# Patient Record
Sex: Female | Born: 1945 | Race: White | Hispanic: No | Marital: Married | Smoking: Never smoker
Health system: Southern US, Community
[De-identification: ages and names within clinical notes are randomized; demographics above are authoritative.]

## PROBLEM LIST (undated history)

## (undated) DIAGNOSIS — R002 Palpitations: Secondary | ICD-10-CM

## (undated) DIAGNOSIS — E039 Hypothyroidism, unspecified: Secondary | ICD-10-CM

## (undated) DIAGNOSIS — R531 Weakness: Secondary | ICD-10-CM

## (undated) DIAGNOSIS — M810 Age-related osteoporosis without current pathological fracture: Secondary | ICD-10-CM

## (undated) DIAGNOSIS — G20C Parkinsonism, unspecified: Secondary | ICD-10-CM

## (undated) DIAGNOSIS — R0609 Other forms of dyspnea: Secondary | ICD-10-CM

## (undated) DIAGNOSIS — R7301 Impaired fasting glucose: Secondary | ICD-10-CM

## (undated) DIAGNOSIS — R739 Hyperglycemia, unspecified: Secondary | ICD-10-CM

## (undated) DIAGNOSIS — G20 Parkinson's disease: Secondary | ICD-10-CM

## (undated) DIAGNOSIS — G20A1 Parkinson's disease without dyskinesia, without mention of fluctuations: Secondary | ICD-10-CM

## (undated) DIAGNOSIS — R0600 Dyspnea, unspecified: Secondary | ICD-10-CM

## (undated) DIAGNOSIS — I493 Ventricular premature depolarization: Secondary | ICD-10-CM

## (undated) DIAGNOSIS — E063 Autoimmune thyroiditis: Secondary | ICD-10-CM

## (undated) DIAGNOSIS — L659 Nonscarring hair loss, unspecified: Secondary | ICD-10-CM

## (undated) HISTORY — DX: Parkinsonism, unspecified: G20.C

## (undated) HISTORY — DX: Hyperglycemia, unspecified: R73.9

## (undated) HISTORY — DX: Ventricular premature depolarization: I49.3

## (undated) HISTORY — DX: Hypothyroidism, unspecified: E03.9

## (undated) HISTORY — DX: Autoimmune thyroiditis: E06.3

## (undated) HISTORY — DX: Parkinson's disease: G20

## (undated) HISTORY — DX: Impaired fasting glucose: R73.01

## (undated) HISTORY — PX: CHOLECYSTECTOMY: SHX55

## (undated) HISTORY — DX: Nonscarring hair loss, unspecified: L65.9

## (undated) HISTORY — DX: Palpitations: R00.2

## (undated) HISTORY — DX: Parkinson's disease without dyskinesia, without mention of fluctuations: G20.A1

## (undated) HISTORY — DX: Age-related osteoporosis without current pathological fracture: M81.0

## (undated) HISTORY — DX: Dyspnea, unspecified: R06.00

## (undated) HISTORY — DX: Weakness: R53.1

## (undated) HISTORY — PX: CATARACT EXTRACTION: SUR2

## (undated) HISTORY — DX: Other forms of dyspnea: R06.09

---

## 1997-03-11 HISTORY — PX: WRIST SURGERY: SHX841

## 2016-01-15 HISTORY — PX: FOOT SURGERY: SHX648

## 2017-01-24 ENCOUNTER — Encounter: Payer: Self-pay | Admitting: *Deleted

## 2017-01-27 ENCOUNTER — Encounter: Payer: Self-pay | Admitting: Neurology

## 2017-01-27 ENCOUNTER — Ambulatory Visit (INDEPENDENT_AMBULATORY_CARE_PROVIDER_SITE_OTHER): Payer: Medicare Other | Admitting: Neurology

## 2017-01-27 ENCOUNTER — Encounter (INDEPENDENT_AMBULATORY_CARE_PROVIDER_SITE_OTHER): Payer: Self-pay

## 2017-01-27 ENCOUNTER — Telehealth: Payer: Self-pay | Admitting: Neurology

## 2017-01-27 DIAGNOSIS — G20 Parkinson's disease: Secondary | ICD-10-CM | POA: Diagnosis not present

## 2017-01-27 MED ORDER — AMANTADINE HCL 100 MG PO CAPS: 100.0000 mg | ORAL_CAPSULE | Freq: Two times a day (BID) | ORAL | 1 refills | Status: DC

## 2017-01-27 MED ORDER — CARBIDOPA-LEVODOPA 25-100 MG PO TABS: 1.0000 | ORAL_TABLET | Freq: Every day | ORAL | 1 refills | Status: DC

## 2017-01-27 NOTE — Progress Notes (Signed)
Reason for visit: Parkinson's disease  Referring physician: Dr. Maryellen Pile is a 71 y.o. female  History of present illness:  Ms. Barsanti is a 71 year old right-handed white female with a history of Parkinson's disease dating back about 9 or 10 years.  The patient believes he was around 71 years old when she was told she had a diagnosis of Parkinson's disease.  The symptoms initially began with a tremor that involved the left foot, and she has also eventually had some tremors involving both hands, right greater than left.  The patient has had a tight sensation in the hand and sloppy handwriting.  The patient has gradually been placed on a multitude of medications for Parkinson's disease including Azilect, amantadine, Sinemet, and Mirapex.  The patient denies any severe problems with walking, she has stumbled on occasion when she has caught her foot on something.  The patient denies any problems with speech or swallowing.  She does note a mild problem with memory.  She has never had MRI evaluation of the brain.  The patient denies any low back pain, but she does have some discomfort in the legs that occurs with sitting, in the past this has improved with walking, but this is no longer the case.  The patient may have some nightmares, occasionally she may fall out of bed.  The neck at times feels tight.  She indicates that her mother also had a tremor similar to hers, but her mother was never diagnosed with Parkinson's disease.  The patient has recently moved from [place] to this area, she seeks a new neurologist.  Past Medical History:  Diagnosis Date  . Dyspnea on exertion   . Hashimoto's thyroiditis   . Hypothyroidism   . Impaired fasting blood sugar   . Palpitations   . Parkinson's disease ([place])   . Parkinsonism ([place])   . Ventricular premature beats     Past Surgical History:  Procedure Laterality Date  . CATARACT EXTRACTION Bilateral   . CESAREAN SECTION     x2  .  CHOLECYSTECTOMY    . FOOT SURGERY Right 01/15/2016    Family History  Problem Relation Age of Onset  . Breast cancer Mother   . Lung disease Brother     Social history:  reports that  has never smoked. she has never used smokeless tobacco. She reports that she does not drink alcohol or use drugs.  Medications:  Prior to Admission medications   Medication Sig Start Date End Date Taking? Authorizing Provider  amantadine (SYMMETREL) 100 MG capsule Take 100 mg 2 (two) times daily by mouth.   Yes [provider]  carbidopa-levodopa (SINEMET IR) 25-100 MG tablet Take 1 tablet 5 (five) times daily by mouth.   Yes [provider]  Carbidopa-Levodopa ER (SINEMET CR) 25-100 MG tablet controlled release Take 1 tablet at bedtime by mouth.   Yes [provider]  levothyroxine (SYNTHROID, LEVOTHROID) 50 MCG tablet Take 50 mcg daily before breakfast by mouth.   Yes [provider]  pramipexole (MIRAPEX) 1.5 MG tablet Take 1.5 mg 3 (three) times daily by mouth.   Yes [provider]  rasagiline (AZILECT) 0.5 MG TABS tablet Take 1 mg daily by mouth.   Yes [provider]     No Known Allergies  ROS:  Out of a complete 14 system review of symptoms, the patient complains only of the following symptoms, and all other reviewed systems are negative.  Tremor  Blood  pressure 115/60, pulse 85, height 5\' 2"  (1.575 m), weight 145 lb (65.8 kg), SpO2 96 %.  Physical Exam  General: The patient is alert and cooperative at the time of the examination.  Eyes: Pupils are equal, round, and reactive to light. Discs are flat bilaterally.  Neck: The neck is supple, no carotid bruits are noted.  Respiratory: The respiratory examination is clear.  Cardiovascular: The cardiovascular examination reveals a regular rate and rhythm, no obvious murmurs or rubs are noted.  Skin: Extremities are without significant edema.  Neurologic Exam  Mental status: The  patient is alert and oriented x 3 at the time of the examination. The patient has apparent normal recent and remote memory, with an apparently normal attention span and concentration ability.  Cranial nerves: Facial symmetry is present. There is good sensation of the face to pinprick and soft touch bilaterally. The strength of the facial muscles and the muscles to head turning and shoulder shrug are normal bilaterally. Speech is well enunciated, no aphasia or dysarthria is noted. Extraocular movements are full. Visual fields are full. The tongue is midline, and the patient has symmetric elevation of the soft palate. No obvious hearing deficits are noted.  The patient appears to have good facial animation.  The patient uses her arms and hands with speaking.  Motor: The motor testing reveals 5 over 5 strength of all 4 extremities. Good symmetric motor tone is noted throughout.  Sensory: Sensory testing is intact to pinprick, soft touch, vibration sensation, and position sense on all 4 extremities. No evidence of extinction is noted.  Coordination: Cerebellar testing reveals good finger-nose-finger and heel-to-shin bilaterally.  Gait and station: Gait is normal.  The patient has good arm swing bilaterally with walking, she is able to arise from a seated position with arms crossed easily.  Tandem gait is normal. Romberg is negative. No drift is seen.  Reflexes: Deep tendon reflexes are symmetric and normal bilaterally. Toes are downgoing bilaterally.   Assessment/Plan:  1.  History of Parkinson's disease  The clinical examination today appears to be normal, I see no evidence of any parkinsonian features.  At times, the symptoms of Parkinson's disease can be completely suppressed with the use of medications, but the fact that this patient appears to be normal after 9 or 10 years of treatment makes the diagnosis of Parkinson's disease suspect.  The patient will go off the Azilect, she will be set up for  a DAT scan.  If the scan is normal, we will taper her off of all of her medications for Parkinson's disease slowly.  The patient will follow-up in 6 months.  The patient will be referred to an internal medicine physician in this area.  Currently she has no primary care physician.   Jill Alexanders MD 01/27/2017 9:17 AM  [facility] [address] [place], [place] [address]  Phone [phone] Fax [phone]

## 2017-01-27 NOTE — Patient Instructions (Signed)
Stop the Azelect now. We will get a DAT scan.

## 2017-01-27 NOTE — Telephone Encounter (Signed)
DAT Scan Patient is ready to be scheduled for DAT Scan No approval is needed . Thanks Hinton Dyer

## 2017-01-29 ENCOUNTER — Telehealth: Payer: Self-pay | Admitting: Radiology

## 2017-02-25 ENCOUNTER — Encounter (HOSPITAL_COMMUNITY): Payer: Self-pay | Admitting: *Deleted

## 2017-02-25 ENCOUNTER — Emergency Department (HOSPITAL_COMMUNITY): Payer: Medicare Other

## 2017-02-25 DIAGNOSIS — E039 Hypothyroidism, unspecified: Secondary | ICD-10-CM | POA: Diagnosis not present

## 2017-02-25 DIAGNOSIS — R531 Weakness: Secondary | ICD-10-CM | POA: Insufficient documentation

## 2017-02-25 DIAGNOSIS — R319 Hematuria, unspecified: Secondary | ICD-10-CM | POA: Diagnosis not present

## 2017-02-25 DIAGNOSIS — R1084 Generalized abdominal pain: Secondary | ICD-10-CM | POA: Diagnosis not present

## 2017-02-25 DIAGNOSIS — J101 Influenza due to other identified influenza virus with other respiratory manifestations: Secondary | ICD-10-CM | POA: Insufficient documentation

## 2017-02-25 DIAGNOSIS — Z79899 Other long term (current) drug therapy: Secondary | ICD-10-CM | POA: Insufficient documentation

## 2017-02-25 DIAGNOSIS — R509 Fever, unspecified: Secondary | ICD-10-CM | POA: Diagnosis present

## 2017-02-25 DIAGNOSIS — G20 Parkinson's disease: Secondary | ICD-10-CM | POA: Insufficient documentation

## 2017-02-25 DIAGNOSIS — J111 Influenza due to unidentified influenza virus with other respiratory manifestations: Secondary | ICD-10-CM | POA: Diagnosis not present

## 2017-02-25 LAB — URINALYSIS, ROUTINE W REFLEX MICROSCOPIC
BACTERIA UA: NONE SEEN
BILIRUBIN URINE: NEGATIVE
Glucose, UA: NEGATIVE mg/dL
KETONES UR: 5 mg/dL — AB
LEUKOCYTES UA: NEGATIVE
NITRITE: NEGATIVE
Protein, ur: NEGATIVE mg/dL
Specific Gravity, Urine: 1.018 (ref 1.005–1.030)
Squamous Epithelial / LPF: NONE SEEN
pH: 6 (ref 5.0–8.0)

## 2017-02-25 LAB — CBC WITH DIFFERENTIAL/PLATELET
Basophils Absolute: 0 10*3/uL (ref 0.0–0.1)
Basophils Relative: 0 %
EOS PCT: 0 %
Eosinophils Absolute: 0 10*3/uL (ref 0.0–0.7)
HCT: 40.9 % (ref 36.0–46.0)
HEMOGLOBIN: 13.6 g/dL (ref 12.0–15.0)
LYMPHS ABS: 0.7 10*3/uL (ref 0.7–4.0)
LYMPHS PCT: 7 %
MCH: 28.9 pg (ref 26.0–34.0)
MCHC: 33.3 g/dL (ref 30.0–36.0)
MCV: 86.8 fL (ref 78.0–100.0)
MONOS PCT: 8 %
Monocytes Absolute: 0.8 10*3/uL (ref 0.1–1.0)
Neutro Abs: 8.6 10*3/uL — ABNORMAL HIGH (ref 1.7–7.7)
Neutrophils Relative %: 85 %
PLATELETS: 201 10*3/uL (ref 150–400)
RBC: 4.71 MIL/uL (ref 3.87–5.11)
RDW: 13.7 % (ref 11.5–15.5)
WBC: 10.1 10*3/uL (ref 4.0–10.5)

## 2017-02-25 LAB — COMPREHENSIVE METABOLIC PANEL
ALBUMIN: 4 g/dL (ref 3.5–5.0)
ALK PHOS: 109 U/L (ref 38–126)
ALT: 26 U/L (ref 14–54)
AST: 25 U/L (ref 15–41)
Anion gap: 10 (ref 5–15)
BUN: 10 mg/dL (ref 6–20)
CHLORIDE: 104 mmol/L (ref 101–111)
CO2: 22 mmol/L (ref 22–32)
CREATININE: 0.78 mg/dL (ref 0.44–1.00)
Calcium: 8.6 mg/dL — ABNORMAL LOW (ref 8.9–10.3)
GFR calc non Af Amer: >60 mL/min (ref >60)
GLUCOSE: 125 mg/dL — AB (ref 65–99)
Potassium: 3.7 mmol/L (ref 3.5–5.1)
SODIUM: 136 mmol/L (ref 135–145)
Total Bilirubin: 0.5 mg/dL (ref 0.3–1.2)
Total Protein: 7 g/dL (ref 6.5–8.1)

## 2017-02-25 LAB — I-STAT CG4 LACTIC ACID, ED: Lactic Acid, Venous: 0.99 mmol/L (ref 0.5–1.9)

## 2017-02-25 LAB — PROTIME-INR
INR: 1.1
Prothrombin Time: 14.1 seconds (ref 11.4–15.2)

## 2017-02-25 NOTE — ED Triage Notes (Signed)
Pt arrives with her family from home.  Family says she has been confused and had a fever 101  Today. Had tylenol for the same.  +cough. She has been confused enough that she forgot to take her parkinson meds "1 or 2 times" and now her shaking is worse and painful.

## 2017-02-26 ENCOUNTER — Emergency Department (HOSPITAL_COMMUNITY)
Admission: EM | Admit: 2017-02-26 | Discharge: 2017-02-26 | Disposition: A | Discharge status: Home / Self Care | Payer: Medicare Other | Attending: Emergency Medicine | Admitting: Emergency Medicine

## 2017-02-26 DIAGNOSIS — R319 Hematuria, unspecified: Secondary | ICD-10-CM

## 2017-02-26 DIAGNOSIS — J101 Influenza due to other identified influenza virus with other respiratory manifestations: Secondary | ICD-10-CM | POA: Diagnosis not present

## 2017-02-26 DIAGNOSIS — R531 Weakness: Secondary | ICD-10-CM

## 2017-02-26 LAB — INFLUENZA PANEL BY PCR (TYPE A & B)
INFLAPCR: POSITIVE — AB
Influenza B By PCR: NEGATIVE

## 2017-02-26 LAB — CBC
HCT: 42.6 % (ref 36.0–46.0)
HEMOGLOBIN: 14.2 g/dL (ref 12.0–15.0)
MCH: 29.5 pg (ref 26.0–34.0)
MCHC: 33.3 g/dL (ref 30.0–36.0)
MCV: 88.4 fL (ref 78.0–100.0)
Platelets: 194 10*3/uL (ref 150–400)
RBC: 4.82 MIL/uL (ref 3.87–5.11)
RDW: 14.1 % (ref 11.5–15.5)
WBC: 7.6 10*3/uL (ref 4.0–10.5)

## 2017-02-26 LAB — I-STAT CG4 LACTIC ACID, ED: Lactic Acid, Venous: 1.36 mmol/L (ref 0.5–1.9)

## 2017-02-26 MED ORDER — SODIUM CHLORIDE 0.9 % IV BOLUS (SEPSIS)
1000.0000 mL | Freq: Once | INTRAVENOUS | Status: AC
  Administered 2017-02-26: 1000 mL via INTRAVENOUS

## 2017-02-26 MED ORDER — CEPHALEXIN 500 MG PO CAPS: 1000.0000 mg | ORAL_CAPSULE | Freq: Two times a day (BID) | ORAL | 0 refills | Status: DC

## 2017-02-26 MED ORDER — OSELTAMIVIR PHOSPHATE 75 MG PO CAPS
75.0000 mg | ORAL_CAPSULE | Freq: Once | ORAL | Status: AC
  Administered 2017-02-26: 75 mg via ORAL
  Filled 2017-02-26: qty 1

## 2017-02-26 MED ORDER — IBUPROFEN 800 MG PO TABS: 800.0000 mg | ORAL_TABLET | Freq: Three times a day (TID) | ORAL | 0 refills | Status: DC

## 2017-02-26 MED ORDER — ACETAMINOPHEN 500 MG PO TABS
1000.0000 mg | ORAL_TABLET | Freq: Once | ORAL | Status: AC
  Administered 2017-02-26: 1000 mg via ORAL
  Filled 2017-02-26: qty 2

## 2017-02-26 MED ORDER — OSELTAMIVIR PHOSPHATE 75 MG PO CAPS: 75.0000 mg | ORAL_CAPSULE | Freq: Two times a day (BID) | ORAL | 0 refills | Status: DC

## 2017-02-26 MED ORDER — ACETAMINOPHEN 500 MG PO TABS: 1000.0000 mg | ORAL_TABLET | Freq: Four times a day (QID) | ORAL | 0 refills | Status: DC | PRN

## 2017-02-26 MED ORDER — SODIUM CHLORIDE 0.9 % IV SOLN
1000.0000 mL | INTRAVENOUS | Status: DC
  Administered 2017-02-26: 1000 mL via INTRAVENOUS

## 2017-02-26 MED ORDER — IBUPROFEN 800 MG PO TABS
800.0000 mg | ORAL_TABLET | Freq: Once | ORAL | Status: AC
Start: 2017-02-26 — End: 2017-02-26
  Administered 2017-02-26: 800 mg via ORAL
  Filled 2017-02-26: qty 1

## 2017-02-26 MED ORDER — DEXTROSE 5 % IV SOLN
1.0000 g | Freq: Once | INTRAVENOUS | Status: AC
  Administered 2017-02-26: 1 g via INTRAVENOUS
  Filled 2017-02-26: qty 10

## 2017-02-26 NOTE — ED Notes (Signed)
ED Provider at bedside. 

## 2017-02-26 NOTE — ED Notes (Signed)
Rechecked pt O2 stats with a different machine. O2 stats stay at 100%.

## 2017-02-26 NOTE — ED Provider Notes (Signed)
[facility] Provider Note   CSN: [id number] Arrival date & time: 02/25/17  2048     History   Chief Complaint Chief Complaint  Patient presents with  . Fever    HPI [facility] [name] is a 71 y.o. female.  HPI Patient has been sick for about 2 days.  Symptoms started with some "tickle" in the throat and dry cough.  She developed fever at home.  T-max 101.  Family members report yesterday evening she showed signs of confusion.  She could not recall if he taken medications and seemed very weak and having difficulty walking.  Patient endorses she has generalized aching all over.  No nausea or vomiting.  No pain burning urgency with urination.  I did note she has red blood cells in the urine.  When asked about that patient reports that once before that seem to be the case and she got treated for a urinary tract infection.  Patient is diagnosed with Parkinson's disease which she has had for reportedly 8 years.  She reports that contributes a lot to certain problems she has with musculoskeletal pain and weakness. Past Medical History:  Diagnosis Date  . Dyspnea on exertion   . Hashimoto's thyroiditis   . Hypothyroidism   . Impaired fasting blood sugar   . Palpitations   . Parkinson's disease ([place])   . Parkinsonism ([place])   . Ventricular premature beats     Patient Active Problem List   Diagnosis Date Noted  . Parkinson's disease ([place]) 01/27/2017    Past Surgical History:  Procedure Laterality Date  . CATARACT EXTRACTION Bilateral   . CESAREAN SECTION     x2  . CHOLECYSTECTOMY    . FOOT SURGERY Right 01/15/2016    OB History    No data available       Home Medications    Prior to Admission medications   Medication Sig Start Date End Date Taking? Authorizing Provider  acetaminophen (TYLENOL) 325 MG tablet Take 650-975 mg by mouth every 6 (six) hours as needed for moderate pain.   Yes [provider]  amantadine (SYMMETREL)  100 MG capsule Take 1 capsule (100 mg total) 2 (two) times daily by mouth. 01/27/17  Yes Kathrynn Ducking, MD  carbidopa-levodopa (SINEMET IR) 25-100 MG tablet Take 1 tablet 5 (five) times daily by mouth. 01/27/17  Yes Kathrynn Ducking, MD  Carbidopa-Levodopa ER (SINEMET CR) 25-100 MG tablet controlled release Take 1 tablet at bedtime by mouth.   Yes [provider]  Carboxymethylcellul-Glycerin (CLEAR EYES FOR DRY EYES) 1-0.25 % SOLN Place 1 drop into both eyes daily as needed (dry eyes).   Yes [provider]  levothyroxine (SYNTHROID, LEVOTHROID) 50 MCG tablet Take 50 mcg daily before breakfast by mouth.   Yes [provider]  pramipexole (MIRAPEX) 1.5 MG tablet Take 1.5 mg 3 (three) times daily by mouth.   Yes [provider]  acetaminophen (TYLENOL) 500 MG tablet Take 2 tablets (1,000 mg total) by mouth every 6 (six) hours as needed. 02/26/17   Charlesetta Shanks, MD  cephALEXin (KEFLEX) 500 MG capsule Take 2 capsules (1,000 mg total) by mouth 2 (two) times daily. 02/26/17   Charlesetta Shanks, MD  ibuprofen (ADVIL,MOTRIN) 800 MG tablet Take 1 tablet (800 mg total) by mouth 3 (three) times daily. 02/26/17   Charlesetta Shanks, MD  oseltamivir (TAMIFLU) 75 MG capsule Take 1 capsule (75 mg total) by mouth every 12 (twelve) hours. 02/26/17   Casia Corti,  Jeannie Done, MD    Family History Family History  Problem Relation Age of Onset  . Breast cancer Mother   . Lung disease Brother     Social History Social History   Tobacco Use  . Smoking status: Never Smoker  . Smokeless tobacco: Never Used  Substance Use Topics  . Alcohol use: No    Frequency: Never  . Drug use: No     Allergies   Patient has no known allergies.   Review of Systems Review of Systems 10 Systems reviewed and are negative for acute change except as noted in the HPI.   Physical Exam Updated Vital Signs BP 113/63   Pulse 89   Temp 98.3 F (36.8 C) (Oral)   Resp 19   SpO2 96%    Physical Exam  Constitutional: She is oriented to person, place, and time. She appears well-developed and well-nourished. No distress.  HENT:  Head: Normocephalic and atraumatic.  Nose: Nose normal.  Mouth/Throat: Oropharynx is clear and moist.  Eyes: Conjunctivae and EOM are normal. Pupils are equal, round, and reactive to light.  Neck: Neck supple.  Cardiovascular: Normal rate, regular rhythm, normal heart sounds and intact distal pulses.  No murmur heard. Pulmonary/Chest: Effort normal and breath sounds normal. No respiratory distress.  Abdominal: Soft. She exhibits no distension. There is no tenderness. There is no guarding.  Musculoskeletal: Normal range of motion. She exhibits no edema or tenderness.  Neurological: She is alert and oriented to person, place, and time. No cranial nerve deficit. She exhibits normal muscle tone. Coordination normal.  Skin: Skin is warm and dry.  Psychiatric: She has a normal mood and affect.  Nursing note and vitals reviewed.    ED Treatments / Results  Labs (all labs ordered are listed, but only abnormal results are displayed) Labs Reviewed  COMPREHENSIVE METABOLIC PANEL - Abnormal; Notable for the following components:      Result Value   Glucose, Bld 125 (*)    Calcium 8.6 (*)    All other components within normal limits  CBC WITH DIFFERENTIAL/PLATELET - Abnormal; Notable for the following components:   Neutro Abs 8.6 (*)    All other components within normal limits  URINALYSIS, ROUTINE W REFLEX MICROSCOPIC - Abnormal; Notable for the following components:   Hgb urine dipstick MODERATE (*)    Ketones, ur 5 (*)    All other components within normal limits  INFLUENZA PANEL BY PCR (TYPE A & B) - Abnormal; Notable for the following components:   Influenza A By PCR POSITIVE (*)    All other components within normal limits  CULTURE, BLOOD (ROUTINE X 2)  CULTURE, BLOOD (ROUTINE X 2)  URINE CULTURE  PROTIME-INR  CBC  I-STAT CG4 LACTIC  ACID, ED  I-STAT CG4 LACTIC ACID, ED  I-STAT CG4 LACTIC ACID, ED  I-STAT CG4 LACTIC ACID, ED    EKG  EKG Interpretation None       Radiology Dg Chest 2 View  Result Date: 02/25/2017 CLINICAL DATA:  Fever.  Altered mental status. EXAM: CHEST  2 VIEW COMPARISON:  None. FINDINGS: The cardiomediastinal contours are normal. The lungs are clear. Pulmonary vasculature is normal. No consolidation, pleural effusion, or pneumothorax. No acute osseous abnormalities are seen. IMPRESSION: No acute pulmonary process. Electronically Signed   By: Jeb Levering M.D.   On: 02/25/2017 22:03    Procedures Procedures (including critical care time)  Medications Ordered in ED Medications  sodium chloride 0.9 % bolus 1,000 mL (0 mLs  Intravenous Stopped 02/26/17 0933)    Followed by  0.9 %  sodium chloride infusion (1,000 mLs Intravenous New Bag/Given 02/26/17 2979)  acetaminophen (TYLENOL) tablet 1,000 mg (1,000 mg Oral Given 02/26/17 0903)  cefTRIAXone (ROCEPHIN) 1 g in dextrose 5 % 50 mL IVPB (0 g Intravenous Stopped 02/26/17 0933)  oseltamivir (TAMIFLU) capsule 75 mg (75 mg Oral Given 02/26/17 1321)  ibuprofen (ADVIL,MOTRIN) tablet 800 mg (800 mg Oral Given 02/26/17 1321)     Initial Impression / Assessment and Plan / ED Course  I have reviewed the triage vital signs and the nursing notes.  Pertinent labs & imaging results that were available during my care of the patient were reviewed by me and considered in my medical decision making (see chart for details).      Final Clinical Impressions(s) / ED Diagnoses   Final diagnoses:  Influenza A  Hematuria, unspecified type  Generalized weakness   Patient feels much better after rehydration and antipyretics.  She is alert and appropriate.  Mental status is clear.  No respiratory distress.  Vital signs stable.  At this time symptoms are consistent with confirmed influenza A.  Patient started on Tamiflu.  She also had finding of hematuria.   At this time etiology is unclear.  Consideration was given to possible UTI manifesting first with hematuria, Rocephin 1 g IV administered.  Patient is counseled on following up on her urine culture to determine if she needs ongoing antibiotics.  Keflex is prescribed to continue tomorrow.  I reviewed all this with the patient and she is alert and appropriate.  I advised I was certainly willing to come back when her husband and son return as they had been very involved in her care earlier.  Patient insisted she would relay all this to her family members and although her son meant well, she felt he was overly enthusiastic in his involvement in her medical care.  Patient is counseled on the nature of influenza and that if symptoms are worsening, she may still require hospitalization, and certainly to return if she was not improving with treatment. ED Discharge Orders        Ordered    oseltamivir (TAMIFLU) 75 MG capsule  Every 12 hours     02/26/17 1315    cephALEXin (KEFLEX) 500 MG capsule  2 times daily     02/26/17 1315    ibuprofen (ADVIL,MOTRIN) 800 MG tablet  3 times daily     02/26/17 1320    acetaminophen (TYLENOL) 500 MG tablet  Every 6 hours PRN     02/26/17 1320       Charlesetta Shanks, MD 02/26/17 1352

## 2017-02-26 NOTE — Discharge Instructions (Signed)
1.  You have influenza A.  Take ibuprofen and Tylenol to control fever and body aches. 2.  You also have blood in your urine which may represent urinary tract infection.  You have been given a dose of Rocephin in the emergency department.  You would be due to start Keflex (your oral antibiotic) tomorrow at 8 AM.  Try to follow-up on the results of your urine culture to determine if you need ongoing antibiotics.  It is most likely, that your symptoms are due to your influenza. 3.  Call your primary care doctor's office tomorrow today to let them know you were seen in the emergency department and should have a recheck as well as requesting a follow-up check on your urine culture results. 4.  Return to the emergency department if your symptoms are worsening.  Many people can be treated at home for influenza but some people get seriously ill and have to be hospitalized.

## 2017-02-26 NOTE — ED Notes (Signed)
Placed with external female catheter.

## 2017-02-27 LAB — URINE CULTURE: CULTURE: NO GROWTH

## 2017-03-02 LAB — CULTURE, BLOOD (ROUTINE X 2)
CULTURE: NO GROWTH
Culture: NO GROWTH

## 2017-03-06 ENCOUNTER — Encounter (HOSPITAL_COMMUNITY): Payer: Medicare Other

## 2017-03-06 ENCOUNTER — Other Ambulatory Visit (HOSPITAL_COMMUNITY): Payer: Self-pay

## 2017-03-07 ENCOUNTER — Ambulatory Visit: Payer: Self-pay | Admitting: Neurology

## 2017-03-20 ENCOUNTER — Ambulatory Visit (HOSPITAL_COMMUNITY)
Admission: RE | Admit: 2017-03-20 | Discharge: 2017-03-20 | Disposition: A | Discharge status: Home / Self Care | Payer: Medicare Other | Source: Ambulatory Visit | Attending: Neurology | Admitting: Neurology

## 2017-03-20 ENCOUNTER — Telehealth: Payer: Self-pay | Admitting: Neurology

## 2017-03-20 DIAGNOSIS — G20 Parkinson's disease: Secondary | ICD-10-CM | POA: Insufficient documentation

## 2017-03-20 MED ORDER — IOFLUPANE I 123 185 MBQ/2.5ML IV SOLN
4.8000 | Freq: Once | INTRAVENOUS | Status: AC
  Administered 2017-03-20: 4.8 via INTRAVENOUS

## 2017-03-20 MED ORDER — IODINE STRONG (LUGOLS) 5 % PO SOLN
0.8000 mL | Freq: Once | ORAL | Status: AC
  Administered 2017-03-20: 0.8 mL via ORAL
  Filled 2017-03-20: qty 0.8

## 2017-03-20 NOTE — Telephone Encounter (Signed)
  I called the patient.  The Dat scan suggested that she does have Parkinson's disease.  The patient reports episodes of shortness of breath and tremors that may occur randomly throughout the day.  She may try cutting back on the Sinemet IR tablets taking 1 4 times a day rather than 1 5 times a day, she is to keep track of the frequency of these events.  The patient indicates that she is on Comtan as well, I do not have this on her list of medications.   DAT scan 03/20/17:  IMPRESSION: Absent radiotracer activity within the posterior striata is consistent with decreased dopamine transporter populations typical of Parkinson's syndrome.

## 2017-04-03 ENCOUNTER — Telehealth: Payer: Self-pay | Admitting: Neurology

## 2017-04-03 NOTE — Telephone Encounter (Signed)
Pt is stating that we can now resend claim that medicare has updated their information as of 03/28/17

## 2017-04-04 NOTE — Telephone Encounter (Signed)
Called and spoke with pt. She stated claim was pertaining to office visit on 01/27/17. Advised this would have to go to our billing department. I will forward to them to get this straightened out for her. She verbalized understanding and appreciation for call.

## 2017-04-08 ENCOUNTER — Telehealth: Payer: Self-pay | Admitting: Neurology

## 2017-04-08 MED ORDER — AMANTADINE HCL 100 MG PO CAPS: 100.0000 mg | ORAL_CAPSULE | Freq: Two times a day (BID) | ORAL | 1 refills | Status: DC

## 2017-04-08 NOTE — Telephone Encounter (Signed)
Rx sent to [facility]

## 2017-04-08 NOTE — Telephone Encounter (Signed)
Pt request refill for amantadine (SYMMETREL) 100 MG capsule 90 day sent to [facility], her insurance has changed, all refills need to go thru [facility].

## 2017-04-22 ENCOUNTER — Telehealth: Payer: Self-pay | Admitting: Neurology

## 2017-04-22 MED ORDER — ENTACAPONE 200 MG PO TABS: ORAL_TABLET | ORAL | 3 refills | Status: DC

## 2017-04-22 NOTE — Telephone Encounter (Signed)
Pt stating she has to take 5 daily

## 2017-04-22 NOTE — Telephone Encounter (Signed)
I will write the prescription for the Comtan.

## 2017-04-22 NOTE — Telephone Encounter (Signed)
Pt calling to inform that her Entacatone 200mg  was left off her medication list, she is in need of a prescription/refill being sent to  [place], [place] [phone] (Phone) [phone] (Fax)   Pt would like to know if she could get this as a 90 day prescription.

## 2017-04-22 NOTE — Addendum Note (Signed)
Addended by: Kathrynn Ducking on: 04/22/2017 04:23 PM   Modules accepted: Orders

## 2017-04-23 DIAGNOSIS — G20 Parkinson's disease: Secondary | ICD-10-CM | POA: Diagnosis not present

## 2017-04-23 DIAGNOSIS — Z1389 Encounter for screening for other disorder: Secondary | ICD-10-CM | POA: Diagnosis not present

## 2017-04-23 DIAGNOSIS — L659 Nonscarring hair loss, unspecified: Secondary | ICD-10-CM | POA: Diagnosis not present

## 2017-04-23 DIAGNOSIS — E039 Hypothyroidism, unspecified: Secondary | ICD-10-CM | POA: Diagnosis not present

## 2017-06-03 DIAGNOSIS — R7309 Other abnormal glucose: Secondary | ICD-10-CM | POA: Diagnosis not present

## 2017-06-03 DIAGNOSIS — Z1389 Encounter for screening for other disorder: Secondary | ICD-10-CM | POA: Diagnosis not present

## 2017-06-03 DIAGNOSIS — Z13220 Encounter for screening for lipoid disorders: Secondary | ICD-10-CM | POA: Diagnosis not present

## 2017-06-03 DIAGNOSIS — Z136 Encounter for screening for cardiovascular disorders: Secondary | ICD-10-CM | POA: Diagnosis not present

## 2017-06-03 DIAGNOSIS — Z0000 Encounter for general adult medical examination without abnormal findings: Secondary | ICD-10-CM | POA: Diagnosis not present

## 2017-06-03 DIAGNOSIS — G20 Parkinson's disease: Secondary | ICD-10-CM | POA: Diagnosis not present

## 2017-06-03 DIAGNOSIS — E039 Hypothyroidism, unspecified: Secondary | ICD-10-CM | POA: Diagnosis not present

## 2017-06-03 DIAGNOSIS — M8588 Other specified disorders of bone density and structure, other site: Secondary | ICD-10-CM | POA: Diagnosis not present

## 2017-06-17 ENCOUNTER — Telehealth: Payer: Self-pay | Admitting: Neurology

## 2017-06-17 MED ORDER — CARBIDOPA-LEVODOPA ER 25-100 MG PO TBCR: 1.0000 | EXTENDED_RELEASE_TABLET | Freq: Every day | ORAL | 3 refills | Status: DC

## 2017-06-17 MED ORDER — CARBIDOPA-LEVODOPA 25-100 MG PO TABS: 1.0000 | ORAL_TABLET | Freq: Every day | ORAL | 1 refills | Status: DC

## 2017-06-17 NOTE — Addendum Note (Signed)
Addended by: Rossie Muskrat L on: 06/17/2017 11:19 AM   Modules accepted: Orders

## 2017-06-17 NOTE — Telephone Encounter (Signed)
Patient requesting new Rx for Carbidopa-Levodopa ER (SINEMET CR) 25-100 MG tablet controlled release 90 day supply called to [facility].

## 2017-06-17 NOTE — Telephone Encounter (Signed)
I sent in refills for the Sinemet IR and the Sinemet CR tablets for 90-day supplies.

## 2017-06-17 NOTE — Addendum Note (Signed)
Addended by: Kathrynn Ducking on: 06/17/2017 04:46 PM   Modules accepted: Orders

## 2017-06-17 NOTE — Telephone Encounter (Signed)
I called pt. She needs refill on Sinemet ER 25-100mg  tablet that she takes daily at night. Dr. Jannifer Franklin has not prescribed this yet. She moved from [place] and established with Dr. Jannifer Franklin. She was using up old rx she had.

## 2017-07-08 DIAGNOSIS — Z961 Presence of intraocular lens: Secondary | ICD-10-CM | POA: Diagnosis not present

## 2017-07-08 DIAGNOSIS — H524 Presbyopia: Secondary | ICD-10-CM | POA: Diagnosis not present

## 2017-07-08 DIAGNOSIS — H532 Diplopia: Secondary | ICD-10-CM | POA: Diagnosis not present

## 2017-07-10 DIAGNOSIS — E039 Hypothyroidism, unspecified: Secondary | ICD-10-CM | POA: Diagnosis not present

## 2017-07-10 DIAGNOSIS — L659 Nonscarring hair loss, unspecified: Secondary | ICD-10-CM | POA: Diagnosis not present

## 2017-07-15 DIAGNOSIS — L650 Telogen effluvium: Secondary | ICD-10-CM | POA: Diagnosis not present

## 2017-07-28 ENCOUNTER — Encounter: Payer: Self-pay | Admitting: Neurology

## 2017-07-28 ENCOUNTER — Ambulatory Visit (INDEPENDENT_AMBULATORY_CARE_PROVIDER_SITE_OTHER): Payer: Medicare Other | Admitting: Neurology

## 2017-07-28 VITALS — BP 117/73 | HR 77 | Wt 143.0 lb

## 2017-07-28 DIAGNOSIS — G20 Parkinson's disease: Secondary | ICD-10-CM | POA: Diagnosis not present

## 2017-07-28 MED ORDER — PRAMIPEXOLE DIHYDROCHLORIDE 1.5 MG PO TABS: 1.5000 mg | ORAL_TABLET | Freq: Three times a day (TID) | ORAL | 3 refills | Status: DC

## 2017-07-28 NOTE — Progress Notes (Signed)
Reason for visit: Parkinson's disease  Amy [name] is an 72 y.o. female  History of present illness:  Amy [name] is a 72 year old right-handed white female with a history of Parkinson's disease over the last 10 years.  The patient has very minimal signs of parkinsonism, for this reason she underwent a DAT scan that did in fact confirm the presence of Parkinson's disease.  Since last seen, she has not had any significant change in her clinical condition.  The patient will occasionally get episodes where she will feel drained of energy, nervous and jittery.  She does not completely correlate this with dosing of medication, if she eats something she finds that this will improve her condition.  At times, the event may last several hours.  The patient feels somewhat heavy with the legs, but she still is able to get around, she is not frozen.  She has also noted some mild changes in her memory over the last 6 months.  She has become more forgetful.  She currently is off of Azilect.  She denies any problems with swallowing, she has had one fall since last seen him of this occurred 2 months ago when she tripped over something.  The patient is sleeping fairly well.  She returns for an evaluation.  Past Medical History:  Diagnosis Date  . Dyspnea on exertion   . Hashimoto's thyroiditis   . Hypothyroidism   . Impaired fasting blood sugar   . Palpitations   . Parkinson's disease ([place])   . Parkinsonism ([place])   . Ventricular premature beats     Past Surgical History:  Procedure Laterality Date  . CATARACT EXTRACTION Bilateral   . CESAREAN SECTION     x2  . CHOLECYSTECTOMY    . FOOT SURGERY Right 01/15/2016    Family History  Problem Relation Age of Onset  . Breast cancer Mother   . Lung disease Brother     Social history:  reports that she has never smoked. She has never used smokeless tobacco. She reports that she does not drink alcohol or use drugs.   No Known Allergies  Medications:    Prior to Admission medications   Medication Sig Start Date End Date Taking? Authorizing Provider  acetaminophen (TYLENOL) 325 MG tablet Take 650-975 mg by mouth every 6 (six) hours as needed for moderate pain.   Yes [provider]  acetaminophen (TYLENOL) 500 MG tablet Take 2 tablets (1,000 mg total) by mouth every 6 (six) hours as needed. 02/26/17  Yes Charlesetta Shanks, MD  amantadine (SYMMETREL) 100 MG capsule Take 1 capsule (100 mg total) by mouth 2 (two) times daily. 04/08/17  Yes Kathrynn Ducking, MD  carbidopa-levodopa (SINEMET IR) 25-100 MG tablet Take 1 tablet by mouth 5 (five) times daily. 06/17/17  Yes Kathrynn Ducking, MD  Carbidopa-Levodopa ER (SINEMET CR) 25-100 MG tablet controlled release Take 1 tablet by mouth at bedtime. 06/17/17  Yes Kathrynn Ducking, MD  Carboxymethylcellul-Glycerin (CLEAR EYES FOR DRY EYES) 1-0.25 % SOLN Place 1 drop into both eyes daily as needed (dry eyes).   Yes [provider]  entacapone (COMTAN) 200 MG tablet 1 tablet 5 times daily with Sinemet 04/22/17  Yes Kathrynn Ducking, MD  ibuprofen (ADVIL,MOTRIN) 800 MG tablet Take 1 tablet (800 mg total) by mouth 3 (three) times daily. 02/26/17  Yes Pfeiffer, Jeannie Done, MD  levothyroxine (SYNTHROID, LEVOTHROID) 50 MCG tablet Take 50 mcg daily before breakfast by mouth.   Yes [provider]  pramipexole (MIRAPEX) 1.5 MG tablet Take 1 tablet (1.5 mg total) by mouth 3 (three) times daily. 07/28/17  Yes Kathrynn Ducking, MD    ROS:  Out of a complete 14 system review of symptoms, the patient complains only of the following symptoms, and all other reviewed systems are negative.  Frequency of urination Memory loss, tremors Decreased concentration, anxiety Sleep talking  Blood pressure 117/73, pulse 77, weight 143 lb (64.9 kg).  Physical Exam  General: The patient is alert and cooperative at the time of the examination.  Skin: No significant peripheral edema is noted.   Neurologic  Exam  Mental status: The patient is alert and oriented x 3 at the time of the examination. The patient has apparent normal recent and remote memory, with an apparently normal attention span and concentration ability.   Cranial nerves: Facial symmetry is present. Speech is normal, no aphasia or dysarthria is noted. Extraocular movements are full. Visual fields are full.  Minimal masking of the face is seen.  Motor: The patient has good strength in all 4 extremities.  Sensory examination: Soft touch sensation is symmetric on the face, arms, and legs.  Coordination: The patient has good finger-nose-finger and heel-to-shin bilaterally.  Gait and station: The patient has a normal gait.  The patient is able to rise from a seated position with arms crossed.  She has good stride, good arm swing bilaterally.  Tandem gait is normal. Romberg is negative. No drift is seen.  Reflexes: Deep tendon reflexes are symmetric.   Assessment/Plan:  1.  Parkinson's disease  2.  Reported memory disturbance  We will need to follow the memory issues over time, the patient will continue her on her current medication regimen, a prescription for Mirapex was sent in.  She will follow-up in 6 months.  She has done remarkably well with her Parkinson's disease over the last 10 years.  Jill Alexanders MD 07/28/2017 12:11 PM  [facility] [address] [place], [place] [address]  Phone [phone] Fax [phone]

## 2017-07-30 DIAGNOSIS — M810 Age-related osteoporosis without current pathological fracture: Secondary | ICD-10-CM | POA: Diagnosis not present

## 2017-07-30 DIAGNOSIS — M8588 Other specified disorders of bone density and structure, other site: Secondary | ICD-10-CM | POA: Diagnosis not present

## 2017-08-05 DIAGNOSIS — H5034 Intermittent alternating exotropia: Secondary | ICD-10-CM | POA: Diagnosis not present

## 2017-08-05 DIAGNOSIS — H5021 Vertical strabismus, right eye: Secondary | ICD-10-CM | POA: Diagnosis not present

## 2017-08-20 DIAGNOSIS — M810 Age-related osteoporosis without current pathological fracture: Secondary | ICD-10-CM | POA: Diagnosis not present

## 2017-08-20 DIAGNOSIS — E039 Hypothyroidism, unspecified: Secondary | ICD-10-CM | POA: Diagnosis not present

## 2017-09-12 DIAGNOSIS — E039 Hypothyroidism, unspecified: Secondary | ICD-10-CM | POA: Diagnosis not present

## 2017-10-13 DIAGNOSIS — R768 Other specified abnormal immunological findings in serum: Secondary | ICD-10-CM | POA: Diagnosis not present

## 2017-10-13 DIAGNOSIS — L659 Nonscarring hair loss, unspecified: Secondary | ICD-10-CM | POA: Diagnosis not present

## 2017-10-13 DIAGNOSIS — E039 Hypothyroidism, unspecified: Secondary | ICD-10-CM | POA: Diagnosis not present

## 2017-10-13 DIAGNOSIS — E063 Autoimmune thyroiditis: Secondary | ICD-10-CM | POA: Diagnosis not present

## 2017-10-17 ENCOUNTER — Other Ambulatory Visit: Payer: Self-pay | Admitting: Neurology

## 2017-10-21 DIAGNOSIS — Z0000 Encounter for general adult medical examination without abnormal findings: Secondary | ICD-10-CM | POA: Diagnosis not present

## 2017-11-05 DIAGNOSIS — L650 Telogen effluvium: Secondary | ICD-10-CM | POA: Diagnosis not present

## 2017-11-05 DIAGNOSIS — L659 Nonscarring hair loss, unspecified: Secondary | ICD-10-CM | POA: Diagnosis not present

## 2017-11-18 DIAGNOSIS — R1032 Left lower quadrant pain: Secondary | ICD-10-CM | POA: Diagnosis not present

## 2017-11-20 DIAGNOSIS — H5034 Intermittent alternating exotropia: Secondary | ICD-10-CM | POA: Diagnosis not present

## 2017-11-20 DIAGNOSIS — H5021 Vertical strabismus, right eye: Secondary | ICD-10-CM | POA: Diagnosis not present

## 2017-12-02 DIAGNOSIS — R109 Unspecified abdominal pain: Secondary | ICD-10-CM | POA: Diagnosis not present

## 2017-12-22 DIAGNOSIS — R159 Full incontinence of feces: Secondary | ICD-10-CM | POA: Diagnosis not present

## 2017-12-22 DIAGNOSIS — R152 Fecal urgency: Secondary | ICD-10-CM | POA: Diagnosis not present

## 2017-12-22 DIAGNOSIS — N3000 Acute cystitis without hematuria: Secondary | ICD-10-CM | POA: Diagnosis not present

## 2017-12-22 DIAGNOSIS — R8290 Unspecified abnormal findings in urine: Secondary | ICD-10-CM | POA: Diagnosis not present

## 2017-12-22 DIAGNOSIS — R150 Incomplete defecation: Secondary | ICD-10-CM | POA: Diagnosis not present

## 2017-12-23 ENCOUNTER — Encounter: Payer: Self-pay | Admitting: Podiatry

## 2017-12-23 ENCOUNTER — Ambulatory Visit (INDEPENDENT_AMBULATORY_CARE_PROVIDER_SITE_OTHER): Payer: Medicare Other

## 2017-12-23 ENCOUNTER — Ambulatory Visit (INDEPENDENT_AMBULATORY_CARE_PROVIDER_SITE_OTHER): Payer: Medicare Other | Admitting: Podiatry

## 2017-12-23 DIAGNOSIS — L84 Corns and callosities: Secondary | ICD-10-CM

## 2017-12-23 DIAGNOSIS — B351 Tinea unguium: Secondary | ICD-10-CM

## 2017-12-23 DIAGNOSIS — M79674 Pain in right toe(s): Secondary | ICD-10-CM

## 2017-12-23 DIAGNOSIS — M2041 Other hammer toe(s) (acquired), right foot: Secondary | ICD-10-CM

## 2017-12-23 DIAGNOSIS — M79675 Pain in left toe(s): Secondary | ICD-10-CM | POA: Diagnosis not present

## 2017-12-23 NOTE — Progress Notes (Signed)
   Subjective:    Patient ID: Amy [name], female    DOB: Sep 29, 1945, 72 y.o.   MRN: [id number]  HPI    Review of Systems  All other systems reviewed and are negative.      Objective:   Physical Exam        Assessment & Plan:

## 2017-12-31 NOTE — Progress Notes (Signed)
Subjective:   Patient ID: Amy [name], female   DOB: 72 y.o.   MRN: [id number]   HPI patient presents stating that she has digital deformities with a painful corn callus that makes it difficult for her to wear shoe gear comfortably.  States that also her nails are quite thickened and painful and she cannot wear shoe gear without difficulty.  States this is been going on a long time getting worse over time and patient does not smoke and likes to be active    Review of Systems  All other systems reviewed and are negative.       Objective:  Physical Exam  Constitutional: She appears well-developed and well-nourished.  Cardiovascular: Intact distal pulses.  Pulmonary/Chest: Effort normal.  Musculoskeletal: Normal range of motion.  Neurological: She is alert.  Skin: Skin is warm.  Nursing note and vitals reviewed.   Neurovascular status was found to be mildly diminished but intact with patient having normal muscle strength range of motion for age.  Patient has digital deformities digit to bilateral with dorsal keratotic lesion formation that are painful and thick yellow brittle nailbeds 1-5 both feet that are difficult to wear shoe gear with at this time.  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  At this point I recommended wider shoes softer materials and I debrided nailbeds 1-5 both feet which will be done on a routine basis.  Patient is given instructions to come in earlier if symptoms indicate  X-rays right indicate elevation of the second digit with deformity of the toe and structural bunion deformity noted

## 2018-01-03 DIAGNOSIS — Z23 Encounter for immunization: Secondary | ICD-10-CM | POA: Diagnosis not present

## 2018-01-08 ENCOUNTER — Ambulatory Visit
Admission: RE | Admit: 2018-01-08 | Discharge: 2018-01-08 | Disposition: A | Discharge status: Home / Self Care | Payer: Medicare Other | Source: Ambulatory Visit | Attending: Gastroenterology | Admitting: Gastroenterology

## 2018-01-08 ENCOUNTER — Other Ambulatory Visit: Payer: Self-pay | Admitting: Gastroenterology

## 2018-01-08 DIAGNOSIS — R110 Nausea: Secondary | ICD-10-CM | POA: Diagnosis not present

## 2018-01-08 DIAGNOSIS — R198 Other specified symptoms and signs involving the digestive system and abdomen: Secondary | ICD-10-CM

## 2018-01-08 DIAGNOSIS — D72829 Elevated white blood cell count, unspecified: Secondary | ICD-10-CM | POA: Diagnosis not present

## 2018-01-08 DIAGNOSIS — R159 Full incontinence of feces: Secondary | ICD-10-CM | POA: Diagnosis not present

## 2018-01-08 DIAGNOSIS — R194 Change in bowel habit: Secondary | ICD-10-CM | POA: Diagnosis not present

## 2018-01-28 ENCOUNTER — Other Ambulatory Visit: Payer: Self-pay

## 2018-01-28 ENCOUNTER — Encounter

## 2018-01-28 ENCOUNTER — Encounter: Payer: Self-pay | Admitting: Neurology

## 2018-01-28 ENCOUNTER — Ambulatory Visit (INDEPENDENT_AMBULATORY_CARE_PROVIDER_SITE_OTHER): Payer: Medicare Other | Admitting: Neurology

## 2018-01-28 VITALS — BP 112/64 | HR 86 | Resp 16 | Ht 62.0 in | Wt 140.0 lb

## 2018-01-28 DIAGNOSIS — G20 Parkinson's disease: Secondary | ICD-10-CM | POA: Diagnosis not present

## 2018-01-28 MED ORDER — RIVASTIGMINE TARTRATE 1.5 MG PO CAPS: ORAL_CAPSULE | ORAL | 3 refills | Status: DC

## 2018-01-28 MED ORDER — RASAGILINE MESYLATE 0.5 MG PO TABS: 0.5000 mg | ORAL_TABLET | Freq: Every day | ORAL | 1 refills | Status: DC

## 2018-01-28 NOTE — Progress Notes (Signed)
Reason for visit: Parkinson's disease  Amy [name] is an 72 y.o. female  History of present illness:  Amy [name] is a 72 year old right-handed white female with a history of Parkinson's disease primarily with left-sided features.  The patient has also noted some problems with memory that has come on within the last year.  The patient claims that her memory is gradually worsening.  She is starting to have some occasional visual hallucinations, she may see someone out in the yard who is not there.  The patient does have vivid dreams at night.  She has also noted increased tremors involving the left leg.  The patient also reports episodes that may occur almost daily where she may have a freezing episode, her feet make shuffle before she can get started with her walking, along the same timeframe she may have what feels like a panic attack with shortness of breath and tremulousness inside her body.  The patient otherwise is sleeping fairly well.  The episodes of freezing and shortness of breath have occurred off and on over the last 6 months.  The patient reports dry mouth that has been significant, she may have a Novocain type feeling in the mouth at times and burning.  The patient may have some discomfort in the calf muscles bilaterally that may come and go.  She returns this office for an evaluation.  Past Medical History:  Diagnosis Date  . Dyspnea on exertion   . Hashimoto's thyroiditis   . Hypothyroidism   . Impaired fasting blood sugar   . Palpitations   . Parkinson's disease ([place])   . Parkinsonism ([place])   . Ventricular premature beats     Past Surgical History:  Procedure Laterality Date  . CATARACT EXTRACTION Bilateral   . CESAREAN SECTION     x2  . CHOLECYSTECTOMY    . FOOT SURGERY Right 01/15/2016    Family History  Problem Relation Age of Onset  . Breast cancer Mother   . Lung disease Brother     Social history:  reports that she has never smoked. She has never used  smokeless tobacco. She reports that she does not drink alcohol or use drugs.    Allergies  Allergen Reactions  . Ciprofloxacin Rash    Medications:  Prior to Admission medications   Medication Sig Start Date End Date Taking? Authorizing Provider  acetaminophen (TYLENOL) 325 MG tablet Take 650-975 mg by mouth every 6 (six) hours as needed for moderate pain.   Yes [provider]  acetaminophen (TYLENOL) 500 MG tablet Take 2 tablets (1,000 mg total) by mouth every 6 (six) hours as needed. 02/26/17  Yes Charlesetta Shanks, MD  alendronate (FOSAMAX) 70 MG tablet TAKE 1 TABLET BY MOUTH ONE TIME PER WEEK 01/05/18  Yes [provider]  amantadine (SYMMETREL) 100 MG capsule TAKE 1 CAPSULE TWICE A DAY 10/17/17  Yes Kathrynn Ducking, MD  carbidopa-levodopa (SINEMET IR) 25-100 MG tablet Take 1 tablet by mouth 5 (five) times daily. 06/17/17  Yes Kathrynn Ducking, MD  Carbidopa-Levodopa ER (SINEMET CR) 25-100 MG tablet controlled release Take 1 tablet by mouth at bedtime. 06/17/17  Yes Kathrynn Ducking, MD  Carboxymethylcellul-Glycerin (CLEAR EYES FOR DRY EYES) 1-0.25 % SOLN Place 1 drop into both eyes daily as needed (dry eyes).   Yes [provider]  entacapone (COMTAN) 200 MG tablet 1 tablet 5 times daily with Sinemet 04/22/17  Yes Kathrynn Ducking, MD  levothyroxine (SYNTHROID, LEVOTHROID) 50 MCG tablet  Take 50 mcg daily before breakfast by mouth.   Yes [provider]  pramipexole (MIRAPEX) 1.5 MG tablet Take 1 tablet (1.5 mg total) by mouth 3 (three) times daily. 07/28/17  Yes Kathrynn Ducking, MD    ROS:  Out of a complete 14 system review of symptoms, the patient complains only of the following symptoms, and all other reviewed systems are negative.  Memory difficulty Walking problems Tremor  Blood pressure 112/64, pulse 86, resp. rate 16, height 5\' 2"  (1.575 m), weight 140 lb (63.5 kg).  Physical Exam  General: The patient is alert and cooperative at the  time of the examination.  Skin: No significant peripheral edema is noted.   Neurologic Exam  Mental status: The patient is alert and oriented x 3 at the time of the examination. The Mini-Mental status examination done today shows a total score 27/30.  Patient is able to name 8 four-legged animals in 1 minute.   Cranial nerves: Facial symmetry is present. Speech is normal, no aphasia or dysarthria is noted. Extraocular movements are full. Visual fields are full.  Motor: The patient has good strength in all 4 extremities.  Sensory examination: Soft touch sensation is symmetric on the face, arms, and legs.  Coordination: The patient has good finger-nose-finger and heel-to-shin bilaterally.  Occasional resting tremors noted with the left lower extremity.  Gait and station: The patient has the ability to arise from a seated position with arms crossed.  Once up, patient walk independently, she has good stride and good turns, decreased arm swing on the left.  She is slightly stooped.  Tandem gait is unremarkable.  Romberg is negative. No drift is seen.  Reflexes: Deep tendon reflexes are symmetric.   Assessment/Plan:  1.  Parkinson's disease  2.  Reported memory disturbance  The patient will be started on low-dose Exelon taking 1.5 mg capsule twice daily for 2 weeks and go to 3 mg twice daily.  She will be placed back on her Azilect taking 0.5 mg daily.  If the freezing episodes continue, we may increase the Sinemet dosing to the 25/250 mg tablet and have her take 1/2 tablet 5 times daily with the Comtan.  She will remain on the Mirapex for now.  It is not clear why she is on amantadine, this may be discontinued in the future.  She will follow-up in 6 months.  Jill Alexanders MD 01/28/2018 10:28 AM  [facility] [address] [place],  [address]  Phone [phone] Fax [phone]

## 2018-01-28 NOTE — Patient Instructions (Signed)
Restart Azilect 0.5 mg daily, call in 4 weeks if the freezing spells are not better.  We will start exelon 1.5 mg capsules for the memory.

## 2018-01-29 ENCOUNTER — Encounter: Payer: Self-pay | Admitting: *Deleted

## 2018-01-29 DIAGNOSIS — R413 Other amnesia: Secondary | ICD-10-CM | POA: Insufficient documentation

## 2018-02-09 DIAGNOSIS — K635 Polyp of colon: Secondary | ICD-10-CM | POA: Diagnosis not present

## 2018-02-09 DIAGNOSIS — R159 Full incontinence of feces: Secondary | ICD-10-CM | POA: Diagnosis not present

## 2018-02-09 DIAGNOSIS — D120 Benign neoplasm of cecum: Secondary | ICD-10-CM | POA: Diagnosis not present

## 2018-02-09 DIAGNOSIS — K5730 Diverticulosis of large intestine without perforation or abscess without bleeding: Secondary | ICD-10-CM | POA: Diagnosis not present

## 2018-02-09 DIAGNOSIS — R194 Change in bowel habit: Secondary | ICD-10-CM | POA: Diagnosis not present

## 2018-02-11 DIAGNOSIS — D120 Benign neoplasm of cecum: Secondary | ICD-10-CM | POA: Diagnosis not present

## 2018-02-11 DIAGNOSIS — K635 Polyp of colon: Secondary | ICD-10-CM | POA: Diagnosis not present

## 2018-02-25 DIAGNOSIS — Z86010 Personal history of colonic polyps: Secondary | ICD-10-CM | POA: Diagnosis not present

## 2018-02-25 DIAGNOSIS — R194 Change in bowel habit: Secondary | ICD-10-CM | POA: Diagnosis not present

## 2018-02-26 ENCOUNTER — Telehealth: Payer: Self-pay | Admitting: Neurology

## 2018-02-26 MED ORDER — RIVASTIGMINE TARTRATE 1.5 MG PO CAPS: 1.5000 mg | ORAL_CAPSULE | Freq: Two times a day (BID) | ORAL | 1 refills | Status: DC

## 2018-02-26 NOTE — Addendum Note (Signed)
Addended by: Kathrynn Ducking on: 02/26/2018 03:10 PM   Modules accepted: Orders

## 2018-02-26 NOTE — Telephone Encounter (Addendum)
I contacted the patient. She states on 02/24/2018 she increase her Exelon 1.5 mg tablets to 2 tablets daily. She states  after taking the increased dosage she started to feel horrible. Pateint reports she has had terrible body shakes and some SOB. Pateint did not take the medication today (12/19) or on 12/18 and felt somewhat better.  Patient wanted MD to be advised of these symptoms and wanted to verify if she should continue the 2 tablets daily or revert back to 1 tablet daily.

## 2018-02-26 NOTE — Telephone Encounter (Signed)
I called the patient.  The patient is unable to tolerate 3 mg of Exelon twice daily, she was able to tolerate 1.5 mg twice daily.  She will stay on this dose at this time.

## 2018-02-26 NOTE — Telephone Encounter (Signed)
Pt states on 2nd dosage of the rivastigmine (EXELON) 1.5 MG capsule she started feeling terrible and would like a call back to discuss

## 2018-03-06 NOTE — Telephone Encounter (Signed)
I called pt to discuss. No answer, left a message asking her to call me back. 

## 2018-03-06 NOTE — Telephone Encounter (Signed)
Pt called, she needs clarification on how long to continue with 1.5 mg?  Should she try 3mg  again. She is aware Dr Jannifer Franklin is out of the office, this can wait until his return. Please call to advise

## 2018-03-09 NOTE — Telephone Encounter (Signed)
I called pt. She is doing well with exelon 1.5mg  BID. She feels that she may have "jumped the gun" on the side effects of taking exelon 3mg  BID and would be willing to try it again, if Dr. Jannifer Franklin thinks this is advisable.

## 2018-03-09 NOTE — Telephone Encounter (Signed)
I called the patient.  The patient believes that she may be able to tolerate 3 mg Exelon dosing twice daily, she does have a prescription for the 1.5 mg capsules, she will double the dose, if she can tolerate this, I will call in a new prescription.

## 2018-03-24 DIAGNOSIS — H524 Presbyopia: Secondary | ICD-10-CM | POA: Diagnosis not present

## 2018-03-24 DIAGNOSIS — Z961 Presence of intraocular lens: Secondary | ICD-10-CM | POA: Diagnosis not present

## 2018-03-24 DIAGNOSIS — H35372 Puckering of macula, left eye: Secondary | ICD-10-CM | POA: Diagnosis not present

## 2018-04-05 ENCOUNTER — Other Ambulatory Visit: Payer: Self-pay | Admitting: Neurology

## 2018-04-14 ENCOUNTER — Other Ambulatory Visit: Payer: Self-pay | Admitting: Neurology

## 2018-04-20 ENCOUNTER — Telehealth: Payer: Self-pay | Admitting: Neurology

## 2018-04-20 MED ORDER — ENTACAPONE 200 MG PO TABS: ORAL_TABLET | ORAL | 4 refills | Status: DC

## 2018-04-20 NOTE — Telephone Encounter (Signed)
The patient was converted to the 25/250 mg tablet of Sinemet taking 1/2 tablet 5 times daily with Comtan, full tablet 200 mg 5 times daily.  The patient was never placed on one half of a Comtan tablet 5 times daily.  The prescription will need to be corrected.

## 2018-04-20 NOTE — Telephone Encounter (Signed)
I contacted the pt and advised per Dr. Jannifer Franklin last o/v on 01/28/18 he suggested we may change the comtan dosage to 0.5 tablets 5 times per day.. Pt states she has not changed the dosage yet, but has a f/u scheduled with Dr. Jannifer Franklin tomorrow. Pt will continue the 1 tablet 5 times daily until her o/v with Dr. Eugenie Birks.

## 2018-04-20 NOTE — Telephone Encounter (Signed)
Pt states what she got in the mail from [place] was Entacapone 200 MG  225 pills and it states she is to take one half  Of a pill 5 times a day.  Pt states the prescription was supposed to be for 450 tablets and for her to take 5 a day for 90 day supply.  Please call

## 2018-04-20 NOTE — Addendum Note (Signed)
Addended by: Kathrynn Ducking on: 04/20/2018 05:24 PM   Modules accepted: Orders

## 2018-04-21 ENCOUNTER — Ambulatory Visit (INDEPENDENT_AMBULATORY_CARE_PROVIDER_SITE_OTHER): Payer: Medicare Other | Admitting: Neurology

## 2018-04-21 ENCOUNTER — Encounter: Payer: Self-pay | Admitting: Neurology

## 2018-04-21 VITALS — BP 102/64 | HR 86 | Ht 62.0 in | Wt 143.0 lb

## 2018-04-21 DIAGNOSIS — R413 Other amnesia: Secondary | ICD-10-CM

## 2018-04-21 DIAGNOSIS — G20 Parkinson's disease: Secondary | ICD-10-CM

## 2018-04-21 NOTE — Progress Notes (Signed)
Reason for visit: Parkinson's disease  Amy [name] is an 73 y.o. female  History of present illness:  Amy [name] is a 73 year old right-handed white female with a history of Parkinson's disease.  The patient has developed a memory disturbance as well, she was placed on Exelon capsules but could not tolerate the drug and she stopped the medication.  The patient has had increasing problems with hallucinations that are primarily visual in nature.  The patient will see people about the house, the episodes were occasional over the last several months but over the last 2 weeks they have become daily in nature and are becoming more of a problem.  The patient is on amantadine taking 100 mg twice daily, she is on Sinemet CR 25/100 mg at bedtime and she takes the IR Sinemet 25 100 with Comtan 5 times daily.  The patient takes Mirapex 1.5 mg 3 times daily and she is on Azilect 0.5 mg daily.  The patient has not had any falls, she is not very active unfortunately.  She denies any issues swallowing.  She does have vivid dreams at night at times.  She returns to this office for an evaluation.  Past Medical History:  Diagnosis Date  . Dyspnea on exertion   . Hashimoto's thyroiditis   . Hypothyroidism   . Impaired fasting blood sugar   . Palpitations   . Parkinson's disease ([place])   . Parkinsonism ([place])   . Ventricular premature beats     Past Surgical History:  Procedure Laterality Date  . CATARACT EXTRACTION Bilateral   . CESAREAN SECTION     x2  . CHOLECYSTECTOMY    . FOOT SURGERY Right 01/15/2016    Family History  Problem Relation Age of Onset  . Breast cancer Mother   . Lung disease Brother     Social history:  reports that she has never smoked. She has never used smokeless tobacco. She reports that she does not drink alcohol or use drugs.    Allergies  Allergen Reactions  . Ciprofloxacin Rash    Medications:  Prior to Admission medications   Medication Sig Start Date End Date  Taking? Authorizing Provider  acetaminophen (TYLENOL) 325 MG tablet Take 650-975 mg by mouth every 6 (six) hours as needed for moderate pain.   Yes [provider]  acetaminophen (TYLENOL) 500 MG tablet Take 2 tablets (1,000 mg total) by mouth every 6 (six) hours as needed. 02/26/17  Yes Charlesetta Shanks, MD  alendronate (FOSAMAX) 70 MG tablet TAKE 1 TABLET BY MOUTH ONE TIME PER WEEK 01/05/18  Yes [provider]  amantadine (SYMMETREL) 100 MG capsule TAKE 1 CAPSULE TWICE A DAY 10/17/17  Yes Kathrynn Ducking, MD  carbidopa-levodopa (SINEMET IR) 25-100 MG tablet TAKE 1 TABLET FIVE TIMES A DAY 04/06/18  Yes Kathrynn Ducking, MD  Carbidopa-Levodopa ER (SINEMET CR) 25-100 MG tablet controlled release Take 1 tablet by mouth at bedtime. 06/17/17  Yes Kathrynn Ducking, MD  Carboxymethylcellul-Glycerin (CLEAR EYES FOR DRY EYES) 1-0.25 % SOLN Place 1 drop into both eyes daily as needed (dry eyes).   Yes [provider]  entacapone (COMTAN) 200 MG tablet 1 tablet 5 times daily with Sinemet 04/20/18  Yes Kathrynn Ducking, MD  levothyroxine (SYNTHROID, LEVOTHROID) 50 MCG tablet Take 50 mcg daily before breakfast by mouth.   Yes [provider]  pramipexole (MIRAPEX) 1.5 MG tablet Take 1 tablet (1.5 mg total) by mouth 3 (three) times daily. 07/28/17  Yes  Kathrynn Ducking, MD  rasagiline (AZILECT) 0.5 MG TABS tablet Take 1 tablet (0.5 mg total) by mouth daily. 01/28/18  Yes Kathrynn Ducking, MD  rivastigmine (EXELON) 1.5 MG capsule Take 1 capsule (1.5 mg total) by mouth 2 (two) times daily. Patient not taking: Reported on 04/21/2018 02/26/18   Kathrynn Ducking, MD    ROS:  Out of a complete 14 system review of symptoms, the patient complains only of the following symptoms, and all other reviewed systems are negative.  Hallucinations Memory disturbance  Blood pressure 102/64, pulse 86, height 5\' 2"  (1.575 m), weight 143 lb (64.9 kg), SpO2 98 %.  Physical Exam  General:  The patient is alert and cooperative at the time of the examination.  Skin: No significant peripheral edema is noted.   Neurologic Exam  Mental status: The patient is alert and oriented x 3 at the time of the examination. The Mini-Mental status examination done today shows a total score 29/30.   Cranial nerves: Facial symmetry is present. Speech is normal, no aphasia or dysarthria is noted. Extraocular movements are full. Visual fields are full.  Masking of the face is seen.  Motor: The patient has good strength in all 4 extremities.  Sensory examination: Soft touch sensation is symmetric on the face, arms, and legs.  Coordination: The patient has good finger-nose-finger and heel-to-shin bilaterally.  Resting tremors are seen with the legs while sitting with the legs dangling, left greater than right.  Gait and station: The patient has some difficulty arising from a seated position with arms crossed, but eventually she can do it.  With ambulation, she has a stooped posture, she has bilateral arm swing, some gait instability, stumbles with walking.  Tandem gait was not attempted.  Romberg is negative.  Reflexes: Deep tendon reflexes are symmetric.   Assessment/Plan:  1.  Parkinson's disease  2.  Mild memory disturbance  3.  Hallucinations  The patient will be taken off of the Symmetrel.  They are to contact me in 2 weeks if the hallucinations have not improved, we will consider cutting back some on the Mirapex dosing.  The patient will follow-up for her next scheduled appointment in June.  Jill Alexanders MD 04/21/2018 3:58 PM  [facility] [address] [place], [place] [address]  Phone [phone] Fax [phone]

## 2018-04-21 NOTE — Patient Instructions (Signed)
Stop the symmetrel (amantidine). Call me in 2 weeks if the hallucinations are continuing.

## 2018-04-22 DIAGNOSIS — E063 Autoimmune thyroiditis: Secondary | ICD-10-CM | POA: Diagnosis not present

## 2018-04-22 DIAGNOSIS — E039 Hypothyroidism, unspecified: Secondary | ICD-10-CM | POA: Diagnosis not present

## 2018-05-04 ENCOUNTER — Telehealth: Payer: Self-pay | Admitting: Neurology

## 2018-05-04 MED ORDER — PRAMIPEXOLE DIHYDROCHLORIDE 1 MG PO TABS: 1.0000 mg | ORAL_TABLET | Freq: Three times a day (TID) | ORAL | 2 refills | Status: DC

## 2018-05-04 NOTE — Telephone Encounter (Signed)
The patient continues to have severe hallucinations that are visual in nature, she is now off of the amantadine, we will reduce the Mirapex to 1 mg 3 times daily, if this does not help in 2 weeks, we will add a medication for the hallucinations.

## 2018-05-04 NOTE — Telephone Encounter (Signed)
Patient would like call back from nurse or doctor regarding hallucinations. Best call back is [phone]

## 2018-05-04 NOTE — Telephone Encounter (Signed)
I contacted the pt. She states she is still having increased hallucinations. She states the hallucinations are only visual denies auditory. She describes her hallucinations as seeing people and animals.  Pt states she has not d/c the amantadine but is currently tapering off. Patient states she is down to 1 tablet daily and has been taking this dosage for 1 week. Patient was advised to hold the amantadine dosage until MD further reviews. Patient was agreeable and had no further questions at this time.

## 2018-05-18 ENCOUNTER — Telehealth: Payer: Self-pay

## 2018-05-18 MED ORDER — QUETIAPINE FUMARATE 25 MG PO TABS: 25.0000 mg | ORAL_TABLET | Freq: Every day | ORAL | 3 refills | Status: DC

## 2018-05-18 NOTE — Telephone Encounter (Signed)
Pt said the hallucinations have gotten only a little better. She is wanting to be seen sooner than. Please call to advise, pt is requesting to speak with Dr Jannifer Franklin

## 2018-05-18 NOTE — Telephone Encounter (Signed)
I contacted the pt and advised we had an earlier appt become available on 05/19/18 at 12 pm.( pt on wait list) Advised pt to check in at 11:30 am. Pt was agreeable.

## 2018-05-18 NOTE — Telephone Encounter (Signed)
I called the patient, she is still having quite a number of visual hallucinations, I will start a low-dose of the Seroquel taking 25 mg in the evening, she will call for any dose adjustments.

## 2018-05-18 NOTE — Addendum Note (Signed)
Addended by: Kathrynn Ducking on: 05/18/2018 09:25 AM   Modules accepted: Orders

## 2018-05-19 ENCOUNTER — Encounter: Payer: Self-pay | Admitting: Neurology

## 2018-05-19 ENCOUNTER — Ambulatory Visit (INDEPENDENT_AMBULATORY_CARE_PROVIDER_SITE_OTHER): Payer: Medicare Other | Admitting: Neurology

## 2018-05-19 VITALS — BP 96/60 | HR 84 | Ht 62.0 in | Wt 140.0 lb

## 2018-05-19 DIAGNOSIS — G20 Parkinson's disease: Secondary | ICD-10-CM | POA: Diagnosis not present

## 2018-05-19 NOTE — Patient Instructions (Signed)
Stop the Azilect. Call in 3 weeks if the hallucinations are continuing to be a problem.

## 2018-05-19 NOTE — Progress Notes (Signed)
Reason for visit: Parkinson's disease  Amy [name] is an 73 y.o. female  History of present illness:  Amy [name] is a 73 year old right-handed white female with a history of Parkinson's disease.  The patient has had some recent issues with significant visual hallucinations that are bothersome to her throughout the day.  The patient will see people inside of her house, she is afraid to leave the house as she is concerned that they may steal from her.  The patient has seen surgical wounds with blood coming out of on her husband, she has seen her husband being unfaithful to her with some of the hallucinations.  At times the patient has wanted to call the police.  She is not operating a motor vehicle currently.  The patient returns for an evaluation.  She was just started on Seroquel last evening, she slept quite well with about 45 minutes of a hangover type feeling after she woke up.  She has cut back on the Mirapex to 1 mg 3 times daily, she is off her amantadine.  Past Medical History:  Diagnosis Date  . Dyspnea on exertion   . Hashimoto's thyroiditis   . Hypothyroidism   . Impaired fasting blood sugar   . Palpitations   . Parkinson's disease ([place])   . Parkinsonism ([place])   . Ventricular premature beats     Past Surgical History:  Procedure Laterality Date  . CATARACT EXTRACTION Bilateral   . CESAREAN SECTION     x2  . CHOLECYSTECTOMY    . FOOT SURGERY Right 01/15/2016    Family History  Problem Relation Age of Onset  . Breast cancer Mother   . Lung disease Brother     Social history:  reports that she has never smoked. She has never used smokeless tobacco. She reports that she does not drink alcohol or use drugs.    Allergies  Allergen Reactions  . Ciprofloxacin Rash    Medications:  Prior to Admission medications   Medication Sig Start Date End Date Taking? Authorizing Provider  acetaminophen (TYLENOL) 325 MG tablet Take 650-975 mg by mouth every 6 (six) hours as  needed for moderate pain.   Yes [provider]  acetaminophen (TYLENOL) 500 MG tablet Take 2 tablets (1,000 mg total) by mouth every 6 (six) hours as needed. 02/26/17  Yes Charlesetta Shanks, MD  alendronate (FOSAMAX) 70 MG tablet TAKE 1 TABLET BY MOUTH ONE TIME PER WEEK 01/05/18  Yes [provider]  carbidopa-levodopa (SINEMET IR) 25-100 MG tablet TAKE 1 TABLET FIVE TIMES A DAY 04/06/18  Yes Kathrynn Ducking, MD  Carbidopa-Levodopa ER (SINEMET CR) 25-100 MG tablet controlled release Take 1 tablet by mouth at bedtime. 06/17/17  Yes Kathrynn Ducking, MD  Carboxymethylcellul-Glycerin (CLEAR EYES FOR DRY EYES) 1-0.25 % SOLN Place 1 drop into both eyes daily as needed (dry eyes).   Yes [provider]  entacapone (COMTAN) 200 MG tablet 1 tablet 5 times daily with Sinemet 04/20/18  Yes Kathrynn Ducking, MD  levothyroxine (SYNTHROID, LEVOTHROID) 50 MCG tablet Take 50 mcg daily before breakfast by mouth.   Yes [provider]  pramipexole (MIRAPEX) 1 MG tablet Take 1 tablet (1 mg total) by mouth 3 (three) times daily. 05/04/18  Yes Kathrynn Ducking, MD  QUEtiapine (SEROQUEL) 25 MG tablet Take 1 tablet (25 mg total) by mouth at bedtime. 05/18/18  Yes Kathrynn Ducking, MD  rasagiline (AZILECT) 0.5 MG TABS tablet Take 1 tablet (0.5 mg total)  by mouth daily. 01/28/18  Yes Kathrynn Ducking, MD  rivastigmine (EXELON) 1.5 MG capsule Take 1 capsule (1.5 mg total) by mouth 2 (two) times daily. Patient not taking: Reported on 05/19/2018 02/26/18   Kathrynn Ducking, MD    ROS:  Out of a complete 14 system review of symptoms, the patient complains only of the following symptoms, and all other reviewed systems are negative.  Eye itching, eye redness Shortness of breath Palpitations of the heart Restless legs Back pain Memory loss, tremors Confusion, hallucinations  Blood pressure 96/60, pulse 84, height 5\' 2"  (1.575 m), weight 140 lb (63.5 kg).  Physical Exam  General:  The patient is alert and cooperative at the time of the examination.  Mild dyskinesias are seen with the head and neck area.  Skin: No significant peripheral edema is noted.   Neurologic Exam  Mental status: The patient is alert and oriented x 3 at the time of the examination. The patient has apparent normal recent and remote memory, with an apparently normal attention span and concentration ability.   Cranial nerves: Facial symmetry is present. Speech is normal, no aphasia or dysarthria is noted. Extraocular movements are full. Visual fields are full.  Motor: The patient has good strength in all 4 extremities.  Sensory examination: Soft touch sensation is symmetric on the face, arms, and legs.  Coordination: The patient has good finger-nose-finger and heel-to-shin bilaterally.  Gait and station: The patient has a normal gait. Tandem gait is slightly unsteady. Romberg is negative. No drift is seen.  Reflexes: Deep tendon reflexes are symmetric.   Assessment/Plan:  1.  Parkinson's disease  2.  Visual hallucinations  The patient will stop the Azilect at this time.  Within the next 3 weeks if she is continuing to have hallucinations she will contact our office and we will reduce the Mirapex to 0.75 mg 3 times daily.  The patient will be referred for physical therapy for her gait.  She will follow-up here in June 2020.  Jill Alexanders MD 05/19/2018 12:27 PM  [facility] [address] [place], [place] [address]  Phone [phone] Fax [phone]

## 2018-06-04 ENCOUNTER — Other Ambulatory Visit: Payer: Self-pay

## 2018-06-04 ENCOUNTER — Emergency Department (HOSPITAL_COMMUNITY)
Admission: EM | Admit: 2018-06-04 | Discharge: 2018-06-04 | Disposition: A | Discharge status: Home / Self Care | Payer: Medicare Other | Attending: Emergency Medicine | Admitting: Emergency Medicine

## 2018-06-04 ENCOUNTER — Encounter (HOSPITAL_COMMUNITY): Payer: Self-pay

## 2018-06-04 DIAGNOSIS — R1011 Right upper quadrant pain: Secondary | ICD-10-CM | POA: Diagnosis not present

## 2018-06-04 DIAGNOSIS — R1084 Generalized abdominal pain: Secondary | ICD-10-CM | POA: Diagnosis not present

## 2018-06-04 DIAGNOSIS — E039 Hypothyroidism, unspecified: Secondary | ICD-10-CM | POA: Diagnosis not present

## 2018-06-04 DIAGNOSIS — Z79899 Other long term (current) drug therapy: Secondary | ICD-10-CM | POA: Diagnosis not present

## 2018-06-04 DIAGNOSIS — R1030 Lower abdominal pain, unspecified: Secondary | ICD-10-CM | POA: Diagnosis not present

## 2018-06-04 DIAGNOSIS — R52 Pain, unspecified: Secondary | ICD-10-CM | POA: Diagnosis not present

## 2018-06-04 LAB — COMPREHENSIVE METABOLIC PANEL
ALT: 8 U/L (ref 0–44)
AST: 42 U/L — ABNORMAL HIGH (ref 15–41)
Albumin: 3.8 g/dL (ref 3.5–5.0)
Alkaline Phosphatase: 67 U/L (ref 38–126)
Anion gap: 9 (ref 5–15)
BILIRUBIN TOTAL: 1.1 mg/dL (ref 0.3–1.2)
BUN: 15 mg/dL (ref 8–23)
CO2: 23 mmol/L (ref 22–32)
CREATININE: 0.76 mg/dL (ref 0.44–1.00)
Calcium: 8.6 mg/dL — ABNORMAL LOW (ref 8.9–10.3)
Chloride: 105 mmol/L (ref 98–111)
GFR calc Af Amer: >60 mL/min (ref >60)
Glucose, Bld: 98 mg/dL (ref 70–99)
Potassium: 5 mmol/L (ref 3.5–5.1)
Sodium: 137 mmol/L (ref 135–145)
TOTAL PROTEIN: 6.3 g/dL — AB (ref 6.5–8.1)

## 2018-06-04 LAB — URINALYSIS, ROUTINE W REFLEX MICROSCOPIC
Bilirubin Urine: NEGATIVE
Glucose, UA: NEGATIVE mg/dL
KETONES UR: NEGATIVE mg/dL
Leukocytes,Ua: NEGATIVE
Nitrite: NEGATIVE
PROTEIN: NEGATIVE mg/dL
Specific Gravity, Urine: 1.013 (ref 1.005–1.030)
pH: 5 (ref 5.0–8.0)

## 2018-06-04 LAB — CBC WITH DIFFERENTIAL/PLATELET
ABS IMMATURE GRANULOCYTES: 0.03 10*3/uL (ref 0.00–0.07)
BASOS PCT: 0 %
Basophils Absolute: 0 10*3/uL (ref 0.0–0.1)
EOS ABS: 0.1 10*3/uL (ref 0.0–0.5)
Eosinophils Relative: 1 %
HEMATOCRIT: 46 % (ref 36.0–46.0)
Hemoglobin: 14.8 g/dL (ref 12.0–15.0)
Immature Granulocytes: 0 %
Lymphocytes Relative: 17 %
Lymphs Abs: 1.7 10*3/uL (ref 0.7–4.0)
MCH: 28.6 pg (ref 26.0–34.0)
MCHC: 32.2 g/dL (ref 30.0–36.0)
MCV: 88.8 fL (ref 80.0–100.0)
MONO ABS: 0.5 10*3/uL (ref 0.1–1.0)
MONOS PCT: 5 %
NEUTROS ABS: 7.4 10*3/uL (ref 1.7–7.7)
Neutrophils Relative %: 77 %
Platelets: 251 10*3/uL (ref 150–400)
RBC: 5.18 MIL/uL — ABNORMAL HIGH (ref 3.87–5.11)
RDW: 13.2 % (ref 11.5–15.5)
WBC: 9.7 10*3/uL (ref 4.0–10.5)
nRBC: 0 % (ref 0.0–0.2)

## 2018-06-04 LAB — LIPASE, BLOOD: LIPASE: 29 U/L (ref 11–51)

## 2018-06-04 MED ORDER — SODIUM CHLORIDE 0.9 % IV BOLUS
500.0000 mL | Freq: Once | INTRAVENOUS | Status: AC
Start: 2018-06-04 — End: 2018-06-04
  Administered 2018-06-04: 500 mL via INTRAVENOUS

## 2018-06-04 NOTE — Discharge Instructions (Addendum)
The testing today is reassuring.  There is no sign of infections at this time.  Take your usual medications.  You can use Tylenol as needed for pain.  Make sure your bowels stay moving and use a stool softener such as Colace if needed.

## 2018-06-04 NOTE — ED Provider Notes (Signed)
[place] EMERGENCY DEPARTMENT Provider Note   CSN: [id number] Arrival date & time: 06/04/18  1707    History   Chief Complaint Chief Complaint  Patient presents with  . Abdominal Pain    HPI Amy [name] is a 73 y.o. female.     HPI   She presents for evaluation of abdominal pain.  She came by EMS.  She was treated with fentanyl, during transport.  Patient has severe Parkinson's disease, complicated by hallucinations, and paranoia.  She sees her neurologist, regularly.  The patient's abdominal pain is right upper quadrant.  Patient noticed the pain this morning prior to having a bowel movement.  Since then she has had several loose bowel movements.  She did not take anything for constipation.  She denies dysuria, urinary frequency, hematuria, urgency to void.  She has not had any fever, sore throat, sneezing, ear pain, cough or shortness of breath.  She denies known sick contacts.  There are no other known modifying factors.    Past Medical History:  Diagnosis Date  . Dyspnea on exertion   . Hashimoto's thyroiditis   . Hypothyroidism   . Impaired fasting blood sugar   . Palpitations   . Parkinson's disease ([place])   . Parkinsonism ([place])   . Ventricular premature beats     Patient Active Problem List   Diagnosis Date Noted  . Memory loss 01/29/2018  . Parkinson's disease ([place]) 01/27/2017    Past Surgical History:  Procedure Laterality Date  . CATARACT EXTRACTION Bilateral   . CESAREAN SECTION     x2  . CHOLECYSTECTOMY    . FOOT SURGERY Right 01/15/2016     OB History   No obstetric history on file.      Home Medications    Prior to Admission medications   Medication Sig Start Date End Date Taking? Authorizing Provider  acetaminophen (TYLENOL) 325 MG tablet Take 650-975 mg by mouth every 6 (six) hours as needed for moderate pain.    [provider]  acetaminophen (TYLENOL) 500 MG tablet Take 2 tablets (1,000 mg total) by mouth  every 6 (six) hours as needed. 02/26/17   Charlesetta Shanks, MD  alendronate (FOSAMAX) 70 MG tablet TAKE 1 TABLET BY MOUTH ONE TIME PER WEEK 01/05/18   [provider]  carbidopa-levodopa (SINEMET IR) 25-100 MG tablet TAKE 1 TABLET FIVE TIMES A DAY 04/06/18   Kathrynn Ducking, MD  Carbidopa-Levodopa ER (SINEMET CR) 25-100 MG tablet controlled release Take 1 tablet by mouth at bedtime. 06/17/17   Kathrynn Ducking, MD  Carboxymethylcellul-Glycerin (CLEAR EYES FOR DRY EYES) 1-0.25 % SOLN Place 1 drop into both eyes daily as needed (dry eyes).    [provider]  entacapone (COMTAN) 200 MG tablet 1 tablet 5 times daily with Sinemet 04/20/18   Kathrynn Ducking, MD  levothyroxine (SYNTHROID, LEVOTHROID) 50 MCG tablet Take 50 mcg daily before breakfast by mouth.    [provider]  pramipexole (MIRAPEX) 1 MG tablet Take 1 tablet (1 mg total) by mouth 3 (three) times daily. 05/04/18   Kathrynn Ducking, MD  QUEtiapine (SEROQUEL) 25 MG tablet Take 1 tablet (25 mg total) by mouth at bedtime. 05/18/18   Kathrynn Ducking, MD  rivastigmine (EXELON) 1.5 MG capsule Take 1 capsule (1.5 mg total) by mouth 2 (two) times daily. Patient not taking: Reported on 05/19/2018 02/26/18   Kathrynn Ducking, MD    Family History Family History  Problem Relation Age of  Onset  . Breast cancer Mother   . Lung disease Brother     Social History Social History   Tobacco Use  . Smoking status: Never Smoker  . Smokeless tobacco: Never Used  Substance Use Topics  . Alcohol use: No    Frequency: Never  . Drug use: No     Allergies   Ciprofloxacin   Review of Systems Review of Systems  All other systems reviewed and are negative.    Physical Exam Updated Vital Signs BP 128/75 (BP Location: Right Arm)   Pulse 88   Temp 97.9 F (36.6 C) (Oral)   Resp 14   Ht 5\' 2"  (1.575 m)   Wt 61.7 kg   SpO2 97%   BMI 24.87 kg/m   Physical Exam Vitals signs and nursing note reviewed.   Constitutional:      General: She is not in acute distress.    Appearance: She is well-developed and normal weight. She is not ill-appearing, toxic-appearing or diaphoretic.  HENT:     Head: Normocephalic and atraumatic.  Eyes:     Conjunctiva/sclera: Conjunctivae normal.     Pupils: Pupils are equal, round, and reactive to light.  Neck:     Musculoskeletal: Normal range of motion and neck supple.     Trachea: Phonation normal.  Cardiovascular:     Rate and Rhythm: Normal rate and regular rhythm.  Pulmonary:     Effort: Pulmonary effort is normal.     Breath sounds: Normal breath sounds.  Chest:     Chest wall: No tenderness.  Abdominal:     General: There is no distension.     Palpations: Abdomen is soft.     Tenderness: There is abdominal tenderness (Suprapubic, mild). There is no right CVA tenderness, left CVA tenderness, guarding or rebound.  Musculoskeletal: Normal range of motion.  Skin:    General: Skin is warm and dry.  Neurological:     Mental Status: She is alert and oriented to person, place, and time.     Motor: No abnormal muscle tone.  Psychiatric:        Mood and Affect: Mood normal.        Behavior: Behavior normal.        Thought Content: Thought content normal.      ED Treatments / Results  Labs (all labs ordered are listed, but only abnormal results are displayed) Labs Reviewed - No data to display  EKG None  Radiology No results found.  Procedures Procedures (including critical care time)  Medications Ordered in ED Medications - No data to display   Initial Impression / Assessment and Plan / ED Course  I have reviewed the triage vital signs and the nursing notes.  Pertinent labs & imaging results that were available during my care of the patient were reviewed by me and considered in my medical decision making (see chart for details).         Patient Vitals for the past 24 hrs:  BP Temp Temp src Pulse Resp SpO2 Height Weight   06/04/18 1700 - - - - - - 5\' 2"  (1.575 m) 61.7 kg  06/04/18 1657 128/75 97.9 F (36.6 C) Oral 88 14 97 % - -    6:59 PM Reevaluation with update and discussion. After initial assessment and treatment, an updated evaluation reveals she has had another bowel movement in the ED and states that she has no pain at this time.  Findings discussed and questions answered. Vira Agar  Eulis Foster   Medical Decision Making: Diella Gillingham was evaluated in Emergency Department on 06/04/2018 for the symptoms described in the history of present illness. She was evaluated in the context of the global COVID-19 pandemic, which necessitated consideration that the patient might be at risk for infection with the SARS-CoV-2 virus that causes COVID-19. Institutional protocols and algorithms that pertain to the evaluation of patients at risk for COVID-19 are in a state of rapid change based on information released by regulatory bodies including the CDC and federal and state organizations. These policies and algorithms were followed during the patient's care in the ED.  Nonspecific abdominal pain with reassuring evaluation.  Doubt serious bacterial infection, urinary tract infection, intra-abdominal infection, metabolic instability or impending vascular collapse.   CRITICAL CARE-no Performed by: Daleen Bo  Nursing Notes Reviewed/ Care Coordinated Applicable Imaging Reviewed Interpretation of Laboratory Data incorporated into ED treatment  The patient appears reasonably screened and/or stabilized for discharge and I doubt any other medical condition or other [facility] requiring further screening, evaluation, or treatment in the ED at this time prior to discharge.  Plan: Home Medications-continue usual, Tylenol if needed for pain; Home Treatments-rest, fluids; return here if the recommended treatment, does not improve the symptoms; Recommended follow up-PCP, PRN   Final Clinical Impressions(s) / ED Diagnoses   Final diagnoses:   None    ED Discharge Orders    None       Daleen Bo, MD 06/04/18 1900

## 2018-06-04 NOTE — ED Triage Notes (Signed)
Pt reports having abdominal pain for 10 hours, [facility] delivered 125mcg fentanyl. 3x episodes of diarrhea.

## 2018-06-04 NOTE — ED Notes (Addendum)
[phone] cell phone Amy [name], husband, please call with questions or updates [phone] home phone

## 2018-06-06 ENCOUNTER — Other Ambulatory Visit: Payer: Self-pay | Admitting: Neurology

## 2018-06-09 ENCOUNTER — Other Ambulatory Visit: Payer: Self-pay | Admitting: Neurology

## 2018-06-18 ENCOUNTER — Telehealth: Payer: Self-pay | Admitting: Neurology

## 2018-06-18 MED ORDER — ENTACAPONE 200 MG PO TABS: ORAL_TABLET | ORAL | 3 refills | Status: DC

## 2018-06-18 MED ORDER — ENTACAPONE 200 MG PO TABS: ORAL_TABLET | ORAL | 0 refills | Status: DC

## 2018-06-18 NOTE — Telephone Encounter (Signed)
Patient returned my phone call and was able to confirm her dosage. Pt states she has been taking 0.5 tablets of the Comtan 200 mg daily. Pt's signature of this medication has changed and she is now taking 1 whole tablet 5 times per day. I advised I would send a 30 day supply of this medication to [facility] and [facility] since she will be out of her med on Sunday 06/21/18.  Patient verbalized appreciation

## 2018-06-18 NOTE — Telephone Encounter (Signed)
Requested a call back from the pt to discuss. Pt was advised our office would be closing at 5 pm and closed on 4/10. Pt was advised to call back before 5 pm today or call back on 06/22/2018.

## 2018-06-18 NOTE — Telephone Encounter (Signed)
Pt called and states that when she picked up herentacapone (COMTAN) 200 MG tablet the pharmacy only gave her half of the amount she was to get. Now she is about to run out and is needing a refill but the refill is not due yet. She would like to know what can be done. Please advise.

## 2018-06-18 NOTE — Telephone Encounter (Signed)
[facility] and was connected with Lannette Donath. Lannette Donath was able to confirm the pt received a supply in feb in 2020 with quantity 225. I ask what the signature of the medication was and was advised I would have to speak with the pharmacist.. After holding for 20 + mins for the pharmacist I disconnected the call.  I contacted the pt directly and requested her to call me back. I am trying to verify the instructions on how she has been taking her medication.

## 2018-07-06 ENCOUNTER — Telehealth: Payer: Self-pay | Admitting: Neurology

## 2018-07-06 MED ORDER — PREDNISONE 5 MG PO TABS
ORAL_TABLET | ORAL | 0 refills | Status: DC
Start: 2018-07-06 — End: 2018-08-26

## 2018-07-06 MED ORDER — QUETIAPINE FUMARATE 25 MG PO TABS: 50.0000 mg | ORAL_TABLET | Freq: Every day | ORAL | 1 refills | Status: DC

## 2018-07-06 NOTE — Addendum Note (Signed)
Addended by: Kathrynn Ducking on: 07/06/2018 05:04 PM   Modules accepted: Orders

## 2018-07-06 NOTE — Telephone Encounter (Signed)
I called the patient.  The patient within the last month has developed some leg discomfort from the knees down, no pain above the knees.  The patient does not report muscle cramps or numbness in the legs.  The pain is mainly in the muscle, not the joints.  She finds that walking worsens the pain, she sometimes however will wake up with the leg pain even before she gets out of bed.  The pain is limiting her ability to walk.  She is also having ongoing problems with hallucinations, initially the 25 mg dose of Seroquel was helpful, we will go up to the 50 mg dose.  She is also having increased problems with anxiety, she is having panic attacks.  I will try her on a prednisone taper, if this does not improve her leg discomfort, we may need to do EMG nerve conduction study evaluation in the future.

## 2018-07-06 NOTE — Telephone Encounter (Signed)
Pt is asking for a call back to discuss sever leg pain(both legs) for about a month.  Pt states she also has anxiety attacks. She also has difficulty walking, the leg pain affects her feet.

## 2018-07-08 ENCOUNTER — Ambulatory Visit: Payer: Medicare Other | Admitting: Neurology

## 2018-07-11 ENCOUNTER — Other Ambulatory Visit: Payer: Self-pay | Admitting: Neurology

## 2018-07-14 DIAGNOSIS — R21 Rash and other nonspecific skin eruption: Secondary | ICD-10-CM | POA: Diagnosis not present

## 2018-07-17 DIAGNOSIS — B029 Zoster without complications: Secondary | ICD-10-CM | POA: Diagnosis not present

## 2018-07-20 DIAGNOSIS — B029 Zoster without complications: Secondary | ICD-10-CM | POA: Diagnosis not present

## 2018-07-27 ENCOUNTER — Other Ambulatory Visit: Payer: Self-pay | Admitting: Neurology

## 2018-07-29 ENCOUNTER — Emergency Department (HOSPITAL_COMMUNITY): Payer: Medicare Other

## 2018-07-29 ENCOUNTER — Emergency Department (HOSPITAL_COMMUNITY)
Admission: EM | Admit: 2018-07-29 | Discharge: 2018-07-29 | Disposition: A | Discharge status: Home / Self Care | Payer: Medicare Other | Attending: Emergency Medicine | Admitting: Emergency Medicine

## 2018-07-29 ENCOUNTER — Encounter (HOSPITAL_COMMUNITY): Payer: Self-pay | Admitting: Emergency Medicine

## 2018-07-29 ENCOUNTER — Other Ambulatory Visit: Payer: Self-pay

## 2018-07-29 DIAGNOSIS — R0602 Shortness of breath: Secondary | ICD-10-CM | POA: Diagnosis not present

## 2018-07-29 DIAGNOSIS — Z79899 Other long term (current) drug therapy: Secondary | ICD-10-CM | POA: Diagnosis not present

## 2018-07-29 DIAGNOSIS — G20 Parkinson's disease: Secondary | ICD-10-CM | POA: Insufficient documentation

## 2018-07-29 DIAGNOSIS — R29810 Facial weakness: Secondary | ICD-10-CM | POA: Diagnosis not present

## 2018-07-29 DIAGNOSIS — R079 Chest pain, unspecified: Secondary | ICD-10-CM

## 2018-07-29 DIAGNOSIS — R0789 Other chest pain: Secondary | ICD-10-CM | POA: Diagnosis not present

## 2018-07-29 DIAGNOSIS — R21 Rash and other nonspecific skin eruption: Secondary | ICD-10-CM | POA: Insufficient documentation

## 2018-07-29 DIAGNOSIS — E039 Hypothyroidism, unspecified: Secondary | ICD-10-CM | POA: Insufficient documentation

## 2018-07-29 LAB — COMPREHENSIVE METABOLIC PANEL
ALT: 10 U/L (ref 0–44)
AST: 27 U/L (ref 15–41)
Albumin: 4 g/dL (ref 3.5–5.0)
Alkaline Phosphatase: 71 U/L (ref 38–126)
Anion gap: 15 (ref 5–15)
BUN: 14 mg/dL (ref 8–23)
CO2: 21 mmol/L — ABNORMAL LOW (ref 22–32)
Calcium: 9.5 mg/dL (ref 8.9–10.3)
Chloride: 102 mmol/L (ref 98–111)
Creatinine, Ser: 0.85 mg/dL (ref 0.44–1.00)
GFR calc Af Amer: >60 mL/min (ref >60)
GFR calc non Af Amer: >60 mL/min (ref >60)
Glucose, Bld: 92 mg/dL (ref 70–99)
Potassium: 4.2 mmol/L (ref 3.5–5.1)
Sodium: 138 mmol/L (ref 135–145)
Total Bilirubin: 0.8 mg/dL (ref 0.3–1.2)
Total Protein: 7 g/dL (ref 6.5–8.1)

## 2018-07-29 LAB — CBC WITH DIFFERENTIAL/PLATELET
Abs Immature Granulocytes: 0.01 10*3/uL (ref 0.00–0.07)
Basophils Absolute: 0 10*3/uL (ref 0.0–0.1)
Basophils Relative: 0 %
Eosinophils Absolute: 0.1 10*3/uL (ref 0.0–0.5)
Eosinophils Relative: 1 %
HCT: 43.5 % (ref 36.0–46.0)
Hemoglobin: 14.5 g/dL (ref 12.0–15.0)
Immature Granulocytes: 0 %
Lymphocytes Relative: 22 %
Lymphs Abs: 2.2 10*3/uL (ref 0.7–4.0)
MCH: 29.4 pg (ref 26.0–34.0)
MCHC: 33.3 g/dL (ref 30.0–36.0)
MCV: 88.2 fL (ref 80.0–100.0)
Monocytes Absolute: 0.7 10*3/uL (ref 0.1–1.0)
Monocytes Relative: 7 %
Neutro Abs: 6.9 10*3/uL (ref 1.7–7.7)
Neutrophils Relative %: 70 %
Platelets: 317 10*3/uL (ref 150–400)
RBC: 4.93 MIL/uL (ref 3.87–5.11)
RDW: 14 % (ref 11.5–15.5)
WBC: 10 10*3/uL (ref 4.0–10.5)
nRBC: 0 % (ref 0.0–0.2)

## 2018-07-29 LAB — TROPONIN I
Troponin I: <0.03 ng/mL (ref <0.03)
Troponin I: <0.03 ng/mL (ref <0.03)

## 2018-07-29 MED ORDER — TRAMADOL HCL 50 MG PO TABS
50.0000 mg | ORAL_TABLET | Freq: Once | ORAL | Status: AC
  Administered 2018-07-29: 50 mg via ORAL
  Filled 2018-07-29: qty 1

## 2018-07-29 NOTE — Discharge Instructions (Signed)
It was my pleasure taking care of you today!   Please call the cardiology clinic listed to schedule a follow up appointment to further discuss your chest pain.  Follow up with your primary care doctor in regards to your shingles.   Return to ER for new or worsening symptoms, any additional concerns.

## 2018-07-29 NOTE — ED Notes (Signed)
Patient verbalizes understanding of discharge instructions. Opportunity for questioning and answers were provided. Armband removed by staff, pt discharged from ED.  

## 2018-07-29 NOTE — ED Notes (Signed)
Got patient undress on the monitor did ekg shown to Dr Alvino Chapel patient is resting with call bell and phone in reach

## 2018-07-29 NOTE — ED Notes (Signed)
Pt requesting to leave. This RN had delegated blood draw and unaware it had not been drawn. EDP made aware and will see patient at bedside.

## 2018-07-29 NOTE — ED Notes (Signed)
Pt refused rectal temp EDP aware. Pt ambulatory to bathroom.

## 2018-07-29 NOTE — ED Provider Notes (Signed)
  Physical Exam  BP 110/80   Pulse 93   Temp 97.9 F (36.6 C) (Oral)   Resp (!) 23   SpO2 99%   Physical Exam  ED Course/Procedures     Procedures  MDM  Amy [name] is a 73 y.o. female who presents with atypical chest pain for over a year. No symptoms while in the ED.    Of note patient has shingles to her right face.  Patient is currently undergoing treatment and and management per PCP.  Pending delta Troponin. If negative can discharged home.   Second troponin negative, nondetectable.  Discussed results with patient.  Patient at this time would like to go home.  Discussed appropriate follow-up with patient who states understanding of plan and agrees.  No further questions or concerns at this time.  Patient discharged home in stable condition.       [name], MD 07/29/18 [place], MD 07/29/18 [phone]

## 2018-07-29 NOTE — ED Notes (Signed)
Pt taken to car for pick up with husband.

## 2018-07-29 NOTE — ED Provider Notes (Signed)
[facility] EMERGENCY DEPARTMENT Provider Note   CSN: [id number] Arrival date & time: 07/29/18  1121    History   Chief Complaint No chief complaint on file.   HPI Amy [name] is a 73 y.o. female.  HPI: A 73 year old patient presents for evaluation of chest pain. Initial onset of pain was more than 6 hours ago. The patient's chest pain is described as heaviness/pressure/tightness and is not worse with exertion. The patient's chest pain is middle- or left-sided, is not well-localized, is not sharp and does not radiate to the arms/jaw/neck. The patient does not complain of nausea and denies diaphoresis. The patient has no history of stroke, has no history of peripheral artery disease, has not smoked in the past 90 days, denies any history of treated diabetes, has no relevant family history of coronary artery disease (first degree relative at less than age 40), is not hypertensive, has no history of hypercholesterolemia and does not have an elevated BMI (>=30).   The history is provided by the patient and medical records.   Amy [name] is a 73 y.o. female  with a PMH of Parkinson's, hypothyroidism who presents to the Emergency Department complaining of chest pain.  She describes this as central, nonradiating pressure.  She states this started about 2 days ago.  It is not constant.  It will come " erratically" sometimes lasting 5 minutes, sometimes closer to an hour.  Denies any alleviating or aggravating factors.  She notes that sometimes it will occur with exertion, sometimes will start while she is sitting at rest, sometimes after eating, nothing that she is aware of that will always trigger these episodes.  She does endorse shortness of breath when her chest pain is occurring.  She states that she has had chest pain similar to this for almost 2 years, but never had the associated difficulty breathing.  The associated shortness of breath is which prompted her to come to the  emergency department today.  She was given 324 of aspirin in route by EMS.  EMS reports temperature of 101 oral.  Oral temp upon arrival in triage was 97.8.  She denies any fever or chills that she is aware of.  No cough, congestion.  No abdominal pain, nausea or vomiting.  No sudden sweating.  No back pain or urinary symptoms.  She states that she has shingles to the right side of her face.  She has been seeing her regular doctor for this and on medication.  She does have involvement to the ear which her doctor is aware of.  Past Medical History:  Diagnosis Date  . Dyspnea on exertion   . Hashimoto's thyroiditis   . Hypothyroidism   . Impaired fasting blood sugar   . Palpitations   . Parkinson's disease ([place])   . Parkinsonism ([place])   . Ventricular premature beats     Patient Active Problem List   Diagnosis Date Noted  . Memory loss 01/29/2018  . Parkinson's disease ([place]) 01/27/2017    Past Surgical History:  Procedure Laterality Date  . CATARACT EXTRACTION Bilateral   . CESAREAN SECTION     x2  . CHOLECYSTECTOMY    . FOOT SURGERY Right 01/15/2016     OB History   No obstetric history on file.      Home Medications    Prior to Admission medications   Medication Sig Start Date End Date Taking? Authorizing Provider  acetaminophen (TYLENOL) 500 MG tablet Take 2 tablets (1,000 mg total)  by mouth every 6 (six) hours as needed. Patient taking differently: Take 1,000 mg by mouth every 6 (six) hours as needed for mild pain or headache.  02/26/17  Yes Pfeiffer, Jeannie Done, MD  alendronate (FOSAMAX) 70 MG tablet Take 70 mg by mouth once a week. Sunday 01/05/18  Yes [provider]  carbidopa-levodopa (SINEMET IR) 25-100 MG tablet TAKE 1 TABLET FIVE TIMES A DAY Patient taking differently: Take 1 tablet by mouth 5 (five) times daily.  04/06/18  Yes Kathrynn Ducking, MD  Carbidopa-Levodopa ER (SINEMET CR) 25-100 MG tablet controlled release TAKE 1 TABLET AT BEDTIME 06/08/18  Yes  Kathrynn Ducking, MD  Carboxymethylcellul-Glycerin (CLEAR EYES FOR DRY EYES) 1-0.25 % SOLN Place 1 drop into both eyes daily as needed (dry eyes).   Yes [provider]  entacapone (COMTAN) 200 MG tablet TAKE 1 TABLET BY MOUTH 5 TIMES DAILY WITH SINEMET Patient taking differently: Take 200 mg by mouth 5 (five) times daily. TAKE 1 TABLET BY MOUTH 5 TIMES DAILY WITH SINEMET 07/13/18  Yes Kathrynn Ducking, MD  levothyroxine (SYNTHROID, LEVOTHROID) 50 MCG tablet Take 50 mcg daily before breakfast by mouth.   Yes [provider]  pramipexole (MIRAPEX) 1 MG tablet TAKE 1 TABLET BY MOUTH 3 TIMES A DAY Patient taking differently: Take 1 mg by mouth 3 (three) times daily.  07/27/18  Yes Kathrynn Ducking, MD  QUEtiapine (SEROQUEL) 25 MG tablet Take 2 tablets (50 mg total) by mouth at bedtime. 07/06/18  Yes Kathrynn Ducking, MD  traMADol (ULTRAM) 50 MG tablet Take 50 mg by mouth 4 (four) times daily as needed for pain. 07/17/18  Yes [provider]  predniSONE (DELTASONE) 5 MG tablet Begin taking 6 tablets daily, taper by one tablet every other day until off the medication. Patient not taking: Reported on 07/29/2018 07/06/18   Kathrynn Ducking, MD  rivastigmine (EXELON) 1.5 MG capsule Take 1 capsule (1.5 mg total) by mouth 2 (two) times daily. Patient not taking: Reported on 07/29/2018 02/26/18   Kathrynn Ducking, MD  valACYclovir (VALTREX) 1000 MG tablet Take 1,000 mg by mouth 3 (three) times daily. DS 7  Started on 07-17-18 07/17/18   [provider]    Family History Family History  Problem Relation Age of Onset  . Breast cancer Mother   . Lung disease Brother     Social History Social History   Tobacco Use  . Smoking status: Never Smoker  . Smokeless tobacco: Never Used  Substance Use Topics  . Alcohol use: No    Frequency: Never  . Drug use: No     Allergies   Ciprofloxacin   Review of Systems Review of Systems  Respiratory: Positive for shortness of  breath.   Cardiovascular: Positive for chest pain. Negative for palpitations and leg swelling.  Skin: Positive for rash (Presumed shingles rash to right side of the face).  All other systems reviewed and are negative.    Physical Exam Updated Vital Signs BP 133/76   Pulse 85   Temp 97.8 F (36.6 C) (Oral)   Resp (!) 30   SpO2 100%   Physical Exam Vitals signs and nursing note reviewed.  Constitutional:      General: She is not in acute distress.    Appearance: She is well-developed.  HENT:     Head: Normocephalic.  Neck:     Musculoskeletal: Neck supple.  Cardiovascular:     Rate and Rhythm: Normal rate and regular rhythm.  Heart sounds: Normal heart sounds. No murmur.  Pulmonary:     Effort: Pulmonary effort is normal. No respiratory distress.     Breath sounds: Normal breath sounds.  Abdominal:     General: There is no distension.     Palpations: Abdomen is soft.     Tenderness: There is no abdominal tenderness.  Musculoskeletal: Normal range of motion.        General: No tenderness.     Right lower leg: No edema.     Left lower leg: No edema.  Skin:    General: Skin is warm and dry.     Comments: Rash to the right face and neck with crusted vesicles to the outer ear.  Neurological:     Mental Status: She is alert and oriented to person, place, and time.     Comments: Right facial droop noted, otherwise CN 2-12 grossly intact.       ED Treatments / Results  Labs (all labs ordered are listed, but only abnormal results are displayed) Labs Reviewed  COMPREHENSIVE METABOLIC PANEL - Abnormal; Notable for the following components:      Result Value   CO2 21 (*)    All other components within normal limits  CBC WITH DIFFERENTIAL/PLATELET  TROPONIN I  TROPONIN I    EKG EKG Interpretation  Date/Time:  Wednesday Jul 29 2018 11:24:38 EDT Ventricular Rate:  89 PR Interval:    QRS Duration: 86 QT Interval:  368 QTC Calculation: 448 R Axis:   56 Text  Interpretation:  Sinus rhythm Probable left atrial enlargement Confirmed by Davonna Belling [phone]) on 07/29/2018 12:55:55 PM   Radiology Dg Chest Portable 1 View  Result Date: 07/29/2018 CLINICAL DATA:  Shortness of breath. EXAM: PORTABLE CHEST 1 VIEW COMPARISON:  Radiographs of February 25, 2017. FINDINGS: The heart size and mediastinal contours are within normal limits. Both lungs are clear. No pneumothorax or pleural effusion is noted. The visualized skeletal structures are unremarkable. IMPRESSION: No active disease. Electronically Signed   By: Marijo Conception M.D.   On: 07/29/2018 12:39    Procedures Procedures (including critical care time)  Medications Ordered in ED Medications  traMADol (ULTRAM) tablet 50 mg (50 mg Oral Given 07/29/18 1323)     Initial Impression / Assessment and Plan / ED Course  I have reviewed the triage vital signs and the nursing notes.  Pertinent labs & imaging results that were available during my care of the patient were reviewed by me and considered in my medical decision making (see chart for details).     HEAR Score: 3  Amy [name] is a 73 y.o. female who presents to ED for chest pain.  Patient reports that she has been having similar chest pain for the last year, however developed shortness of breath with her pain for the first time over the last 2 to 3 days.  On exam, patient is afebrile, hemodynamically stable with normal cardiopulmonary examination.  She does have evidence of right facial droop and shingles with your involvement on exam.  She states that she was diagnosed with shingles by her doctor and has been on medication for the last 2 weeks.  I asked if I speak with her son about her care today to ask about events leading to ER visit however she told me know she does not want her son involved in her care. Does have valtrex listed on med rec starting 5/08 confirming that this does not appear to be new issue  today. EKG with no acute ischemic  changes. CXR without acute findings. Denies infectious symptoms. Labs reassuring including normal troponin. Heart score low risk at 3. Appears this has been an ongoing issue for her for months, possibly even years. Doubt acute event. Will get 2nd troponin. If negative, likely discharge to home with cardiology follow up. Did discuss that should her trop return negative, reasons to return to ER as lab is pending at shift change. She voiced understanding with plan. Oncoming provider to follow up on this.   Patient discussed with Dr. Alvino Chapel who agrees with treatment plan.    Final Clinical Impressions(s) / ED Diagnoses   Final diagnoses:  None    ED Discharge Orders    None       Ward, Ozella Almond, PA-C 07/29/18 1542    Davonna Belling, MD 08/04/18 1501

## 2018-07-29 NOTE — ED Notes (Signed)
Snack and soda given. Pt less anxious

## 2018-07-29 NOTE — ED Triage Notes (Signed)
Per [facility]: Pt has active shingles case on right side of face and neck. Pt has right sided facial droop that started two days ago per pts son. Pt also complaining of chest pressure and shortness of breath intermittently for the last few weeks. Pt has hx of parkinsons and some short term memory issues. Family states that this is baseline for the pt. Pt given 324 ASA with EMS. Pt had fever of 101 with EMS.

## 2018-07-30 ENCOUNTER — Telehealth: Payer: Self-pay | Admitting: Cardiology

## 2018-07-30 NOTE — Telephone Encounter (Signed)
New Message    Pts husband is calling to schedule his wifes hopsital f/u. He says he is concenred because one side of her face is drooping. And she cant close her eye. He says he would like her to be seen soon    Please call

## 2018-07-30 NOTE — Telephone Encounter (Signed)
I spoke with pt and her husband. They report  facial droop and eye issues are the same as when seen in the ED yesterday.  They have not worsened.  Pt does have Parkinson's which makes her speech slurred at times but this has not changed. .  No new weakness or numbness.  Facial droop noted in ED note yesterday.  Pt with shingles on face that is being treated by primary care. I advised them to contact primary care.  Stroke symptoms reviewed with pt and her husband.  They were advised to call 911 if facial droop worsens, speech changes or if she developed new weakness or numbness.  Has not seen cardiology yet. Referral made after recent ED visit.

## 2018-08-03 NOTE — Progress Notes (Deleted)
{  Choose 1 Note Type (Telehealth Visit or Telephone Visit):[phone]}   Date:  08/03/2018   ID:  Amy [name], DOB 11-Aug-1945, MRN [id number]  {Patient [phone]::"Home"} {Provider [phone]::"Home"}  PCP:  Wenda Low, MD  Cardiologist:  No primary care provider on file. *** Electrophysiologist:  None   Evaluation Performed:  {Choose Visit Type:[phone]::"Follow-Up Visit"}  Chief Complaint:  ***  History of Present Illness:    Amy [name] is a 73 y.o. female with ***  The patient {does/does not:200015} have symptoms concerning for COVID-19 infection (fever, chills, cough, or new shortness of breath).    Past Medical History:  Diagnosis Date  . Dyspnea on exertion   . Hashimoto's thyroiditis   . Hypothyroidism   . Impaired fasting blood sugar   . Palpitations   . Parkinson's disease ([place])   . Parkinsonism ([place])   . Ventricular premature beats    Past Surgical History:  Procedure Laterality Date  . CATARACT EXTRACTION Bilateral   . CESAREAN SECTION     x2  . CHOLECYSTECTOMY    . FOOT SURGERY Right 01/15/2016     No outpatient medications have been marked as taking for the 08/06/18 encounter (Appointment) with Tommie Raymond, NP.     Allergies:   Ciprofloxacin   Social History   Tobacco Use  . Smoking status: Never Smoker  . Smokeless tobacco: Never Used  Substance Use Topics  . Alcohol use: No    Frequency: Never  . Drug use: No     Family Hx: The patient's family history includes Breast cancer in her mother; Lung disease in her brother.  ROS:   Please see the history of present illness.    *** All other systems reviewed and are negative.   Prior CV studies:   The following studies were reviewed today:  ***  Labs/Other Tests and Data Reviewed:    EKG:  {EKG/Telemetry Strips Reviewed:[phone]}  Recent Labs: 07/29/2018: ALT 10; BUN 14; Creatinine, Ser 0.85; Hemoglobin 14.5; Platelets 317; Potassium 4.2; Sodium 138    Recent Lipid Panel No results found for: CHOL, TRIG, HDL, CHOLHDL, LDLCALC, LDLDIRECT  Wt Readings from Last 3 Encounters:  06/04/18 136 lb (61.7 kg)  05/19/18 140 lb (63.5 kg)  04/21/18 143 lb (64.9 kg)     Objective:    Vital Signs:  There were no vitals taken for this visit.   {[facility] Virtual Exam (Optional):[phone]::"VITAL SIGNS:  reviewed"}  ASSESSMENT & PLAN:    1. ***  COVID-19 Education: The signs and symptoms of COVID-19 were discussed with the patient and how to seek care for testing (follow up with PCP or arrange E-visit).  ***The importance of social distancing was discussed today.  Time:   Today, I have spent *** minutes with the patient with telehealth technology discussing the above problems.     Medication Adjustments/Labs and Tests Ordered: Current medicines are reviewed at length with the patient today.  Concerns regarding medicines are outlined above.   Tests Ordered: No orders of the defined types were placed in this encounter.   Medication Changes: No orders of the defined types were placed in this encounter.   Disposition:  Follow up {follow up:15908}  Signed, Kathyrn Drown, NP  08/03/2018 10:44 AM    [place]

## 2018-08-04 DIAGNOSIS — E039 Hypothyroidism, unspecified: Secondary | ICD-10-CM | POA: Diagnosis not present

## 2018-08-04 DIAGNOSIS — B0229 Other postherpetic nervous system involvement: Secondary | ICD-10-CM | POA: Diagnosis not present

## 2018-08-04 DIAGNOSIS — G20 Parkinson's disease: Secondary | ICD-10-CM | POA: Diagnosis not present

## 2018-08-04 DIAGNOSIS — G510 Bell's palsy: Secondary | ICD-10-CM | POA: Diagnosis not present

## 2018-08-04 DIAGNOSIS — Z0000 Encounter for general adult medical examination without abnormal findings: Secondary | ICD-10-CM | POA: Diagnosis not present

## 2018-08-06 ENCOUNTER — Telehealth: Payer: Medicare Other | Admitting: Cardiology

## 2018-08-07 ENCOUNTER — Encounter: Payer: Self-pay | Admitting: Internal Medicine

## 2018-08-09 NOTE — Progress Notes (Signed)
Virtual Visit via Video Note   This visit type was conducted due to national recommendations for restrictions regarding the COVID-19 Pandemic (e.g. social distancing) in an effort to limit this patient's exposure and mitigate transmission in our community.  Due to her co-morbid illnesses, this patient is at least at moderate risk for complications without adequate follow up.  This format is felt to be most appropriate for this patient at this time.  All issues noted in this document were discussed and addressed.  A limited physical exam was performed with this format.  Please refer to the patient's chart for her consent to telehealth for [facility]   Date:  08/09/2018   ID:  Amy [name], DOB January 04, 1946, MRN [id number]  Patient Location: Home Provider Location: Home  PCP:  Wenda Low, MD   Cardiology   New     Evaluation Performed:  New Patient Evaluation  Chief Complaint:  Pt referred from ED for evaluation of chest pressure    History of Present Illness:    Amy [name] is a 73 y.o. female who wsa seen in [facility] ED on 07/29/18.  Presented with CP over past year  Patient has had a heaviness in chest for about a year    Initially only once in a while  Now it occurs more often   Pt says she is getting every day  Maybe once a day   Very strong  Does get SOB with it    When not having in feels OK though she is not too active   Pt has  Parkinsons   Activity is limited  Deneis PND  No orthopnea  No edema Does have significant reflux     Today putting things away  Got SOB  / chest  pressure   Slowed down and symptoms eased off  THis is smilar to what she has been having, what took her to ER The patient notes rare LE edema     The patient does not have symptoms concerning for COVID-19 infection (fever, chills, cough, or new shortness of breath).    Past Medical History:  Diagnosis Date  . Dyspnea on exertion   . Elevated blood sugar   . Hair loss   . Hashimoto's  thyroiditis   . Hypothyroidism   . Impaired fasting blood sugar   . Osteoporosis   . Palpitations   . Parkinson's disease ([place])   . Parkinsonism ([place])   . Ventricular premature beats   . Weakness    Past Surgical History:  Procedure Laterality Date  . CATARACT EXTRACTION Bilateral   . CESAREAN SECTION     x2  . CHOLECYSTECTOMY    . FOOT SURGERY Right 01/15/2016  . WRIST SURGERY  1999     No outpatient medications have been marked as taking for the 08/10/18 encounter (Appointment) with Fay Records, MD.     Allergies:   Ciprofloxacin   Social History   Tobacco Use  . Smoking status: Never Smoker  . Smokeless tobacco: Never Used  Substance Use Topics  . Alcohol use: No    Frequency: Never  . Drug use: No     Family Hx: The patient's family history includes Breast cancer in her mother; Lung disease in her brother.  ROS:   Please see the history of present illness.     All other systems reviewed and are negative.   Prior CV studies:   The following studies were reviewed today:    Labs/Other  Tests and Data Reviewed:    EKG:  No ECG reviewed.  On 5/20   SR 89 bpm    Recent Labs: 07/29/2018: ALT 10; BUN 14; Creatinine, Ser 0.85; Hemoglobin 14.5; Platelets 317; Potassium 4.2; Sodium 138   Recent Lipid Panel No results found for: CHOL, TRIG, HDL, CHOLHDL, LDLCALC, LDLDIRECT  Wt Readings from Last 3 Encounters:  06/04/18 136 lb (61.7 kg)  05/19/18 140 lb (63.5 kg)  04/21/18 143 lb (64.9 kg)     Objective:    Vital Signs:  There were no vitals taken for this visit.   No vitals taken  As televisit ASSESSMENT & PLAN:    1. Chest pain, pressure   COncerning   Review of labs though her LDL is excellent, off of meds   LDL is 71  But, with hx / symptoms  wIth hx she should have evaluated   I would recomm cardiac CT angiogram to evaluate    Take ASA   I would also add protonix to cover reflux  F/U will be  based on test results   2.  COVID-19 Education: The  signs and symptoms of COVID-19 were discussed with the patient and how to seek care for testing (follow up with PCP or arrange E-visit).  The importance of social distancing was discussed today.  Time:   Today, I have spent 20 minutes with the patient with telehealth technology discussing the above problems.     Medication Adjustments/Labs and Tests Ordered: Current medicines are reviewed at length with the patient today.  Concerns regarding medicines are outlined above.   Tests Ordered: No orders of the defined types were placed in this encounter.   Medication Changes: No orders of the defined types were placed in this encounter.   Disposition:  Follow up up based on test reults   Signed, Dorris Carnes, MD  08/09/2018 10:17 PM    [place]

## 2018-08-10 ENCOUNTER — Encounter: Payer: Self-pay | Admitting: Internal Medicine

## 2018-08-10 ENCOUNTER — Other Ambulatory Visit: Payer: Self-pay

## 2018-08-10 ENCOUNTER — Telehealth (INDEPENDENT_AMBULATORY_CARE_PROVIDER_SITE_OTHER): Payer: Medicare Other | Admitting: Internal Medicine

## 2018-08-10 VITALS — BP 85/57 | HR 94 | Ht 62.0 in | Wt 135.0 lb

## 2018-08-10 DIAGNOSIS — I209 Angina pectoris, unspecified: Secondary | ICD-10-CM

## 2018-08-10 DIAGNOSIS — R0789 Other chest pain: Secondary | ICD-10-CM | POA: Diagnosis not present

## 2018-08-10 DIAGNOSIS — R079 Chest pain, unspecified: Secondary | ICD-10-CM

## 2018-08-10 MED ORDER — ASPIRIN EC 81 MG PO TBEC: 81.0000 mg | DELAYED_RELEASE_TABLET | Freq: Every day | ORAL | 3 refills | Status: DC

## 2018-08-10 MED ORDER — PANTOPRAZOLE SODIUM 40 MG PO TBEC: 40.0000 mg | DELAYED_RELEASE_TABLET | Freq: Every day | ORAL | 11 refills | Status: DC

## 2018-08-10 NOTE — Patient Instructions (Addendum)
Medication Instructions:  Your physician has recommended you make the following change in your medication:  1.) start aspirin EC (enteric coated) 81 mg once a day 2.) start pantoprazole (Protonix) 40 mg once a day  If you need a refill on your cardiac medications before your next appointment, please call your pharmacy.   Lab work: none If you have labs (blood work) drawn today and your tests are completely normal, you will receive your results only by: Marland Kitchen MyChart Message (if you have MyChart) OR . A paper copy in the mail If you have any lab test that is abnormal or we need to change your treatment, we will call you to review the results.  Testing/Procedures: Your physician has requested that you have cardiac CT. Cardiac computed tomography (CT) is a painless test that uses an x-ray machine to take clear, detailed pictures of your heart. For further information please visit [facility]. Please follow instruction sheet as given.    Follow-Up: Follow up with your physician will depend on test results.  Any Other Special Instructions Will Be Listed Below (If Applicable).

## 2018-08-12 ENCOUNTER — Telehealth (HOSPITAL_COMMUNITY): Payer: Self-pay | Admitting: Emergency Medicine

## 2018-08-12 NOTE — Telephone Encounter (Signed)
Reaching out to patient to offer assistance regarding upcoming cardiac imaging study; pt verbalizes understanding of appt date/time, parking situation and where to check in, pre-test NPO status and medications ordered, and verified current allergies; name and call back number provided for further questions should they arise Marchia Bond RN [place] [facility] [phone] office [phone] cell  Pt denies covid symptoms, verbalized understanding of visitor policy however patient with parkinsons and has mobility issues, will need assistance

## 2018-08-13 ENCOUNTER — Other Ambulatory Visit: Payer: Self-pay

## 2018-08-13 ENCOUNTER — Ambulatory Visit (HOSPITAL_COMMUNITY)
Admission: RE | Admit: 2018-08-13 | Discharge: 2018-08-13 | Disposition: A | Discharge status: Home / Self Care | Payer: Medicare Other | Source: Ambulatory Visit | Attending: Internal Medicine | Admitting: Internal Medicine

## 2018-08-13 ENCOUNTER — Encounter (HOSPITAL_COMMUNITY): Payer: Self-pay

## 2018-08-13 DIAGNOSIS — R079 Chest pain, unspecified: Secondary | ICD-10-CM

## 2018-08-13 DIAGNOSIS — R0789 Other chest pain: Secondary | ICD-10-CM | POA: Diagnosis not present

## 2018-08-13 DIAGNOSIS — I209 Angina pectoris, unspecified: Secondary | ICD-10-CM

## 2018-08-13 MED ORDER — NITROGLYCERIN 0.4 MG SL SUBL
SUBLINGUAL_TABLET | SUBLINGUAL | Status: AC
  Filled 2018-08-13: qty 1

## 2018-08-13 MED ORDER — IOHEXOL 350 MG/ML SOLN
100.0000 mL | Freq: Once | INTRAVENOUS | Status: AC | PRN
  Administered 2018-08-13: 100 mL via INTRAVENOUS

## 2018-08-13 MED ORDER — NITROGLYCERIN 0.4 MG SL SUBL: 0.8000 mg | SUBLINGUAL_TABLET | Freq: Once | SUBLINGUAL | Status: DC

## 2018-08-13 MED ORDER — METOPROLOL TARTRATE 5 MG/5ML IV SOLN
10.0000 mg | INTRAVENOUS | Status: DC | PRN
  Administered 2018-08-13: 10 mg via INTRAVENOUS
  Filled 2018-08-13: qty 10

## 2018-08-13 MED ORDER — NITROGLYCERIN 0.4 MG SL SUBL
0.4000 mg | SUBLINGUAL_TABLET | Freq: Once | SUBLINGUAL | Status: AC
  Administered 2018-08-13: 14:00:00 0.4 mg via SUBLINGUAL
  Filled 2018-08-13: qty 25

## 2018-08-13 MED ORDER — METOPROLOL TARTRATE 5 MG/5ML IV SOLN
INTRAVENOUS | Status: AC
  Administered 2018-08-13: 14:00:00 10 mg via INTRAVENOUS
  Filled 2018-08-13: qty 10

## 2018-08-13 NOTE — Progress Notes (Addendum)
Notified Dr Meda Coffee of BP (see vitals). Pt states her BP runs low.  Pt is asymptomatic.   Order received to given only 1 tab nitro 0.4mg .

## 2018-08-13 NOTE — Progress Notes (Signed)
CT scan completed. Tolerated well. Dr. Meda Coffee aware of B/P, ok to D/C home. D/C home in wheelchair with husband. Awake and alert. In no distress

## 2018-08-14 ENCOUNTER — Telehealth: Payer: Self-pay

## 2018-08-14 DIAGNOSIS — B0229 Other postherpetic nervous system involvement: Secondary | ICD-10-CM | POA: Diagnosis not present

## 2018-08-14 DIAGNOSIS — G510 Bell's palsy: Secondary | ICD-10-CM | POA: Diagnosis not present

## 2018-08-14 NOTE — Telephone Encounter (Signed)
-----   Message from Dorris Carnes V, MD sent at 08/14/2018  3:10 PM EDT ----- CT of heart shows mild plaquing   I do not think symptoms are from her heart   ? GI I would recomm trial of acid inhibitor  Omeprazole   1 tablet daily Would recomm she increase her walking, improve training for SOB WIth mild plaquing of arteries need to make sure cholesterol is optimally controlled Recomm fasting lipids    I

## 2018-08-14 NOTE — Telephone Encounter (Signed)
LMTCB

## 2018-08-17 ENCOUNTER — Telehealth: Payer: Self-pay | Admitting: Internal Medicine

## 2018-08-17 DIAGNOSIS — R079 Chest pain, unspecified: Secondary | ICD-10-CM

## 2018-08-17 NOTE — Telephone Encounter (Signed)
New Message   Patient returning your call for test results.

## 2018-08-17 NOTE — Telephone Encounter (Signed)
New message:   Patient concerning some  Results from a test. Please call patinet.

## 2018-08-17 NOTE — Telephone Encounter (Signed)
Pt advised. Will come in at her convenience for a fasting lipid panel.

## 2018-08-26 ENCOUNTER — Encounter: Payer: Self-pay | Admitting: Neurology

## 2018-08-26 ENCOUNTER — Other Ambulatory Visit: Payer: Self-pay

## 2018-08-26 ENCOUNTER — Ambulatory Visit (INDEPENDENT_AMBULATORY_CARE_PROVIDER_SITE_OTHER): Payer: Medicare Other | Admitting: Neurology

## 2018-08-26 VITALS — BP 121/74 | HR 83 | Temp 97.8°F | Ht 62.0 in | Wt 138.0 lb

## 2018-08-26 DIAGNOSIS — R269 Unspecified abnormalities of gait and mobility: Secondary | ICD-10-CM

## 2018-08-26 DIAGNOSIS — I209 Angina pectoris, unspecified: Secondary | ICD-10-CM | POA: Diagnosis not present

## 2018-08-26 DIAGNOSIS — G20A1 Parkinson's disease without dyskinesia, without mention of fluctuations: Secondary | ICD-10-CM

## 2018-08-26 DIAGNOSIS — G20 Parkinson's disease: Secondary | ICD-10-CM

## 2018-08-26 DIAGNOSIS — R413 Other amnesia: Secondary | ICD-10-CM | POA: Diagnosis not present

## 2018-08-26 MED ORDER — CARBIDOPA-LEVODOPA 25-100 MG PO TABS: ORAL_TABLET | ORAL | 2 refills | Status: DC

## 2018-08-26 MED ORDER — PRAMIPEXOLE DIHYDROCHLORIDE 1 MG PO TABS: 1.0000 mg | ORAL_TABLET | Freq: Three times a day (TID) | ORAL | 3 refills | Status: DC

## 2018-08-26 MED ORDER — ENTACAPONE 200 MG PO TABS: ORAL_TABLET | ORAL | 3 refills | Status: DC

## 2018-08-26 NOTE — Progress Notes (Signed)
Reason for visit: Parkinson's disease  Amy [name] is an 73 y.o. female  History of present illness:  Ms. Holtmeyer is a 73 year old right-handed white female with a history of Parkinson's disease.  She is currently taking Sinemet 25/100 mg tablets 4 times a day and the Sinemet CR tablet 25 100 at night.  The patient takes Comtan with the regular Sinemet, she takes her medications at 8 AM, 11 AM, 2 PM, and 6 PM.  Particularly in the morning, the patient will find that the medication effects only last about an hour and 1/2 to 2 hours, she will start freezing up at that point.  The patient may have dyskinesias affecting the legs.  The patient has not had any falls but she will stumble on occasion.  She has had an improvement in the hallucinations after the Azilect was stopped and the amantadine was stopped.  The patient is no longer taking Seroquel at night.  She does have some vivid dreams at times, she may have an occasional hallucination but it is not as frequent as it was previously.  The patient recently had an episode of right sided C3 shingles.  She is getting over this currently.  Past Medical History:  Diagnosis Date  . Dyspnea on exertion   . Elevated blood sugar   . Hair loss   . Hashimoto's thyroiditis   . Hypothyroidism   . Impaired fasting blood sugar   . Osteoporosis   . Palpitations   . Parkinson's disease ([place])   . Parkinsonism ([place])   . Ventricular premature beats   . Weakness     Past Surgical History:  Procedure Laterality Date  . CATARACT EXTRACTION Bilateral   . CESAREAN SECTION     x2  . CHOLECYSTECTOMY    . FOOT SURGERY Right 01/15/2016  . WRIST SURGERY  1999    Family History  Problem Relation Age of Onset  . Breast cancer Mother   . Lung disease Brother     Social history:  reports that she has never smoked. She has never used smokeless tobacco. She reports that she does not drink alcohol or use drugs.    Allergies  Allergen Reactions  .  Ciprofloxacin Rash    Medications:  Prior to Admission medications   Medication Sig Start Date End Date Taking? Authorizing Provider  acetaminophen (TYLENOL) 500 MG tablet Take 2 tablets (1,000 mg total) by mouth every 6 (six) hours as needed. Patient taking differently: Take 1,000 mg by mouth every 6 (six) hours as needed for mild pain or headache.  02/26/17  Yes Pfeiffer, Jeannie Done, MD  alendronate (FOSAMAX) 70 MG tablet Take 70 mg by mouth once a week. Sunday 01/05/18  Yes [provider]  aspirin EC 81 MG tablet Take 1 tablet (81 mg total) by mouth daily. 08/10/18  Yes Fay Records, MD  carbidopa-levodopa (SINEMET IR) 25-100 MG tablet 1.5 tablets at 8 AM and 11AM, one tablet at 2 PM and 6 PM 08/26/18  Yes Kathrynn Ducking, MD  Carbidopa-Levodopa ER (SINEMET CR) 25-100 MG tablet controlled release TAKE 1 TABLET AT BEDTIME 06/08/18  Yes Kathrynn Ducking, MD  Carboxymethylcellul-Glycerin (CLEAR EYES FOR DRY EYES) 1-0.25 % SOLN Place 1 drop into both eyes daily as needed (dry eyes).   Yes [provider]  entacapone (COMTAN) 200 MG tablet TAKE 1 TABLET BY MOUTH 4 TIMES DAILY WITH SINEMET 08/26/18  Yes Kathrynn Ducking, MD  gabapentin (NEURONTIN) 100 MG capsule Take 100  mg by mouth as directed. 08/04/18  Yes [provider]  levothyroxine (SYNTHROID, LEVOTHROID) 50 MCG tablet Take 50 mcg daily before breakfast by mouth.   Yes [provider]  pantoprazole (PROTONIX) 40 MG tablet Take 1 tablet (40 mg total) by mouth daily. 08/10/18  Yes Fay Records, MD  pramipexole (MIRAPEX) 1 MG tablet Take 1 tablet (1 mg total) by mouth 3 (three) times daily. 08/26/18  Yes Kathrynn Ducking, MD    ROS:  Out of a complete 14 system review of symptoms, the patient complains only of the following symptoms, and all other reviewed systems are negative.  Walking problem Hallucinations Freezing spells  Blood pressure 121/74, pulse 83, temperature 97.8 F (36.6 C), temperature source  Oral, height 5\' 2"  (1.575 m), weight 138 lb (62.6 kg).  Physical Exam  General: The patient is alert and cooperative at the time of the examination.  Skin: No significant peripheral edema is noted.   Neurologic Exam  Mental status: The patient is alert and oriented x 3 at the time of the examination. The patient has apparent normal recent and remote memory, with an apparently normal attention span and concentration ability.   Cranial nerves: Facial symmetry is present. Speech is normal, no aphasia or dysarthria is noted. Extraocular movements are full. Visual fields are full.  Motor: The patient has good strength in all 4 extremities.  Sensory examination: Soft touch sensation is symmetric on the face, arms, and legs.  Coordination: The patient has good finger-nose-finger and heel-to-shin bilaterally.  Gait and station: The patient is able to arise from a seated position with arms crossed.  Once up, the patient can walk without assistance, she takes good strides and has good turns.  She does demonstrate some slight decrease in arm swing on the left, she may have some dystonic posturing of the arms on both sides.  Tandem gait is slightly unsteady.  Romberg is negative.  Reflexes: Deep tendon reflexes are symmetric.   Assessment/Plan:  1.  Parkinson's disease  2.  Gait disorder  3.  Episodic hallucinations  The patient is having freezing episodes with her Parkinson's disease with wearing off of the medications after 1-1/2 or 2 hours after each dose.  The problems are worse in the morning.  We will go up on the dosing of the Sinemet to 1.5 tablets at 8 AM and 11 AM with the Comtan.  A new prescription was sent in.  She will call for any dose adjustments before I see her back in 4 months.  A prescription for the Comtan and Mirapex were sent in.  A prescription for the Sinemet was sent in.   Jill Alexanders MD 08/26/2018 12:31 PM  [facility] [address]  [place], [place] [address]  Phone [phone] Fax [phone]

## 2018-08-26 NOTE — Patient Instructions (Signed)
We will change the Sinemet (carbidopa) to 1.5 tablets with the first and second doses, and 1 tablet with the 3rd and 4th doses.

## 2018-09-01 ENCOUNTER — Other Ambulatory Visit: Payer: Self-pay | Admitting: Neurology

## 2018-09-01 DIAGNOSIS — G20 Parkinson's disease: Secondary | ICD-10-CM

## 2018-10-12 ENCOUNTER — Telehealth: Payer: Self-pay | Admitting: Neurology

## 2018-10-12 MED ORDER — CARBIDOPA-LEVODOPA 25-100 MG PO TABS: ORAL_TABLET | ORAL | 2 refills | Status: DC

## 2018-10-12 NOTE — Telephone Encounter (Signed)
Lvm asking pt to call me back. Pt provided with # and hours for [facility].

## 2018-10-12 NOTE — Addendum Note (Signed)
Addended by: Kathrynn Ducking on: 10/12/2018 04:33 PM   Modules accepted: Orders

## 2018-10-12 NOTE — Telephone Encounter (Signed)
Pt has called asking for a call to discuss all of he prescribed medications by Dr Jannifer Franklin

## 2018-10-12 NOTE — Telephone Encounter (Signed)
Pt returned my call. She states the extra 0.5 tab of sinemet 25.100 at 8 am and 11 am  is not controlling her tremors. Pt would like to know if Dr. Jannifer Franklin would be ok with increasing the dosage or switching to a different med?  Best call back # is [phone]

## 2018-10-12 NOTE — Telephone Encounter (Signed)
I called the patient.  The patient is having some ongoing issues with mobility, we will go up on Sinemet to 1.5 tablets 4 times daily of the 25/100 mg tablets.  She will be taking this with Comtan.  If this is not helpful, we will go up to 2 tablets at a time with the Comtan.  She will call in several weeks.

## 2018-10-29 ENCOUNTER — Telehealth: Payer: Self-pay | Admitting: Neurology

## 2018-10-29 NOTE — Telephone Encounter (Signed)
Pt is asking for a call from Dr Tobey Grim RN re: her entacapone (COMTAN) 200 MG tablet carbidopa-levodopa (SINEMET IR) 25-100 MG tablet Pt made aware she and Dr Jannifer Franklin are out of the office until next week.  Pt said she will wait until then to hear from [facility]

## 2018-10-29 NOTE — Telephone Encounter (Signed)
I contacted the pt and left a vm asking the pt to call back. I advised her I am working from home today and Monday but I could talk with her when she was available.

## 2018-11-02 MED ORDER — CARBIDOPA-LEVODOPA 25-100 MG PO TABS: ORAL_TABLET | ORAL | 2 refills | Status: DC

## 2018-11-02 NOTE — Addendum Note (Signed)
Addended by: Kathrynn Ducking on: 11/02/2018 05:57 PM   Modules accepted: Orders

## 2018-11-02 NOTE — Telephone Encounter (Signed)
I called the patient.  The patient still having and dose effects from the Sinemet, she is wearing off even with the Comtan.  Will go up on the Sinemet dose taking 2 of the 25/100 mg tablets 4 times daily.  She will continue to take this with the Comtan.

## 2018-11-02 NOTE — Telephone Encounter (Signed)
I reached out to the pt. She believes her Sinemet IR and Entacapone are not providing full coverage for her sx. She feels like her meds are wearing off too soon. She reports 1 hour of sx control out of the 4 hours. Pt would like to know if she should increase the entacapone dosage? Pt is currently taking Sinemet IR 25-100 mg 1.5 tab 4 times per day, Entacapone 200 mg 1 tab 4 per day with the Sinemet IR and Sinemet CR 2-100 1 tab at bedtime.  Pt reports best call back # is [phone].

## 2018-11-11 ENCOUNTER — Other Ambulatory Visit: Payer: Self-pay

## 2018-11-11 MED ORDER — CARBIDOPA-LEVODOPA 25-100 MG PO TABS: ORAL_TABLET | ORAL | 2 refills | Status: DC

## 2018-11-30 DIAGNOSIS — Z23 Encounter for immunization: Secondary | ICD-10-CM | POA: Diagnosis not present

## 2018-12-01 ENCOUNTER — Telehealth: Payer: Self-pay

## 2018-12-01 MED ORDER — ESCITALOPRAM OXALATE 10 MG PO TABS: 10.0000 mg | ORAL_TABLET | Freq: Every day | ORAL | 1 refills | Status: DC

## 2018-12-01 NOTE — Telephone Encounter (Signed)
I reached out to the pt and we were able to schedule her f/u for 01/21/2019 at 2 pm check in time 1:30 pm.

## 2018-12-01 NOTE — Telephone Encounter (Signed)
Patient stated that she doesn't think her medication is working properly. She stated that she also has some anxiety and is unsure if it is coming from the medication or not. Please advise.

## 2018-12-01 NOTE — Addendum Note (Signed)
Addended by: Kathrynn Ducking on: 12/01/2018 02:12 PM   Modules accepted: Orders

## 2018-12-01 NOTE — Telephone Encounter (Signed)
I called the patient.  The patient is having episodes of anxiety or panic attacks, she has had these for several years but the frequency has significantly increased recently.  The patient is now having almost daily events.  The episodes may last up to 45 minutes.  I will send in a prescription for Lexapro starting at 10 mg a day and then go to 20 mg daily if needed.  The patient will need a revisit, none was scheduled after her last visit.

## 2018-12-21 DIAGNOSIS — M810 Age-related osteoporosis without current pathological fracture: Secondary | ICD-10-CM | POA: Diagnosis not present

## 2018-12-21 DIAGNOSIS — B0229 Other postherpetic nervous system involvement: Secondary | ICD-10-CM | POA: Diagnosis not present

## 2018-12-21 DIAGNOSIS — E039 Hypothyroidism, unspecified: Secondary | ICD-10-CM | POA: Diagnosis not present

## 2018-12-21 DIAGNOSIS — E785 Hyperlipidemia, unspecified: Secondary | ICD-10-CM | POA: Diagnosis not present

## 2018-12-21 DIAGNOSIS — G20 Parkinson's disease: Secondary | ICD-10-CM | POA: Diagnosis not present

## 2018-12-21 DIAGNOSIS — Z23 Encounter for immunization: Secondary | ICD-10-CM | POA: Diagnosis not present

## 2018-12-25 ENCOUNTER — Other Ambulatory Visit: Payer: Self-pay | Admitting: Neurology

## 2019-01-04 ENCOUNTER — Telehealth: Payer: Self-pay | Admitting: Neurology

## 2019-01-04 MED ORDER — CARBIDOPA-LEVODOPA 25-250 MG PO TABS: 1.0000 | ORAL_TABLET | Freq: Four times a day (QID) | ORAL | 1 refills | Status: DC

## 2019-01-04 NOTE — Telephone Encounter (Signed)
Pt called wanting to speak to MD about her issues she is having with her medications. Please advise.

## 2019-01-04 NOTE — Telephone Encounter (Signed)
I called the patient.  The Sinemet is wearing off after only 2 hours even with Comtan, I will convert to the 25/250 mg tablet taking with Comtan 4 times daily, if she is still wearing off, will need to start dosing the medications more closer together.

## 2019-01-08 ENCOUNTER — Other Ambulatory Visit: Payer: Self-pay | Admitting: Neurology

## 2019-01-21 ENCOUNTER — Telehealth: Payer: Self-pay | Admitting: Neurology

## 2019-01-21 ENCOUNTER — Ambulatory Visit: Payer: Self-pay | Admitting: Neurology

## 2019-01-21 NOTE — Telephone Encounter (Signed)
Pt arrived today not knowing that appointment had been cancelled through automated reminder call. I advised that I would send a note back trying to get a sooner appointment than the next available, please advise.

## 2019-01-21 NOTE — Telephone Encounter (Signed)
I contacted the pt and left a vm. I asked the pt to call back so we could look at rescheduling her appointment.

## 2019-01-26 NOTE — Progress Notes (Signed)
PATIENT: Amy [name] DOB: August 22, 1945  REASON FOR VISIT: follow up HISTORY FROM: patient  HISTORY OF PRESENT ILLNESS: Today 01/27/19  Amy [name] is a 73 year old female with history of Parkinson's disease.  She is currently taking Sinemet 25/250 mg 1 tablet 4 times a day, Sinemet CR tablet 25/100 at night. The Sinemet 25/250 tablet was recently added in October.  She takes Comtan with the regular Sinemet, she takes her medications at 8, 11, 2, and 6 PM.  She has had improvement in the hallucinations after the Azilect and amantadine was stopped.  She has called complaining of anxiety and panic attacks, has been started on Lexapro.  She is here today with her husband, complaining of dyskinesias all over her body, abnormal facial movement that worsens throughout the day.  She does not wake up with the movement.  They report over time as the Sinemet has been increased the altered movement has worsened.  She does report she may have some occasional hallucination.  She denies any freezing or stiffness episodes.  She describes her " wearing off" effect as when she has tremor in her right foot that will spread up her body. She has not had any recent falls.  She indicates she sleeps well at night.  She does sometimes feel dizzy when she stands up too quickly.  She reports she has been having more confusion related to the dose increase.  She presents today for follow-up accompanied by her husband.  HISTORY 08/26/2018 Dr. Jannifer Franklin: Amy [name] is a 73 year old right-handed white female with a history of Parkinson's disease.  She is currently taking Sinemet 25/100 mg tablets 4 times a day and the Sinemet CR tablet 25 100 at night.  The patient takes Comtan with the regular Sinemet, she takes her medications at 8 AM, 11 AM, 2 PM, and 6 PM.  Particularly in the morning, the patient will find that the medication effects only last about an hour and 1/2 to 2 hours, she will start freezing up at that point.  The patient may  have dyskinesias affecting the legs.  The patient has not had any falls but she will stumble on occasion.  She has had an improvement in the hallucinations after the Azilect was stopped and the amantadine was stopped.  The patient is no longer taking Seroquel at night.  She does have some vivid dreams at times, she may have an occasional hallucination but it is not as frequent as it was previously.  The patient recently had an episode of right sided C3 shingles.  She is getting over this currently.  REVIEW OF SYSTEMS: Out of a complete 14 system review of symptoms, the patient complains only of the following symptoms, and all other reviewed systems are negative.  Dyskinesia, hallucination  ALLERGIES: Allergies  Allergen Reactions  . Ciprofloxacin Rash    HOME MEDICATIONS: Outpatient Medications Prior to Visit  Medication Sig Dispense Refill  . acetaminophen (TYLENOL) 500 MG tablet Take 2 tablets (1,000 mg total) by mouth every 6 (six) hours as needed. (Patient taking differently: Take 1,000 mg by mouth every 6 (six) hours as needed for mild pain or headache. ) 30 tablet 0  . alendronate (FOSAMAX) 70 MG tablet Take 70 mg by mouth once a week. Sunday  12  . carbidopa-levodopa (SINEMET) 25-250 MG tablet Take 1 tablet by mouth 4 (four) times daily. 360 tablet 1  . Carboxymethylcellul-Glycerin (CLEAR EYES FOR DRY EYES) 1-0.25 % SOLN Place 1 drop into both eyes daily  as needed (dry eyes).    . entacapone (COMTAN) 200 MG tablet TAKE 1 TABLET BY MOUTH 4 TIMES DAILY WITH SINEMET 360 tablet 3  . escitalopram (LEXAPRO) 10 MG tablet TAKE 1 TABLET BY MOUTH EVERY DAY 90 tablet 1  . levothyroxine (SYNTHROID, LEVOTHROID) 50 MCG tablet Take 50 mcg daily before breakfast by mouth.    . pantoprazole (PROTONIX) 40 MG tablet Take 1 tablet (40 mg total) by mouth daily. 30 tablet 11  . pramipexole (MIRAPEX) 1 MG tablet Take 1 tablet (1 mg total) by mouth 3 (three) times daily. 270 tablet 3  . aspirin EC 81 MG  tablet Take 1 tablet (81 mg total) by mouth daily. (Patient not taking: Reported on 01/27/2019) 90 tablet 3  . gabapentin (NEURONTIN) 100 MG capsule Take 100 mg by mouth as directed.    . Carbidopa-Levodopa ER (SINEMET CR) 25-100 MG tablet controlled release TAKE 1 TABLET AT BEDTIME 90 tablet 3   No facility-administered medications prior to visit.     PAST MEDICAL HISTORY: Past Medical History:  Diagnosis Date  . Dyspnea on exertion   . Elevated blood sugar   . Hair loss   . Hashimoto's thyroiditis   . Hypothyroidism   . Impaired fasting blood sugar   . Osteoporosis   . Palpitations   . Parkinson's disease ([place])   . Parkinsonism ([place])   . Ventricular premature beats   . Weakness     PAST SURGICAL HISTORY: Past Surgical History:  Procedure Laterality Date  . CATARACT EXTRACTION Bilateral   . CESAREAN SECTION     x2  . CHOLECYSTECTOMY    . FOOT SURGERY Right 01/15/2016  . WRIST SURGERY  1999    FAMILY HISTORY: Family History  Problem Relation Age of Onset  . Breast cancer Mother   . Lung disease Brother     SOCIAL HISTORY: Social History   Socioeconomic History  . Marital status: Married    Spouse name: Richard  . Number of children: 2  . Years of education: 36  . Highest education level: Not on file  Occupational History  . Occupation: Prepares tax returns for [employer]  Social Needs  . Financial resource strain: Not on file  . Food insecurity    Worry: Not on file    Inability: Not on file  . Transportation needs    Medical: Not on file    Non-medical: Not on file  Tobacco Use  . Smoking status: Never Smoker  . Smokeless tobacco: Never Used  Substance and Sexual Activity  . Alcohol use: No    Frequency: Never  . Drug use: No  . Sexual activity: Not on file  Lifestyle  . Physical activity    Days per week: Not on file    Minutes per session: Not on file  . Stress: Not on file  Relationships  . Social Herbalist on phone: Not on  file    Gets together: Not on file    Attends religious service: Not on file    Active member of club or organization: Not on file    Attends meetings of clubs or organizations: Not on file    Relationship status: Not on file  . Intimate partner violence    Fear of current or ex partner: Not on file    Emotionally abused: Not on file    Physically abused: Not on file    Forced sexual activity: Not on file  Other Topics Concern  .  Not on file  Social History Narrative   Lives with husband   Caffeine use: none   Right handed       Pt uses [email or web address] for medication refills.    Phone [phone]    Fax [phone]        PHYSICAL EXAM  Vitals:   01/27/19 0911  BP: 106/72  Pulse: 73  Temp: 97.6 F (36.4 C)  Weight: 140 lb (63.5 kg)  Height: 5\' 2"  (1.575 m)   Body mass index is 25.61 kg/m.  Generalized: Well developed, in no acute distress   Neurological examination  Mentation: Alert oriented to time, place, history taking. Follows all commands speech and language fluent Cranial nerve II-XII: Pupils were equal round reactive to light. Extraocular movements were full, visual field were full on confrontational test. Facial sensation and strength were normal.  Head turning and shoulder shrug  were normal and symmetric. Motor: The motor testing reveals 5 over 5 strength of all 4 extremities. Good symmetric motor tone is noted throughout. Mildly impaired right foot tap, otherwise no rigidity or bradykinesia noted.  Generalized dyskinesia was noted in the face, trunk, arms, legs, was fidgety Sensory: Sensory testing is intact to soft touch on all 4 extremities. No evidence of extinction is noted.  Coordination: Cerebellar testing reveals good finger-nose-finger and heel-to-shin bilaterally.  Gait and station: Able to stand from a seated position without pushoff, slight decreased arm swing on the left side, turning mildly slowed, good stride Reflexes: Deep tendon reflexes  are symmetric and normal bilaterally.   DIAGNOSTIC DATA (LABS, IMAGING, TESTING) - I reviewed patient records, labs, notes, testing and imaging myself where available.  Lab Results  Component Value Date   WBC 10.0 07/29/2018   HGB 14.5 07/29/2018   HCT 43.5 07/29/2018   MCV 88.2 07/29/2018   PLT 317 07/29/2018      Component Value Date/Time   NA 138 07/29/2018 1158   K 4.2 07/29/2018 1158   CL 102 07/29/2018 1158   CO2 21 (L) 07/29/2018 1158   GLUCOSE 92 07/29/2018 1158   BUN 14 07/29/2018 1158   CREATININE 0.85 07/29/2018 1158   CALCIUM 9.5 07/29/2018 1158   PROT 7.0 07/29/2018 1158   ALBUMIN 4.0 07/29/2018 1158   AST 27 07/29/2018 1158   ALT 10 07/29/2018 1158   ALKPHOS 71 07/29/2018 1158   BILITOT 0.8 07/29/2018 1158   GFRNONAA >60 07/29/2018 1158   GFRAA >60 07/29/2018 1158   No results found for: CHOL, HDL, LDLCALC, LDLDIRECT, TRIG, CHOLHDL No results found for: HGBA1C No results found for: VITAMINB12 No results found for: TSH    ASSESSMENT AND PLAN 73 y.o. year old female  has a past medical history of Dyspnea on exertion, Elevated blood sugar, Hair loss, Hashimoto's thyroiditis, Hypothyroidism, Impaired fasting blood sugar, Osteoporosis, Palpitations, Parkinson's disease ([place]), Parkinsonism ([place]), Ventricular premature beats, and Weakness. here with:  1.  Parkinson's disease  Since June 2020, she has had dose increases of Sinemet due to report of wearing off.  In June 2020 she was taking Sinemet 1.5 tablets 4 times a day along with Comtan.  Over the last several months the medication has been increased as recent as January 04, 2019 to Sinemet 25/250 4 times a day along with Comtan.  She continues taking Sinemet CR 25/100 in the evening.  On exam, she has obvious generalized dyskinesias.  I did consult with Dr. Krista Blue, who saw the patient as well. I will decrease her dose of Sinemet back  to 25/100 1.5 tablets 4 times a day along with Comtan.  They may consider  eliminating the Sinemet 25/100 CR dose if she does well.  I will asked that they follow-up with Dr. Jannifer Franklin in 6 weeks. Dr. Krista Blue thinks the patient may be a good candidate for referral to [facility] neurology for a deep brain stimulator.  The patient has decided to hold off for now.  I did advise if her symptoms worsen or she develops any new symptoms she should let us know.   I spent 25 minutes with the patient. 50% of this time was spent discussing her plan of care.   Butler Denmark, AGNP-C, DNP 01/27/2019, 9:24 AM [facility] [address] [place],  [address] [phone]

## 2019-01-27 ENCOUNTER — Ambulatory Visit (INDEPENDENT_AMBULATORY_CARE_PROVIDER_SITE_OTHER): Payer: Medicare Other | Admitting: Neurology

## 2019-01-27 ENCOUNTER — Other Ambulatory Visit: Payer: Self-pay

## 2019-01-27 ENCOUNTER — Encounter: Payer: Self-pay | Admitting: Neurology

## 2019-01-27 VITALS — BP 106/72 | HR 73 | Temp 97.6°F | Ht 62.0 in | Wt 140.0 lb

## 2019-01-27 DIAGNOSIS — G20 Parkinson's disease: Secondary | ICD-10-CM | POA: Diagnosis not present

## 2019-01-27 MED ORDER — CARBIDOPA-LEVODOPA 25-100 MG PO TABS: ORAL_TABLET | ORAL | 1 refills | Status: DC

## 2019-01-27 NOTE — Patient Instructions (Signed)
We will go back to your previous dose of Sinement 25-100 1.5 tablets 4 times a day.

## 2019-01-27 NOTE — Telephone Encounter (Signed)
I contacted the pt back. She has an appt today with SS,NP and is going to keep this appt.  Pt may schedule f/u with Dr. Jannifer Franklin in Dec. After visit with SS, NP today.

## 2019-01-28 NOTE — Progress Notes (Signed)
I have read the note, and I agree with the clinical assessment and plan.  Charles K Willis   

## 2019-02-01 ENCOUNTER — Telehealth: Payer: Self-pay | Admitting: Neurology

## 2019-02-01 NOTE — Telephone Encounter (Signed)
1) Medication(s) Requested (by name): carbidopa-levodopa (SINEMET) 25-100 MG tablet   2) Pharmacy of Choice: [id number] - [place], [place] [address] 3) Special Requests:   Patient called to report that the pharmacy will not authorize the fill for this medication since they recently received a fill for the carbidopa-levodopa at 25-250  Please follow up.

## 2019-02-01 NOTE — Telephone Encounter (Addendum)
[facility] and [facility] cancelled the 01-30-19 order as they saw that local pharmacy got this and gave her 2 wks worth.  [facility]19 25/100 CR takes one at [facility]hs.  #90.  I spoke to both pt and husband.  Relayed that local pharmacy (spoke to [place]) that when 3 days 02-09-19 left of last fill they can get a 30 or 90 days fill of new prescription 01-30-19 25/100 1.5tabs po qid.  They verbalized understanding.  [facility] other [facility] unless [facility] wants to resume there.

## 2019-03-01 ENCOUNTER — Ambulatory Visit (INDEPENDENT_AMBULATORY_CARE_PROVIDER_SITE_OTHER): Payer: Medicare Other | Admitting: Neurology

## 2019-03-01 ENCOUNTER — Other Ambulatory Visit: Payer: Self-pay

## 2019-03-01 ENCOUNTER — Encounter: Payer: Self-pay | Admitting: Neurology

## 2019-03-01 VITALS — BP 108/62 | HR 84 | Temp 97.2°F | Ht 62.0 in | Wt 136.0 lb

## 2019-03-01 DIAGNOSIS — R413 Other amnesia: Secondary | ICD-10-CM

## 2019-03-01 DIAGNOSIS — I209 Angina pectoris, unspecified: Secondary | ICD-10-CM | POA: Diagnosis not present

## 2019-03-01 DIAGNOSIS — G20 Parkinson's disease: Secondary | ICD-10-CM | POA: Diagnosis not present

## 2019-03-01 MED ORDER — BACLOFEN 10 MG PO TABS: 5.0000 mg | ORAL_TABLET | Freq: Every day | ORAL | 2 refills | Status: DC

## 2019-03-01 MED ORDER — CARBIDOPA-LEVODOPA 25-100 MG PO TABS: ORAL_TABLET | ORAL | 1 refills | Status: DC

## 2019-03-01 NOTE — Patient Instructions (Signed)
We will begin the cabidopa 1.5 tablets every three hours.  Stop the Comtan.  Use baclofen 10 mg, 1/2 tablet at night for the muscle cramps.

## 2019-03-01 NOTE — Progress Notes (Signed)
Reason for visit: Parkinson's disease, memory disorder  Amy [name] is an 73 y.o. female  History of present illness:  Amy [name] is a 73 year old right-handed white female with a history of Parkinson's disease.  The patient has not done well.  She has had difficulty with wearing off effects from her medications that may occur after 2 or 3 hours after she takes her medication.  The patient is on Comtan which has made very little difference in the duration of the medication effect.  She will start becoming somewhat fidgety, then she will freeze up, she may have severe cramps or muscle dystonia with these events, this may take 2 hours or so for her to get over.  The patient is not sleeping well at night because of this, the cramps tend to occur in the calf muscles bilaterally.  She is having some burning episodes in the mouth that again may last up to 2 hours, she does have some dry mouth.  She has had hallucinations on higher doses of the Sinemet when she was taking the 25/250 mg tablets 4 times daily.  She is not able to walk at all when she freezes up.  She comes back to this office for an evaluation.  She is still having some hallucinations on the lower dose of the 1.5 tablets of the 25/100 mg tablets, her husband is giving her the medication every 2 or 3 hours.  Past Medical History:  Diagnosis Date  . Dyspnea on exertion   . Elevated blood sugar   . Hair loss   . Hashimoto's thyroiditis   . Hypothyroidism   . Impaired fasting blood sugar   . Osteoporosis   . Palpitations   . Parkinson's disease ([place])   . Parkinsonism ([place])   . Ventricular premature beats   . Weakness     Past Surgical History:  Procedure Laterality Date  . CATARACT EXTRACTION Bilateral   . CESAREAN SECTION     x2  . CHOLECYSTECTOMY    . FOOT SURGERY Right 01/15/2016  . WRIST SURGERY  1999    Family History  Problem Relation Age of Onset  . Breast cancer Mother   . Lung disease Brother     Social  history:  reports that she has never smoked. She has never used smokeless tobacco. She reports that she does not drink alcohol or use drugs.    Allergies  Allergen Reactions  . Ciprofloxacin Rash    Medications:  Prior to Admission medications   Medication Sig Start Date End Date Taking? Authorizing Provider  acetaminophen (TYLENOL) 500 MG tablet Take 2 tablets (1,000 mg total) by mouth every 6 (six) hours as needed. Patient taking differently: Take 1,000 mg by mouth every 6 (six) hours as needed for mild pain or headache.  02/26/17  Yes Pfeiffer, Jeannie Done, MD  alendronate (FOSAMAX) 70 MG tablet Take 70 mg by mouth once a week. Sunday 01/05/18  Yes [provider]  aspirin EC 81 MG tablet Take 1 tablet (81 mg total) by mouth daily. 08/10/18  Yes Fay Records, MD  carbidopa-levodopa (SINEMET) 25-100 MG tablet Take 1.5 tablets 4 times a day 01/27/19  Yes Suzzanne Cloud, NP  Carboxymethylcellul-Glycerin (CLEAR EYES FOR DRY EYES) 1-0.25 % SOLN Place 1 drop into both eyes daily as needed (dry eyes).   Yes [provider]  entacapone (COMTAN) 200 MG tablet TAKE 1 TABLET BY MOUTH 4 TIMES DAILY WITH SINEMET 08/26/18  Yes Kathrynn Ducking, MD  escitalopram (LEXAPRO) 10 MG tablet TAKE 1 TABLET BY MOUTH EVERY DAY 12/28/18  Yes Kathrynn Ducking, MD  levothyroxine (SYNTHROID, LEVOTHROID) 50 MCG tablet Take 50 mcg daily before breakfast by mouth.   Yes [provider]  pantoprazole (PROTONIX) 40 MG tablet Take 1 tablet (40 mg total) by mouth daily. 08/10/18  Yes Fay Records, MD  pramipexole (MIRAPEX) 1 MG tablet Take 1 tablet (1 mg total) by mouth 3 (three) times daily. 08/26/18  Yes Kathrynn Ducking, MD  QUEtiapine (SEROQUEL) 25 MG tablet TAKE 2 TABLETS (50 MG TOTAL) BY MOUTH AT BEDTIME. 10/16/18   [provider]  SYNTHROID 75 MCG tablet  11/16/18   [provider]    ROS:  Out of a complete 14 system review of symptoms, the patient complains only of the  following symptoms, and all other reviewed systems are negative.  Muscle cramps Swallowing problems Memory troubles Freezing, walking problems  Blood pressure 108/62, pulse 84, temperature (!) 97.2 F (36.2 C), height 5\' 2"  (1.575 m), weight 136 lb (61.7 kg).  Physical Exam  General: The patient is alert and cooperative at the time of the examination.  The patient appears to be somewhat fidgety during the examination.  Skin: No significant peripheral edema is noted.   Neurologic Exam  Mental status: The patient is alert and oriented x 3 at the time of the examination. The patient has apparent normal recent and remote memory, with an apparently normal attention span and concentration ability.   Cranial nerves: Facial symmetry is present. Speech is normal, no aphasia or dysarthria is noted. Extraocular movements are full. Visual fields are full.  Mild masking the face is seen.  Lid lag is noted with vertical eye movements.  Motor: The patient has good strength in all 4 extremities.  Sensory examination: Soft touch sensation is symmetric on the face, arms, and legs.  Coordination: The patient has good finger-nose-finger and heel-to-shin bilaterally.  The patient has some apraxia with use of the lower extremities.  Gait and station: The patient is able to arise from a seated position with arms crossed.  Once up, she is able to walk independently but does have some slight gait instability.  Romberg is negative.  No drift is seen.  Reflexes: Deep tendon reflexes are symmetric.   Assessment/Plan:  1.  Parkinson's disease  2.  Gait disorder  3.  Memory disorder  The patient is having significant issues with control of her Parkinson symptoms.  She indicates that the medication laterally about 2 hours, may be 3 hours.  We will dose the Sinemet 1.5 tablets every 3 hours, a prescription was sent in.  She will stop the Comtan.  She will be given baclofen at night for muscle cramps  taking 5 mg, she will call for any dose adjustments.  She will follow-up here in 4 months.  Jill Alexanders MD 03/01/2019 11:23 AM  [facility] [address] [place], [place] [address]  Phone [phone] Fax [phone]

## 2019-04-28 DIAGNOSIS — Z7189 Other specified counseling: Secondary | ICD-10-CM | POA: Diagnosis not present

## 2019-04-28 DIAGNOSIS — E039 Hypothyroidism, unspecified: Secondary | ICD-10-CM | POA: Diagnosis not present

## 2019-04-28 DIAGNOSIS — E063 Autoimmune thyroiditis: Secondary | ICD-10-CM | POA: Diagnosis not present

## 2019-05-04 ENCOUNTER — Encounter: Payer: Self-pay | Admitting: Neurology

## 2019-05-04 ENCOUNTER — Ambulatory Visit (INDEPENDENT_AMBULATORY_CARE_PROVIDER_SITE_OTHER): Payer: Medicare Other | Admitting: Neurology

## 2019-05-04 ENCOUNTER — Other Ambulatory Visit: Payer: Self-pay

## 2019-05-04 VITALS — BP 84/58 | HR 87 | Temp 97.9°F | Ht 62.0 in

## 2019-05-04 DIAGNOSIS — G20 Parkinson's disease: Secondary | ICD-10-CM | POA: Diagnosis not present

## 2019-05-04 MED ORDER — BACLOFEN 10 MG PO TABS: 5.0000 mg | ORAL_TABLET | Freq: Two times a day (BID) | ORAL | 2 refills | Status: DC | PRN

## 2019-05-04 NOTE — Progress Notes (Addendum)
PATIENT: Amy [name] DOB: 1945-06-27  REASON FOR VISIT: follow up HISTORY FROM: patient  HISTORY OF PRESENT ILLNESS: Today 05/04/19  Amy [name] is a 74 year old female with Parkinson's disease.  She has not done well with her Parkinson's.  She last saw Dr. Jannifer Franklin 03/01/2019, at that time, the Comtan was stopped.  She was having issues with the Sinemet wearing off.  She is taking Sinemet 1.5 tablets every 3 hours.  She is on baclofen for leg cramps. She continues to have generalized dyskinesias, becomes fidgety as result of Sinemet. However, she needs Sinemet earlier than every 3 hours. She says she knows she needs it, because she will have severe cramps in her legs, with freezing episodes. She says an hour after taking the Sinemet, the sensation subsides. She has been taking baclofen at bedtime, which does seem to be helping the cramps. She has significant anxiety. She has continued hallucinations, but they have been worse previously. Her husband is wondering whether or not she should start the amantadine again. Apparently, she stopped this due to hallucinations. She presents today for evaluation accompanied by her husband.  HISTORY 03/01/2019 Dr. Jannifer Franklin: Amy [name] is a 74 year old right-handed white female with a history of Parkinson's disease.  The patient has not done well.  She has had difficulty with wearing off effects from her medications that may occur after 2 or 3 hours after she takes her medication.  The patient is on Comtan which has made very little difference in the duration of the medication effect.  She will start becoming somewhat fidgety, then she will freeze up, she may have severe cramps or muscle dystonia with these events, this may take 2 hours or so for her to get over.  The patient is not sleeping well at night because of this, the cramps tend to occur in the calf muscles bilaterally.  She is having some burning episodes in the mouth that again may last up to 2 hours, she does  have some dry mouth.  She has had hallucinations on higher doses of the Sinemet when she was taking the 25/250 mg tablets 4 times daily.  She is not able to walk at all when she freezes up.  She comes back to this office for an evaluation.  She is still having some hallucinations on the lower dose of the 1.5 tablets of the 25/100 mg tablets, her husband is giving her the medication every 2 or 3 hours.   REVIEW OF SYSTEMS: Out of a complete 14 system review of symptoms, the patient complains only of the following symptoms, and all other reviewed systems are negative.  Tremor, restless leg, hallucination  ALLERGIES: Allergies  Allergen Reactions  . Ciprofloxacin Rash    HOME MEDICATIONS: Outpatient Medications Prior to Visit  Medication Sig Dispense Refill  . acetaminophen (TYLENOL) 500 MG tablet Take 2 tablets (1,000 mg total) by mouth every 6 (six) hours as needed. (Patient taking differently: Take 1,000 mg by mouth every 6 (six) hours as needed for mild pain or headache. ) 30 tablet 0  . alendronate (FOSAMAX) 70 MG tablet Take 70 mg by mouth once a week. Sunday  12  . aspirin EC 81 MG tablet Take 1 tablet (81 mg total) by mouth daily. 90 tablet 3  . carbidopa-levodopa (SINEMET) 25-100 MG tablet Take 1.5 tablets every three hours 1080 tablet 1  . Carboxymethylcellul-Glycerin (CLEAR EYES FOR DRY EYES) 1-0.25 % SOLN Place 1 drop into both eyes daily as needed (dry eyes).    Marland Kitchen  entacapone (COMTAN) 200 MG tablet Take 200 mg by mouth 4 (four) times daily.    Marland Kitchen escitalopram (LEXAPRO) 10 MG tablet TAKE 1 TABLET BY MOUTH EVERY DAY 90 tablet 1  . levothyroxine (SYNTHROID, LEVOTHROID) 50 MCG tablet Take 50 mcg daily before breakfast by mouth.    . pantoprazole (PROTONIX) 40 MG tablet Take 1 tablet (40 mg total) by mouth daily. 30 tablet 11  . pramipexole (MIRAPEX) 1 MG tablet Take 1 tablet (1 mg total) by mouth 3 (three) times daily. 270 tablet 3  . QUEtiapine (SEROQUEL) 25 MG tablet TAKE 2 TABLETS (50  MG TOTAL) BY MOUTH AT BEDTIME.    . SYNTHROID 75 MCG tablet     . baclofen (LIORESAL) 10 MG tablet Take 0.5 tablets (5 mg total) by mouth at bedtime. 30 each 2   No facility-administered medications prior to visit.    PAST MEDICAL HISTORY: Past Medical History:  Diagnosis Date  . Dyspnea on exertion   . Elevated blood sugar   . Hair loss   . Hashimoto's thyroiditis   . Hypothyroidism   . Impaired fasting blood sugar   . Osteoporosis   . Palpitations   . Parkinson's disease ([place])   . Parkinsonism ([place])   . Ventricular premature beats   . Weakness     PAST SURGICAL HISTORY: Past Surgical History:  Procedure Laterality Date  . CATARACT EXTRACTION Bilateral   . CESAREAN SECTION     x2  . CHOLECYSTECTOMY    . FOOT SURGERY Right 01/15/2016  . WRIST SURGERY  1999    FAMILY HISTORY: Family History  Problem Relation Age of Onset  . Breast cancer Mother   . Lung disease Brother     SOCIAL HISTORY: Social History   Socioeconomic History  . Marital status: Married    Spouse name: Richard  . Number of children: 2  . Years of education: 15  . Highest education level: Not on file  Occupational History  . Occupation: Prepares tax returns for [employer]  Tobacco Use  . Smoking status: Never Smoker  . Smokeless tobacco: Never Used  Substance and Sexual Activity  . Alcohol use: No  . Drug use: No  . Sexual activity: Not on file  Other Topics Concern  . Not on file  Social History Narrative   Lives with husband   Caffeine use: none   Right handed       Pt uses [email or web address] for medication refills.    Phone [phone]    Fax [phone]     Social Determinants of Health   Financial Resource Strain:   . Difficulty of Paying Living Expenses: Not on file  Food Insecurity:   . Worried About Charity fundraiser in the Last Year: Not on file  . Ran Out of Food in the Last Year: Not on file  Transportation Needs:   . Lack of Transportation (Medical): Not on  file  . Lack of Transportation (Non-Medical): Not on file  Physical Activity:   . Days of Exercise per Week: Not on file  . Minutes of Exercise per Session: Not on file  Stress:   . Feeling of Stress : Not on file  Social Connections:   . Frequency of Communication with Friends and Family: Not on file  . Frequency of Social Gatherings with Friends and Family: Not on file  . Attends Religious Services: Not on file  . Active Member of Clubs or Organizations: Not on file  . Attends Club  or Organization Meetings: Not on file  . Marital Status: Not on file  Intimate Partner Violence:   . Fear of Current or Ex-Partner: Not on file  . Emotionally Abused: Not on file  . Physically Abused: Not on file  . Sexually Abused: Not on file   PHYSICAL EXAM  Vitals:   05/04/19 1337  BP: (!) 84/58  Pulse: 87  Temp: 97.9 F (36.6 C)  TempSrc: Oral  Height: 5\' 2"  (1.575 m)   Body mass index is 24.87 kg/m.  Generalized: Well developed, in no acute distress, manual blood pressure was 95/60, fairly significantly fidgety during exam, masking of the face is seen  Neurological examination  Mentation: Alert oriented to time, place, history taking. Follows all commands speech and language fluent Cranial nerve II-XII: Pupils were equal round reactive to light. Extraocular movements were full, visual field were full on confrontational test. Facial sensation and strength were normal. Head turning and shoulder shrug were normal and symmetric. Motor: Good strength of all extremities. Generalized dyskinesia was noted in the face, arms, legs. Sensory: Sensory testing is intact to soft touch on all 4 extremities. No evidence of extinction is noted.  Coordination: Cerebellar testing reveals good finger-nose-finger and heel-to-shin bilaterally.  Gait and station: Slight gait instability, but overall fairly stable, mild difficulty with turns Reflexes: Deep tendon reflexes are symmetric  DIAGNOSTIC DATA (LABS,  IMAGING, TESTING) - I reviewed patient records, labs, notes, testing and imaging myself where available.  Lab Results  Component Value Date   WBC 10.0 07/29/2018   HGB 14.5 07/29/2018   HCT 43.5 07/29/2018   MCV 88.2 07/29/2018   PLT 317 07/29/2018      Component Value Date/Time   NA 138 07/29/2018 1158   K 4.2 07/29/2018 1158   CL 102 07/29/2018 1158   CO2 21 (L) 07/29/2018 1158   GLUCOSE 92 07/29/2018 1158   BUN 14 07/29/2018 1158   CREATININE 0.85 07/29/2018 1158   CALCIUM 9.5 07/29/2018 1158   PROT 7.0 07/29/2018 1158   ALBUMIN 4.0 07/29/2018 1158   AST 27 07/29/2018 1158   ALT 10 07/29/2018 1158   ALKPHOS 71 07/29/2018 1158   BILITOT 0.8 07/29/2018 1158   GFRNONAA >60 07/29/2018 1158   GFRAA >60 07/29/2018 1158   No results found for: CHOL, HDL, LDLCALC, LDLDIRECT, TRIG, CHOLHDL No results found for: HGBA1C No results found for: VITAMINB12 No results found for: TSH  ASSESSMENT AND PLAN 74 y.o. year old female  has a past medical history of Dyspnea on exertion, Elevated blood sugar, Hair loss, Hashimoto's thyroiditis, Hypothyroidism, Impaired fasting blood sugar, Osteoporosis, Palpitations, Parkinson's disease ([place]), Parkinsonism ([place]), Ventricular premature beats, and Weakness. here with:  1. Parkinson's disease 2. Gait disorder  She continues to report significant issues with control of her Parkinson symptoms. She last saw Dr. Jannifer Franklin 03/01/2019, at that time her Sinemet was adjusted to 1.5 tablets every 3 hours. The Comtan was stopped. Today, they are complaining of fidgety/dyskinesia movement for several months. Her husband is having to give her the Sinemet about every 2 hours, as result of what she describes as severe cramping and subsequent freezing of her lower extremities. The baclofen at bedtime has seemed to be helpful for the cramps. I will adjust the prescription for her to take 5 mg twice a day. Her husband is questioning whether or not she should be restarted  on amantadine. It seems this was discontinued as result of hallucinations. She continues to have mild hallucinations at this  time. I will consult Dr. Jannifer Franklin, to see what his recommendation may be. Her complaints today are not new, have been going on for several months. She will keep her scheduled follow-up appointment with Dr. Jannifer Franklin in April 2021.  Addendum 05/05/2019: I consulted Dr. Jannifer Franklin, he suggested restarting amantadine 50 mg twice a day, increasing very slowly, I will send in the liquid preparation.  If this helps with the dyskinesias, but not the freezing episodes, she may be a candidate for Kynmobi. I called her husband, he agrees, prefers liquid preparation.  I spent 25 minutes with the patient. 50% of this time was spent discussing her plan of care.  Butler Denmark, AGNP-C, DNP 05/04/2019, 4:28 PM [facility] [address], [place], [place] [address] [phone]

## 2019-05-04 NOTE — Patient Instructions (Signed)
For now continue current medications, I will consult with Dr. Jannifer Franklin about your medications and will make adjustments based on his recommendations.   Keep the baclofen, may take as needed during the day

## 2019-05-05 ENCOUNTER — Other Ambulatory Visit: Payer: Self-pay | Admitting: Neurology

## 2019-05-05 MED ORDER — AMANTADINE HCL 50 MG/5ML PO SYRP: 50.0000 mg | ORAL_SOLUTION | Freq: Two times a day (BID) | ORAL | 2 refills | Status: DC

## 2019-05-05 NOTE — Addendum Note (Signed)
Addended by: Suzzanne Cloud on: 05/05/2019 11:57 AM   Modules accepted: Orders

## 2019-05-05 NOTE — Progress Notes (Signed)
I have read the note, and I agree with the clinical assessment and plan.  Rodman Recupero K Aaryan Essman   

## 2019-05-17 ENCOUNTER — Other Ambulatory Visit: Payer: Self-pay | Admitting: Neurology

## 2019-05-17 DIAGNOSIS — T1511XA Foreign body in conjunctival sac, right eye, initial encounter: Secondary | ICD-10-CM | POA: Diagnosis not present

## 2019-05-17 DIAGNOSIS — H18593 Other hereditary corneal dystrophies, bilateral: Secondary | ICD-10-CM | POA: Diagnosis not present

## 2019-05-20 ENCOUNTER — Ambulatory Visit: Payer: Medicare Other | Attending: Internal Medicine

## 2019-05-20 DIAGNOSIS — Z23 Encounter for immunization: Secondary | ICD-10-CM

## 2019-05-20 NOTE — Progress Notes (Signed)
   Covid-19 Vaccination Clinic  Name:  Amy [name]    MRN: [id number] DOB: 1945-04-12  05/20/2019  Amy [name] was observed post Covid-19 immunization for 15 minutes without incident. She was provided with Vaccine Information Sheet and instruction to access the V-Safe system.   Amy [name] was instructed to call 911 with any severe reactions post vaccine: Marland Kitchen Difficulty breathing  . Swelling of face and throat  . A fast heartbeat  . A bad rash all over body  . Dizziness and weakness   Immunizations Administered    Name Date Dose VIS Date Route   Pfizer COVID-19 Vaccine 05/20/2019  1:55 PM 0.3 mL 02/19/2019 Intramuscular   Manufacturer: [place]   Lot: [id number]   [place]: [id number]

## 2019-06-14 ENCOUNTER — Ambulatory Visit: Payer: Medicare Other | Attending: Internal Medicine

## 2019-06-14 DIAGNOSIS — Z23 Encounter for immunization: Secondary | ICD-10-CM

## 2019-06-14 NOTE — Progress Notes (Signed)
   Covid-19 Vaccination Clinic  Name:  Marren Lull    MRN: [id number] DOB: 12/02/1945  06/14/2019  Ms. Hemmingsen was observed post Covid-19 immunization for 15 minutes without incident. She was provided with Vaccine Information Sheet and instruction to access the V-Safe system.   Ms. Thammavong was instructed to call 911 with any severe reactions post vaccine: Marland Kitchen Difficulty breathing  . Swelling of face and throat  . A fast heartbeat  . A bad rash all over body  . Dizziness and weakness   Immunizations Administered    Name Date Dose VIS Date Route   Pfizer COVID-19 Vaccine 06/14/2019 12:24 PM 0.3 mL 02/19/2019 Intramuscular   Manufacturer: [place]   Lot: [id number]   [place]: [id number]

## 2019-06-21 ENCOUNTER — Other Ambulatory Visit: Payer: Self-pay | Admitting: Neurology

## 2019-06-21 ENCOUNTER — Encounter: Payer: Self-pay | Admitting: Neurology

## 2019-06-21 ENCOUNTER — Ambulatory Visit (INDEPENDENT_AMBULATORY_CARE_PROVIDER_SITE_OTHER): Payer: Medicare Other | Admitting: Neurology

## 2019-06-21 ENCOUNTER — Other Ambulatory Visit: Payer: Self-pay

## 2019-06-21 VITALS — BP 106/67 | HR 89 | Temp 97.2°F | Ht 62.0 in | Wt 135.0 lb

## 2019-06-21 DIAGNOSIS — R413 Other amnesia: Secondary | ICD-10-CM

## 2019-06-21 DIAGNOSIS — G2581 Restless legs syndrome: Secondary | ICD-10-CM | POA: Diagnosis not present

## 2019-06-21 DIAGNOSIS — G20 Parkinson's disease: Secondary | ICD-10-CM

## 2019-06-21 HISTORY — DX: Restless legs syndrome: G25.81

## 2019-06-21 MED ORDER — AMANTADINE HCL 50 MG/5ML PO SYRP: 50.0000 mg | ORAL_SOLUTION | Freq: Three times a day (TID) | ORAL | 5 refills | Status: DC

## 2019-06-21 MED ORDER — ONGENTYS 50 MG PO CAPS: 50.0000 mg | ORAL_CAPSULE | Freq: Every day | ORAL | 4 refills | Status: DC

## 2019-06-21 NOTE — Patient Instructions (Signed)
We will start Ongentys to take 50 mg daily, when this medication is started, stop the Comtan.  We will go up on the symmetrel to 50 mg three times a day.

## 2019-06-21 NOTE — Progress Notes (Signed)
Reason for visit: Parkinson's disease, memory disorder, gait disorder  Amy [name] is an 74 y.o. female  History of present illness:  Amy [name] is a 74 year old right-handed white female with a history of Parkinson's disease associated with memory disorder.  The patient has not had any falls since last seen but she continues to have frequent freezing episodes.  She generally does well following her first dose of Sinemet in the morning, she gets Sinemet every 3 hours or so.  After her second dose, she generally will become "fidgety" with the legs with a tight sensation in the legs consistent with restless leg syndrome.  She will have a tendency to freeze more.  She generally does better and functions better in the afternoon.  She may walk about a mile and a half a day.  She at times will note that she has difficulty keeping her eyes open.  She does have a lot of daytime drowsiness as well, she may act out her dreams at night.  Memory remains an ongoing issue.  The husband believes that the amantadine has helped some of the dyskinesias during the day at 50 mg twice daily.  Past Medical History:  Diagnosis Date  . Dyspnea on exertion   . Elevated blood sugar   . Hair loss   . Hashimoto's thyroiditis   . Hypothyroidism   . Impaired fasting blood sugar   . Osteoporosis   . Palpitations   . Parkinson's disease ([place])   . Parkinsonism ([place])   . Ventricular premature beats   . Weakness     Past Surgical History:  Procedure Laterality Date  . CATARACT EXTRACTION Bilateral   . CESAREAN SECTION     x2  . CHOLECYSTECTOMY    . FOOT SURGERY Right 01/15/2016  . WRIST SURGERY  1999    Family History  Problem Relation Age of Onset  . Breast cancer Mother   . Lung disease Brother     Social history:  reports that she has never smoked. She has never used smokeless tobacco. She reports that she does not drink alcohol or use drugs.    Allergies  Allergen Reactions  . Ciprofloxacin Rash      Medications:  Prior to Admission medications   Medication Sig Start Date End Date Taking? Authorizing Provider  acetaminophen (TYLENOL) 500 MG tablet Take 2 tablets (1,000 mg total) by mouth every 6 (six) hours as needed. Patient taking differently: Take 1,000 mg by mouth every 6 (six) hours as needed for mild pain or headache.  02/26/17  Yes Pfeiffer, Jeannie Done, MD  alendronate (FOSAMAX) 70 MG tablet Take 70 mg by mouth once a week. Sunday 01/05/18  Yes [provider]  amantadine (SYMMETREL) 50 MG/5ML solution Take 5 mLs (50 mg total) by mouth 2 (two) times daily. 05/05/19  Yes Suzzanne Cloud, NP  aspirin EC 81 MG tablet Take 1 tablet (81 mg total) by mouth daily. 08/10/18  Yes Fay Records, MD  baclofen (LIORESAL) 10 MG tablet Take 0.5 tablets (5 mg total) by mouth 2 (two) times daily as needed for muscle spasms. 05/04/19  Yes Suzzanne Cloud, NP  carbidopa-levodopa (SINEMET) 25-100 MG tablet Take 1.5 tablets every three hours 03/01/19  Yes Kathrynn Ducking, MD  Carbidopa-Levodopa ER (SINEMET CR) 25-100 MG tablet controlled release TAKE 1 TABLET AT BEDTIME 05/18/19  Yes Kathrynn Ducking, MD  Carboxymethylcellul-Glycerin (CLEAR EYES FOR DRY EYES) 1-0.25 % SOLN Place 1 drop into both eyes daily as  needed (dry eyes).   Yes [provider]  entacapone (COMTAN) 200 MG tablet Take 200 mg by mouth 4 (four) times daily. 04/28/19  Yes [provider]  escitalopram (LEXAPRO) 10 MG tablet TAKE 1 TABLET BY MOUTH EVERY DAY 12/28/18  Yes Kathrynn Ducking, MD  levothyroxine (SYNTHROID, LEVOTHROID) 50 MCG tablet Take 50 mcg daily before breakfast by mouth.   Yes [provider]  pantoprazole (PROTONIX) 40 MG tablet Take 1 tablet (40 mg total) by mouth daily. 08/10/18  Yes Fay Records, MD  pramipexole (MIRAPEX) 1 MG tablet Take 1 tablet (1 mg total) by mouth 3 (three) times daily. 08/26/18  Yes Kathrynn Ducking, MD  QUEtiapine (SEROQUEL) 25 MG tablet TAKE 2 TABLETS (50 MG TOTAL)  BY MOUTH AT BEDTIME. 10/16/18  Yes [provider]  SYNTHROID 75 MCG tablet  11/16/18  Yes [provider]    ROS:  Out of a complete 14 system review of symptoms, the patient complains only of the following symptoms, and all other reviewed systems are negative.  Memory problems Walking difficulty Leg tightness, discomfort  Blood pressure 106/67, pulse 89, temperature (!) 97.2 F (36.2 C), height 5\' 2"  (1.575 m), weight 135 lb (61.2 kg).  Physical Exam  General: The patient is alert and cooperative at the time of the examination.  Skin: No significant peripheral edema is noted.   Neurologic Exam  Mental status: The patient is alert and oriented x 3 at the time of the examination. The patient has apparent normal recent and remote memory, with an apparently normal attention span and concentration ability.   Cranial nerves: Facial symmetry is present. Speech is normal, no aphasia or dysarthria is noted. Extraocular movements are full. Visual fields are full.  Motor: The patient has good strength in all 4 extremities.  Sensory examination: Soft touch sensation is symmetric on the face, arms, and legs.  Coordination: The patient has good finger-nose-finger and heel-to-shin bilaterally.  Gait and station: The patient is able to arise from a seated position with arms crossed.  Once up, she is able to ambulate without assistance but does freeze frequently has some hesitation with turns.  Romberg is negative, no drift is seen.  Tandem gait was not attempted.  Reflexes: Deep tendon reflexes are symmetric.   Assessment/Plan:  1.  Parkinson's disease  2.  Memory disorder  3.  Restless leg syndrome  4.  Gait disorder  The patient appears to be describing problems with restless leg syndrome and now is having occasional episodes of eye opening apraxia.  After her second Sinemet dose she develops some fidgety movements of the legs and has a sensation of restless leg  syndrome, and increased troubles with walking and freezing.  The patient will be increased on the Symmetrel taking 50 mg 3 times daily.  We will take her off of the Comtan and switch her to Ongentys.  She will follow-up here in 4 months.  She does not wish a sleep referral currently for the daytime drowsiness.  Jill Alexanders MD 06/21/2019 10:45 AM  [facility] [address] [place], [place] [address]  Phone [phone] Fax [phone]

## 2019-06-22 DIAGNOSIS — E039 Hypothyroidism, unspecified: Secondary | ICD-10-CM | POA: Diagnosis not present

## 2019-06-22 DIAGNOSIS — F411 Generalized anxiety disorder: Secondary | ICD-10-CM | POA: Diagnosis not present

## 2019-06-22 DIAGNOSIS — E785 Hyperlipidemia, unspecified: Secondary | ICD-10-CM | POA: Diagnosis not present

## 2019-06-22 DIAGNOSIS — G2581 Restless legs syndrome: Secondary | ICD-10-CM | POA: Diagnosis not present

## 2019-06-22 DIAGNOSIS — G20 Parkinson's disease: Secondary | ICD-10-CM | POA: Diagnosis not present

## 2019-06-22 DIAGNOSIS — B0229 Other postherpetic nervous system involvement: Secondary | ICD-10-CM | POA: Diagnosis not present

## 2019-06-24 ENCOUNTER — Other Ambulatory Visit: Payer: Self-pay | Admitting: Neurology

## 2019-07-10 ENCOUNTER — Ambulatory Visit (HOSPITAL_COMMUNITY)
Admission: EM | Admit: 2019-07-10 | Discharge: 2019-07-10 | Disposition: A | Discharge status: Home / Self Care | Payer: Medicare Other | Attending: Family Medicine | Admitting: Family Medicine

## 2019-07-10 ENCOUNTER — Encounter (HOSPITAL_COMMUNITY): Payer: Self-pay

## 2019-07-10 ENCOUNTER — Ambulatory Visit (INDEPENDENT_AMBULATORY_CARE_PROVIDER_SITE_OTHER): Payer: Medicare Other

## 2019-07-10 ENCOUNTER — Other Ambulatory Visit: Payer: Self-pay

## 2019-07-10 DIAGNOSIS — R1032 Left lower quadrant pain: Secondary | ICD-10-CM

## 2019-07-10 DIAGNOSIS — R109 Unspecified abdominal pain: Secondary | ICD-10-CM

## 2019-07-10 LAB — POCT URINALYSIS DIP (DEVICE)
Bilirubin Urine: NEGATIVE
Glucose, UA: NEGATIVE mg/dL
Leukocytes,Ua: NEGATIVE
Nitrite: NEGATIVE
Protein, ur: NEGATIVE mg/dL
Specific Gravity, Urine: 1.02 (ref 1.005–1.030)
Urobilinogen, UA: 0.2 mg/dL (ref 0.0–1.0)
pH: 5.5 (ref 5.0–8.0)

## 2019-07-10 MED ORDER — FAMOTIDINE 20 MG PO TABS: 20.0000 mg | ORAL_TABLET | Freq: Two times a day (BID) | ORAL | 0 refills | Status: DC | PRN

## 2019-07-10 NOTE — ED Provider Notes (Signed)
[facility]    CSN: [id number] Arrival date & time: 07/10/19  1141      History   Chief Complaint Chief Complaint  Patient presents with  . Abdominal Pain    HPI Amy [name] is a 74 y.o. female.   HPI  Medical hx significant for Parkinson, palpitations,chronic abdominal pain, and cholecystotomy. Patient presents today accompanied by spouse  with a complaint of LLQ x 2 hours following breakfast.  Patient complains that pain actually involved upper and lower quadrants of the abdomen which has improved since arriving to urgent care. She had a small loose stool today and with nausea. She endorses experiencing a large amount of gas after eating. She and spouse were concern patient could have diverticulitis. She is afebrile,non-toxic appearing, and denies any current abdominal pain. Patient and spouse are very limited historians required frequent probing ans redirection to obtain HPI.  Past Medical History:  Diagnosis Date  . Dyspnea on exertion   . Elevated blood sugar   . Hair loss   . Hashimoto's thyroiditis   . Hypothyroidism   . Impaired fasting blood sugar   . Osteoporosis   . Palpitations   . Parkinson's disease ([place])   . Parkinsonism ([place])   . Restless leg syndrome 06/21/2019  . Ventricular premature beats   . Weakness     Patient Active Problem List   Diagnosis Date Noted  . Restless leg syndrome 06/21/2019  . Memory loss 01/29/2018  . Parkinson's disease ([place]) 01/27/2017    Past Surgical History:  Procedure Laterality Date  . CATARACT EXTRACTION Bilateral   . CESAREAN SECTION     x2  . CHOLECYSTECTOMY    . FOOT SURGERY Right 01/15/2016  . WRIST SURGERY  1999    OB History   No obstetric history on file.      Home Medications    Prior to Admission medications   Medication Sig Start Date End Date Taking? Authorizing Provider  acetaminophen (TYLENOL) 500 MG tablet Take 2 tablets (1,000 mg total) by mouth every 6 (six) hours as  needed. Patient taking differently: Take 1,000 mg by mouth every 6 (six) hours as needed for mild pain or headache.  02/26/17   Charlesetta Shanks, MD  alendronate (FOSAMAX) 70 MG tablet Take 70 mg by mouth once a week. Sunday 01/05/18   [provider]  amantadine (SYMMETREL) 50 MG/5ML solution Take 5 mLs (50 mg total) by mouth in the morning, at noon, and at bedtime. 06/21/19   Kathrynn Ducking, MD  aspirin EC 81 MG tablet Take 1 tablet (81 mg total) by mouth daily. 08/10/18   Fay Records, MD  baclofen (LIORESAL) 10 MG tablet Take 0.5 tablets (5 mg total) by mouth 2 (two) times daily as needed for muscle spasms. 05/04/19   Suzzanne Cloud, NP  carbidopa-levodopa (SINEMET) 25-100 MG tablet Take 1.5 tablets every three hours 03/01/19   Kathrynn Ducking, MD  Carbidopa-Levodopa ER (SINEMET CR) 25-100 MG tablet controlled release TAKE 1 TABLET AT BEDTIME 05/18/19   Kathrynn Ducking, MD  Carboxymethylcellul-Glycerin (CLEAR EYES FOR DRY EYES) 1-0.25 % SOLN Place 1 drop into both eyes daily as needed (dry eyes).    [provider]  escitalopram (LEXAPRO) 10 MG tablet TAKE 1 TABLET BY MOUTH EVERY DAY 06/24/19   Kathrynn Ducking, MD  levothyroxine (SYNTHROID, LEVOTHROID) 50 MCG tablet Take 50 mcg daily before breakfast by mouth.    [provider]  Opicapone (ONGENTYS) 50 MG CAPS  Take 50 mg by mouth daily. 06/21/19   Kathrynn Ducking, MD  pantoprazole (PROTONIX) 40 MG tablet Take 1 tablet (40 mg total) by mouth daily. 08/10/18   Fay Records, MD  pramipexole (MIRAPEX) 1 MG tablet Take 1 tablet (1 mg total) by mouth 3 (three) times daily. 08/26/18   Kathrynn Ducking, MD  QUEtiapine (SEROQUEL) 25 MG tablet TAKE 2 TABLETS (50 MG TOTAL) BY MOUTH AT BEDTIME. 10/16/18   [provider]  SYNTHROID 75 MCG tablet  11/16/18   [provider]    Family History Family History  Problem Relation Age of Onset  . Breast cancer Mother   . Lung disease Brother     Social  History Social History   Tobacco Use  . Smoking status: Never Smoker  . Smokeless tobacco: Never Used  Substance Use Topics  . Alcohol use: No  . Drug use: No     Allergies   Ciprofloxacin   Review of Systems Review of Systems Pertinent negatives listed in HPI Physical Exam Triage Vital Signs ED Triage Vitals  Enc Vitals Group     BP 07/10/19 1215 112/68     Pulse Rate 07/10/19 1215 85     Resp 07/10/19 1215 12     Temp 07/10/19 1215 97.8 F (36.6 C)     Temp Source 07/10/19 1215 Oral     SpO2 07/10/19 1215 97 %     Weight --      Height --      Head Circumference --      Peak Flow --      Pain Score 07/10/19 1214 4     Pain Loc --      Pain Edu? --      Excl. in [place]? --    No data found.  Updated Vital Signs BP 112/68 (BP Location: Right Arm)   Pulse 85   Temp 97.8 F (36.6 C) (Oral)   Resp 12   SpO2 97%   Visual Acuity Right Eye Distance:   Left Eye Distance:   Bilateral Distance:    Right Eye Near:   Left Eye Near:    Bilateral Near:     Physical Exam General appearance: alert, cooperative, -acute distress Head: Normocephalic, without obvious abnormality, atraumatic Respiratory: Respirations even and unlabored, normal respiratory rate Heart: rate and rhythm normal. No gallop or murmurs noted on exam  Abdomen: BS +, soft, -distention, -tenderness, -mass, -guarding Extremities: No gross deformities Skin: Skin color, texture, turgor normal. No rashes seen  Psych: Appropriate mood and affect. Neurologic:Alert, oriented to person, place, and time, thought content appropriate.  UC Treatments / Results  Labs (all labs ordered are listed, but only abnormal results are displayed) Labs Reviewed  POCT URINALYSIS DIP (DEVICE) - Abnormal; Notable for the following components:      Result Value   Ketones, ur TRACE (*)    Hgb urine dipstick TRACE (*)    All other components within normal limits    EKG   Radiology No results  found.  Procedures Procedures (including critical care time)  Medications Ordered in UC Medications - No data to display  Initial Impression / Assessment and Plan / UC Course  I have reviewed the triage vital signs and the nursing notes.  Pertinent labs & imaging results that were available during my care of the patient were reviewed by me and considered in my medical decision making (see chart for details).    Abdominal pain, unspecified,  no concern for acute abdomen. Symptoms resolved while here at urgent care. UA inconsistent acute cystis. Abdomen plain films reveal moderate stool and nonobstructive bowel gas. Recommended hydrating with water (6-eight ounce glasses per day), Increase fiber in diet, and activity as tolerated. Pepcid 20 mg BID PRN for abdominal indigestion as needed  Red flags discussed and strict return precautions discussed. Final Clinical Impressions(s) / UC Diagnoses   Final diagnoses:  Abdominal pain, left lower quadrant     Discharge Instructions     Increase water intake 6 glasses of water per day. I have prescribed you Famotidine 20 mg to take for abdominal discomfort as needed. Urine culture is also pending, if abnormal, we will contact you.      ED Prescriptions    Medication Sig Dispense Auth. Provider   famotidine (PEPCID) 20 MG tablet Take 1 tablet (20 mg total) by mouth 2 (two) times daily as needed for heartburn or indigestion. 30 tablet Scot Jun, FNP     PDMP not reviewed this encounter.   Scot Jun, FNP 07/12/19 1359

## 2019-07-10 NOTE — Discharge Instructions (Addendum)
Increase water intake 6 glasses of water per day. I have prescribed you Famotidine 20 mg to take for abdominal discomfort as needed. Urine culture is also pending, if abnormal, we will contact you.

## 2019-07-10 NOTE — ED Triage Notes (Signed)
C/o LLQ pain. Reports hx of diverticulitis and states this feels like that.

## 2019-07-23 ENCOUNTER — Other Ambulatory Visit: Payer: Self-pay | Admitting: Family Medicine

## 2019-07-28 ENCOUNTER — Other Ambulatory Visit: Payer: Self-pay | Admitting: Neurology

## 2019-07-29 ENCOUNTER — Other Ambulatory Visit: Payer: Self-pay | Admitting: Neurology

## 2019-08-01 ENCOUNTER — Other Ambulatory Visit: Payer: Self-pay | Admitting: Family Medicine

## 2019-08-15 ENCOUNTER — Other Ambulatory Visit: Payer: Self-pay

## 2019-08-15 ENCOUNTER — Ambulatory Visit (HOSPITAL_COMMUNITY)
Admission: EM | Admit: 2019-08-15 | Discharge: 2019-08-15 | Disposition: A | Discharge status: Home / Self Care | Payer: Medicare Other | Attending: Urgent Care | Admitting: Urgent Care

## 2019-08-15 ENCOUNTER — Encounter (HOSPITAL_COMMUNITY): Payer: Self-pay

## 2019-08-15 DIAGNOSIS — K5900 Constipation, unspecified: Secondary | ICD-10-CM | POA: Insufficient documentation

## 2019-08-15 DIAGNOSIS — Z8719 Personal history of other diseases of the digestive system: Secondary | ICD-10-CM | POA: Insufficient documentation

## 2019-08-15 DIAGNOSIS — R1084 Generalized abdominal pain: Secondary | ICD-10-CM

## 2019-08-15 DIAGNOSIS — R1032 Left lower quadrant pain: Secondary | ICD-10-CM | POA: Diagnosis not present

## 2019-08-15 LAB — CBC WITH DIFFERENTIAL/PLATELET
Abs Immature Granulocytes: 0.02 10*3/uL (ref 0.00–0.07)
Basophils Absolute: 0 10*3/uL (ref 0.0–0.1)
Basophils Relative: 0 %
Eosinophils Absolute: 0.1 10*3/uL (ref 0.0–0.5)
Eosinophils Relative: 1 %
HCT: 44.5 % (ref 36.0–46.0)
Hemoglobin: 14.3 g/dL (ref 12.0–15.0)
Immature Granulocytes: 0 %
Lymphocytes Relative: 20 %
Lymphs Abs: 1.7 10*3/uL (ref 0.7–4.0)
MCH: 29 pg (ref 26.0–34.0)
MCHC: 32.1 g/dL (ref 30.0–36.0)
MCV: 90.3 fL (ref 80.0–100.0)
Monocytes Absolute: 0.8 10*3/uL (ref 0.1–1.0)
Monocytes Relative: 9 %
Neutro Abs: 5.9 10*3/uL (ref 1.7–7.7)
Neutrophils Relative %: 70 %
Platelets: 272 10*3/uL (ref 150–400)
RBC: 4.93 MIL/uL (ref 3.87–5.11)
RDW: 13.5 % (ref 11.5–15.5)
WBC: 8.6 10*3/uL (ref 4.0–10.5)
nRBC: 0 % (ref 0.0–0.2)

## 2019-08-15 NOTE — Discharge Instructions (Signed)
I am concerned that you may be having recurrent diverticulitis.  Unfortunately at the moment I do not have a reason to have you go to the emergency room for an emergency CT scan.  We are getting your white blood cell count through blood work today.  If this is very high then I will call you and let you know that you have to go to the emergency room today.  Otherwise I would like for you to call your PCP tomorrow morning to request an outpatient CT scan of the abdomen/pelvis to rule out diverticulitis. Please just use Tylenol at a dose of 500mg -650mg  once every 6 hours as needed for your aches, pains, fevers. Do not use any nonsteroidal anti-inflammatories (NSAIDs) like ibuprofen, Motrin, naproxen, Aleve, etc. which are all available over-the-counter.

## 2019-08-15 NOTE — ED Provider Notes (Signed)
[facility]   MRN: [id number] DOB: [birth year]  Subjective:   Jovonne Wilton is a 74 y.o. female presenting for 3-week history of persistent and mildly worsening lower abdominal pain.  Patient states that the pain alternates between the right lower quadrant left lower quadrant.  However when she is hurting the left lower quadrant hurts worse.  She has had a history of diverticulitis but is remote.  She also has had a history of intermittent bouts of constipation.  Last bowel movement was yesterday.  Denies fever, nausea, vomiting, bloody stools, diarrhea.  Patient has not used many medications for pain.  She does have a PCP that she could follow-up with closely.  No current facility-administered medications for this encounter.  Current Outpatient Medications:    acetaminophen (TYLENOL) 500 MG tablet, Take 2 tablets (1,000 mg total) by mouth every 6 (six) hours as needed. (Patient taking differently: Take 1,000 mg by mouth every 6 (six) hours as needed for mild pain or headache. ), Disp: 30 tablet, Rfl: 0   alendronate (FOSAMAX) 70 MG tablet, Take 70 mg by mouth once a week. Sunday, Disp: , Rfl: 12   amantadine (SYMMETREL) 50 MG/5ML solution, Take 5 mLs (50 mg total) by mouth in the morning, at noon, and at bedtime., Disp: 450 mL, Rfl: 5   aspirin EC 81 MG tablet, Take 1 tablet (81 mg total) by mouth daily., Disp: 90 tablet, Rfl: 3   baclofen (LIORESAL) 10 MG tablet, Take 0.5 tablets (5 mg total) by mouth 2 (two) times daily as needed for muscle spasms., Disp: 30 each, Rfl: 2   carbidopa-levodopa (SINEMET) 25-100 MG tablet, Take 1.5 tablets every three hours, Disp: 1080 tablet, Rfl: 1   Carbidopa-Levodopa ER (SINEMET CR) 25-100 MG tablet controlled release, TAKE 1 TABLET AT BEDTIME, Disp: 90 tablet, Rfl: 3   Carboxymethylcellul-Glycerin (CLEAR EYES FOR DRY EYES) 1-0.25 % SOLN, Place 1 drop into both eyes daily as needed (dry eyes)., Disp: , Rfl:    escitalopram (LEXAPRO) 10 MG  tablet, TAKE 1 TABLET BY MOUTH EVERY DAY, Disp: 90 tablet, Rfl: 1   famotidine (PEPCID) 20 MG tablet, Take 1 tablet (20 mg total) by mouth 2 (two) times daily as needed for heartburn or indigestion., Disp: 30 tablet, Rfl: 0   levothyroxine (SYNTHROID, LEVOTHROID) 50 MCG tablet, Take 50 mcg daily before breakfast by mouth., Disp: , Rfl:    Opicapone (ONGENTYS) 50 MG CAPS, Take 50 mg by mouth daily., Disp: 30 capsule, Rfl: 4   pantoprazole (PROTONIX) 40 MG tablet, Take 1 tablet (40 mg total) by mouth daily., Disp: 30 tablet, Rfl: 11   pramipexole (MIRAPEX) 1 MG tablet, Take 1 tablet (1 mg total) by mouth 3 (three) times daily., Disp: 270 tablet, Rfl: 3   QUEtiapine (SEROQUEL) 25 MG tablet, TAKE 2 TABLETS (50 MG TOTAL) BY MOUTH AT BEDTIME., Disp: , Rfl:    SYNTHROID 75 MCG tablet, , Disp: , Rfl:    Allergies  Allergen Reactions   Ciprofloxacin Rash    Past Medical History:  Diagnosis Date   Dyspnea on exertion    Elevated blood sugar    Hair loss    Hashimoto's thyroiditis    Hypothyroidism    Impaired fasting blood sugar    Osteoporosis    Palpitations    Parkinson's disease ([facility])    Parkinsonism ([facility])    Restless leg syndrome 06/21/2019   Ventricular premature beats    Weakness      Past Surgical History:  Procedure  Laterality Date   CATARACT EXTRACTION Bilateral    CESAREAN SECTION     x2   CHOLECYSTECTOMY     FOOT SURGERY Right 01/15/2016   WRIST SURGERY  1999    Family History  Problem Relation Age of Onset   Breast cancer Mother    Lung disease Brother     Social History   Tobacco Use   Smoking status: Never Smoker   Smokeless tobacco: Never Used  Substance Use Topics   Alcohol use: No   Drug use: No    ROS   Objective:   Vitals: BP 94/63 (BP Location: Left Arm)    Pulse 67    Temp 98.4 F (36.9 C) (Oral)    Resp 18    SpO2 96%   Physical Exam Constitutional:      General: She is not in acute distress.     Appearance: Normal appearance. She is well-developed and normal weight. She is not ill-appearing, toxic-appearing or diaphoretic.  HENT:     Head: Normocephalic and atraumatic.     Right Ear: External ear normal.     Left Ear: External ear normal.     Nose: Nose normal.     Mouth/Throat:     Mouth: Mucous membranes are moist.     Pharynx: Oropharynx is clear.  Eyes:     General: No scleral icterus.    Extraocular Movements: Extraocular movements intact.     Pupils: Pupils are equal, round, and reactive to light.  Cardiovascular:     Rate and Rhythm: Normal rate and regular rhythm.     Heart sounds: Normal heart sounds. No murmur. No friction rub. No gallop.   Pulmonary:     Effort: Pulmonary effort is normal. No respiratory distress.     Breath sounds: Normal breath sounds. No stridor. No wheezing, rhonchi or rales.  Abdominal:     General: Bowel sounds are normal. There is no distension.     Palpations: Abdomen is soft. There is no mass.     Tenderness: There is abdominal tenderness (mild over LLQ) in the left lower quadrant. There is no right CVA tenderness, left CVA tenderness, guarding or rebound.  Skin:    General: Skin is warm and dry.     Coloration: Skin is not pale.     Findings: No rash.  Neurological:     General: No focal deficit present.     Mental Status: She is alert and oriented to person, place, and time.  Psychiatric:        Mood and Affect: Mood normal.        Behavior: Behavior normal.        Thought Content: Thought content normal.        Judgment: Judgment normal.     Assessment and Plan :   PDMP not reviewed this encounter.  1. Generalized abdominal pain   2. Left lower quadrant abdominal pain   3. Constipation, unspecified constipation type   4. History of diverticulitis     Patient physical exam findings demonstrate very mild left lower quadrant tenderness only.  Vital signs are very stable.  Discussed with patient and her husband that the most  worrisome differential diagnosis is diverticulitis but given her stable vital signs and physical exam findings patient prefers to not go to the emergency room now.  I was agreeable, will draw CBC and if the white blood cell count is severely elevated will redirect to the emergency room today.  Otherwise will  recommend follow-up with their PCP, request for outpatient CT scan.  Use Tylenol for any pains as needed. Counseled patient on potential for adverse effects with medications prescribed/recommended today, ER and return-to-clinic precautions discussed, patient verbalized understanding.    Jaynee Eagles, PA-C 08/15/19 1105

## 2019-08-15 NOTE — ED Triage Notes (Signed)
Pt reports abdominal pain intermittent x 2-3 weeks, worse in the morning when she wakes up. Pt denies diarrhea, fever, nausea, vomiting. Famotidine give somewhat relief.

## 2019-08-19 DIAGNOSIS — K5903 Drug induced constipation: Secondary | ICD-10-CM | POA: Diagnosis not present

## 2019-08-19 DIAGNOSIS — R1032 Left lower quadrant pain: Secondary | ICD-10-CM | POA: Diagnosis not present

## 2019-09-07 ENCOUNTER — Other Ambulatory Visit: Payer: Self-pay | Admitting: Neurology

## 2019-10-21 ENCOUNTER — Other Ambulatory Visit: Payer: Self-pay | Admitting: Neurology

## 2019-10-26 ENCOUNTER — Encounter: Payer: Self-pay | Admitting: Neurology

## 2019-10-26 ENCOUNTER — Ambulatory Visit (INDEPENDENT_AMBULATORY_CARE_PROVIDER_SITE_OTHER): Payer: Medicare Other | Admitting: Neurology

## 2019-10-26 VITALS — BP 104/62 | HR 85 | Ht 62.0 in | Wt 132.0 lb

## 2019-10-26 DIAGNOSIS — G20 Parkinson's disease: Secondary | ICD-10-CM | POA: Diagnosis not present

## 2019-10-26 MED ORDER — AMANTADINE HCL 100 MG PO CAPS: 100.0000 mg | ORAL_CAPSULE | Freq: Three times a day (TID) | ORAL | 3 refills | Status: DC

## 2019-10-26 MED ORDER — CARBIDOPA-LEVODOPA ER 50-200 MG PO TBCR: 1.0000 | EXTENDED_RELEASE_TABLET | ORAL | 1 refills | Status: DC

## 2019-10-26 MED ORDER — CARBIDOPA-LEVODOPA 25-100 MG PO TABS: ORAL_TABLET | ORAL | 1 refills | Status: DC

## 2019-10-26 NOTE — Progress Notes (Signed)
Reason for visit: Parkinson's disease  Amy [name] is an 74 y.o. female  History of present illness:  [name] is a 74 year old right-handed white female with a history of Parkinson's disease.  The patient has been on relatively high-dose Sinemet, she gets the Sinemet 25/100 mg tablets taking 1.5 tablets every 3 hours.  In reality, her husband apparently varies the dosing, some days she will get 2 tablets every 2 hours, so the total number of tablets daily varies from 1 day to the next.  The patient is having significant problems with dyskinesias that have worsened over time.  She is on Symmetrel taking 50 mg 3 times daily.  The patient continues to have ongoing hallucinations throughout the day.  The patient has not had any falls.  She is still freezing some.  The patient sleeps fairly well at night.  She is having some troubles with choking, primarily on solids, not liquids.  She returns to the office today for an evaluation.  The patient does have some underlying memory troubles.  They have not been able to afford the Comtan, and could not get Ongentys for the same reason.   Past Medical History:  Diagnosis Date  . Dyspnea on exertion   . Elevated blood sugar   . Hair loss   . Hashimoto's thyroiditis   . Hypothyroidism   . Impaired fasting blood sugar   . Osteoporosis   . Palpitations   . Parkinson's disease ([place])   . Parkinsonism ([place])   . Restless leg syndrome 06/21/2019  . Ventricular premature beats   . Weakness     Past Surgical History:  Procedure Laterality Date  . CATARACT EXTRACTION Bilateral   . CESAREAN SECTION     x2  . CHOLECYSTECTOMY    . FOOT SURGERY Right 01/15/2016  . WRIST SURGERY  1999    Family History  Problem Relation Age of Onset  . Breast cancer Mother   . Lung disease Brother     Social history:  reports that she has never smoked. She has never used smokeless tobacco. She reports that she does not drink alcohol and does not use drugs.      Allergies  Allergen Reactions  . Ciprofloxacin Rash    Medications:  Prior to Admission medications   Medication Sig Start Date End Date Taking? Authorizing Provider  acetaminophen (TYLENOL) 500 MG tablet Take 2 tablets (1,000 mg total) by mouth every 6 (six) hours as needed. Patient taking differently: Take 1,000 mg by mouth every 6 (six) hours as needed for mild pain or headache.  02/26/17  Yes Pfeiffer, Jeannie Done, MD  alendronate (FOSAMAX) 70 MG tablet Take 70 mg by mouth once a week. Sunday 01/05/18  Yes [provider]  aspirin EC 81 MG tablet Take 1 tablet (81 mg total) by mouth daily. 08/10/18  Yes Fay Records, MD  baclofen (LIORESAL) 10 MG tablet TAKE 1/2 TABLET BY MOUTH AT BEDTIME 10/21/19  Yes Suzzanne Cloud, NP  carbidopa-levodopa (SINEMET IR) 25-100 MG tablet One tablet in the morning 10/26/19  Yes Kathrynn Ducking, MD  Carboxymethylcellul-Glycerin (CLEAR EYES FOR DRY EYES) 1-0.25 % SOLN Place 1 drop into both eyes daily as needed (dry eyes).   Yes [provider]  escitalopram (LEXAPRO) 10 MG tablet TAKE 1 TABLET BY MOUTH EVERY DAY 06/24/19  Yes Kathrynn Ducking, MD  famotidine (PEPCID) 20 MG tablet Take 1 tablet (20 mg total) by mouth 2 (two) times daily as needed for  heartburn or indigestion. 07/10/19  Yes Scot Jun, FNP  levothyroxine (SYNTHROID, LEVOTHROID) 50 MCG tablet Take 50 mcg daily before breakfast by mouth.   Yes [provider]  pantoprazole (PROTONIX) 40 MG tablet Take 1 tablet (40 mg total) by mouth daily. 08/10/18  Yes Fay Records, MD  pramipexole (MIRAPEX) 1 MG tablet Take 1 tablet (1 mg total) by mouth 3 (three) times daily. 08/26/18  Yes Kathrynn Ducking, MD  QUEtiapine (SEROQUEL) 25 MG tablet TAKE 2 TABLETS (50 MG TOTAL) BY MOUTH AT BEDTIME. 10/16/18  Yes [provider]  SYNTHROID 75 MCG tablet  11/16/18  Yes [provider]  amantadine (SYMMETREL) 100 MG capsule Take 1 capsule (100 mg total) by mouth in the morning,  at noon, and at bedtime. 10/26/19   Kathrynn Ducking, MD  carbidopa-levodopa (SINEMET CR) 50-200 MG tablet Take 1 tablet by mouth every 4 (four) hours. 10/26/19   Kathrynn Ducking, MD    ROS:  Out of a complete 14 system review of symptoms, the patient complains only of the following symptoms, and all other reviewed systems are negative.  Walking difficulty Memory problems Tremors  Blood pressure 104/62, pulse 85, height 5\' 2"  (1.575 m), weight 132 lb (59.9 kg).  Physical Exam  General: The patient is alert and cooperative at the time of the examination.  The patient has prominent dyskinesias involving the body and arms and legs.  Skin: No significant peripheral edema is noted.   Neurologic Exam  Mental status: The patient is alert and oriented x 3 at the time of the examination. The patient has apparent normal recent and remote memory, with an apparently normal attention span and concentration ability.   Cranial nerves: Facial symmetry is present. Speech is normal, no aphasia or dysarthria is noted. Extraocular movements are full. Visual fields are full.  Masking the face is seen.  Motor: The patient has good strength in all 4 extremities.  Sensory examination: Soft touch sensation is symmetric on the face, arms, and legs.  Coordination: The patient has good finger-nose-finger and heel-to-shin bilaterally.  Gait and station: The patient has some difficulty arising from a seated position with arms crossed.  Once up, she can walk independently, she has prominent dyskinesias at all times.  She has some mild hesitancy with initiation of ambulation.  She has some instability with tandem gait.  Romberg is negative.  Reflexes: Deep tendon reflexes are symmetric.   Assessment/Plan:  1.  Parkinson's disease  2.  Mild memory disturbance  3.  Hallucinations  The patient is on a very large dose of Sinemet, the dyskinesias are quite prominent.  The husband will vary the dosing from  1 day to the next, it is not clear exactly how many Sinemet tablets she is getting in on a regular basis.  The patient will be cut back on the Sinemet dose and converted to the long-acting preparation as she cannot get Comtan.  She will take Sinemet CR 50/200 mg tablets, 1 tablet every 4 hours.  She will take a single Sinemet 25/100 mg IR tablet in the morning with her first dose.  The Symmetrel will be increased to 100 mg 3 times daily.  She will follow-up here in 4 months.  I will call for any dose adjustments.  The patient remains on Seroquel for the hallucinations.  She is on Mirapex 1 mg 3 times daily.  Jill Alexanders MD 10/26/2019 11:26 AM  [facility] [address]  [place], [place] [address]  Phone [phone] Fax [phone]

## 2019-10-26 NOTE — Patient Instructions (Signed)
We will go to Sinemet CR 50/200 mg tablets, one tablet every 4 hours while awake, take one of the Sinemet IR 25/100 mg tablets with the CR in the morning. We will increase the symmetrel to 100 mg three times a day.

## 2019-10-27 ENCOUNTER — Other Ambulatory Visit: Payer: Self-pay | Admitting: Neurology

## 2019-12-19 ENCOUNTER — Other Ambulatory Visit: Payer: Self-pay | Admitting: Neurology

## 2020-01-06 DIAGNOSIS — M810 Age-related osteoporosis without current pathological fracture: Secondary | ICD-10-CM | POA: Diagnosis not present

## 2020-01-06 DIAGNOSIS — E039 Hypothyroidism, unspecified: Secondary | ICD-10-CM | POA: Diagnosis not present

## 2020-01-06 DIAGNOSIS — E785 Hyperlipidemia, unspecified: Secondary | ICD-10-CM | POA: Diagnosis not present

## 2020-01-06 DIAGNOSIS — Z0000 Encounter for general adult medical examination without abnormal findings: Secondary | ICD-10-CM | POA: Diagnosis not present

## 2020-01-06 DIAGNOSIS — G2581 Restless legs syndrome: Secondary | ICD-10-CM | POA: Diagnosis not present

## 2020-01-06 DIAGNOSIS — Z23 Encounter for immunization: Secondary | ICD-10-CM | POA: Diagnosis not present

## 2020-01-06 DIAGNOSIS — F411 Generalized anxiety disorder: Secondary | ICD-10-CM | POA: Diagnosis not present

## 2020-01-06 DIAGNOSIS — Z1239 Encounter for other screening for malignant neoplasm of breast: Secondary | ICD-10-CM | POA: Diagnosis not present

## 2020-01-06 DIAGNOSIS — B0229 Other postherpetic nervous system involvement: Secondary | ICD-10-CM | POA: Diagnosis not present

## 2020-01-06 DIAGNOSIS — R7309 Other abnormal glucose: Secondary | ICD-10-CM | POA: Diagnosis not present

## 2020-01-06 DIAGNOSIS — G20 Parkinson's disease: Secondary | ICD-10-CM | POA: Diagnosis not present

## 2020-01-18 ENCOUNTER — Other Ambulatory Visit: Payer: Self-pay | Admitting: Internal Medicine

## 2020-01-18 ENCOUNTER — Other Ambulatory Visit: Payer: Self-pay | Admitting: Neurology

## 2020-01-18 DIAGNOSIS — Z1231 Encounter for screening mammogram for malignant neoplasm of breast: Secondary | ICD-10-CM

## 2020-01-24 ENCOUNTER — Other Ambulatory Visit: Payer: Self-pay | Admitting: Neurology

## 2020-02-07 ENCOUNTER — Other Ambulatory Visit: Payer: Self-pay | Admitting: Neurology

## 2020-03-06 DIAGNOSIS — Z23 Encounter for immunization: Secondary | ICD-10-CM | POA: Diagnosis not present

## 2020-03-09 ENCOUNTER — Other Ambulatory Visit: Payer: Self-pay | Admitting: Neurology

## 2020-03-16 ENCOUNTER — Encounter: Payer: Self-pay | Admitting: Neurology

## 2020-03-16 ENCOUNTER — Ambulatory Visit (INDEPENDENT_AMBULATORY_CARE_PROVIDER_SITE_OTHER): Payer: Medicare Other | Admitting: Neurology

## 2020-03-16 VITALS — BP 120/68 | HR 73 | Ht 62.0 in | Wt 133.6 lb

## 2020-03-16 DIAGNOSIS — R413 Other amnesia: Secondary | ICD-10-CM | POA: Diagnosis not present

## 2020-03-16 DIAGNOSIS — G2581 Restless legs syndrome: Secondary | ICD-10-CM

## 2020-03-16 DIAGNOSIS — G20 Parkinson's disease: Secondary | ICD-10-CM

## 2020-03-16 MED ORDER — CARBIDOPA-LEVODOPA ER 50-200 MG PO TBCR
1.0000 | EXTENDED_RELEASE_TABLET | ORAL | 1 refills | Status: DC
Start: 2020-03-16 — End: 2020-06-13

## 2020-03-16 MED ORDER — BACLOFEN 10 MG PO TABS: 5.0000 mg | ORAL_TABLET | Freq: Every day | ORAL | 3 refills | Status: DC

## 2020-03-16 MED ORDER — ESCITALOPRAM OXALATE 20 MG PO TABS: 20.0000 mg | ORAL_TABLET | Freq: Every day | ORAL | 3 refills | Status: DC

## 2020-03-16 NOTE — Progress Notes (Signed)
Reason for visit: Parkinson's disease, memory disorder  Amy [name] is an 75 y.o. female  History of present illness:  Ms. Raffel is a 75 year old right-handed white female with a history of Parkinson's disease.  The patient has been on a relatively high dose of the Sinemet, her husband indicates that if she does not get some medication in every 2 hours she will freeze up and have problems.  She does however have some issues with dyskinesias, she is on amantadine 100 mg dosing 3 times daily.  The patient also has some problems with a panic disorder, anxiety has been worse recently.  The patient may be having at least 1 panic attack daily.  She is having some issues with memory as well.  The patient has not had any falls, she tries to stay active.  She has a lot of word finding problems.  She may have occasional hallucinations but she does not take the Seroquel on a regular basis.  Past Medical History:  Diagnosis Date  . Dyspnea on exertion   . Elevated blood sugar   . Hair loss   . Hashimoto's thyroiditis   . Hypothyroidism   . Impaired fasting blood sugar   . Osteoporosis   . Palpitations   . Parkinson's disease ([facility])   . Parkinsonism ([facility])   . Restless leg syndrome 06/21/2019  . Ventricular premature beats   . Weakness     Past Surgical History:  Procedure Laterality Date  . CATARACT EXTRACTION Bilateral   . CESAREAN SECTION     x2  . CHOLECYSTECTOMY    . FOOT SURGERY Right 01/15/2016  . WRIST SURGERY  1999    Family History  Problem Relation Age of Onset  . Breast cancer Mother   . Lung disease Brother     Social history:  reports that she has never smoked. She has never used smokeless tobacco. She reports that she does not drink alcohol and does not use drugs.    Allergies  Allergen Reactions  . Ciprofloxacin Rash    Medications:  Prior to Admission medications   Medication Sig Start Date End Date Taking? Authorizing Provider  acetaminophen (TYLENOL) 500  MG tablet Take 2 tablets (1,000 mg total) by mouth every 6 (six) hours as needed. Patient taking differently: Take 1,000 mg by mouth every 6 (six) hours as needed for mild pain or headache. 02/26/17  Yes Pfeiffer, Lebron Conners, MD  alendronate (FOSAMAX) 70 MG tablet Take 70 mg by mouth once a week. Sunday 01/05/18  Yes [provider]  amantadine (SYMMETREL) 100 MG capsule TAKE 1 CAPSULE BY MOUTH EVERY MORNING AT NOON & AT BEDTIME 01/24/20  Yes York Spaniel, MD  aspirin EC 81 MG tablet Take 1 tablet (81 mg total) by mouth daily. 08/10/18  Yes Pricilla Riffle, MD  baclofen (LIORESAL) 10 MG tablet TAKE 1/2 TABLET BY MOUTH AT BEDTIME 10/21/19  Yes Glean Salvo, NP  carbidopa-levodopa (SINEMET CR) 50-200 MG tablet Take 1 tablet by mouth every 4 (four) hours. 10/26/19  Yes York Spaniel, MD  carbidopa-levodopa (SINEMET IR) 25-100 MG tablet TAKE 1 tablet every 4 hours 03/09/20  Yes York Spaniel, MD  Carboxymethylcellul-Glycerin 1-0.25 % SOLN Place 1 drop into both eyes daily as needed (dry eyes).   Yes [provider]  escitalopram (LEXAPRO) 10 MG tablet TAKE 1 TABLET BY MOUTH EVERY DAY 12/21/19  Yes York Spaniel, MD  famotidine (PEPCID) 20 MG tablet Take 1 tablet (20 mg  total) by mouth 2 (two) times daily as needed for heartburn or indigestion. 07/10/19  Yes Scot Jun, FNP  levothyroxine (SYNTHROID, LEVOTHROID) 50 MCG tablet Take 50 mcg daily before breakfast by mouth.   Yes [provider]  pantoprazole (PROTONIX) 40 MG tablet Take 1 tablet (40 mg total) by mouth daily. 08/10/18  Yes Fay Records, MD  pramipexole (MIRAPEX) 1 MG tablet TAKE 1 TABLET THREE TIMES A DAY 10/27/19  Yes Kathrynn Ducking, MD  QUEtiapine (SEROQUEL) 25 MG tablet TAKE 2 TABLETS (50 MG TOTAL) BY MOUTH AT BEDTIME. 10/16/18  Yes [provider]  SYNTHROID 75 MCG tablet  11/16/18  Yes [provider]    ROS:  Out of a complete 14 system review of symptoms, the patient complains  only of the following symptoms, and all other reviewed systems are negative.  Memory problems Hallucinations Dyskinesias Anxiety, panic attacks  Blood pressure 120/68, pulse 73, height 5\' 2"  (1.575 m), weight 133 lb 9.6 oz (60.6 kg).  Physical Exam  General: The patient is alert and cooperative at the time of the examination.  Skin: No significant peripheral edema is noted.   Neurologic Exam  Mental status: The patient is alert and oriented x 3 at the time of the examination. The Mini-Mental status examination done today shows a total score 24/30.   Cranial nerves: Facial symmetry is present. Speech is normal, no aphasia or dysarthria is noted. Extraocular movements are full. Visual fields are full.  Motor: The patient has good strength in all 4 extremities.  Sensory examination: Soft touch sensation is symmetric on the face, arms, and legs.  Coordination: The patient has good finger-nose-finger and heel-to-shin bilaterally.  Prominent dyskinesias are seen.  Gait and station: The patient is able to arise from a seated position with arms crossed, once up, she can walk independently.  She does have dyskinesias with walking.  Romberg is negative.  Reflexes: Deep tendon reflexes are symmetric.   Assessment/Plan:  1.  Parkinson's disease  2.  Memory disturbance  3.  Panic disorder  The Lexapro will be increased to 20 mg daily.  The patient will take the Sinemet CR 50/200 mg tablets, 1 tablet every 4 hours while awake and 2 hours later take a 25/100 mg Sinemet IR tablet.  The patient will remain on the amantadine and she will follow up here in 5 months.  A prescription for the Sinemet CR and for the baclofen were sent in.  Jill Alexanders MD 03/16/2020 10:22 AM  [facility] [address] [place], [place] [address]  Phone [phone] Fax [phone]

## 2020-03-16 NOTE — Patient Instructions (Signed)
We will start the Sinemet CR 50/200 mg tablets every 4 hours while awake, take a sinemet IR 25/100 mg 2 hours after the Sinemet CR.   We will go up on the lexapro to 20 mg daily.

## 2020-03-23 IMAGING — CR DG ABDOMEN 2V
2 series · 2 of 2 positions shown · non-contrast
Comparison: None.

CLINICAL DATA: Constipation and diarrhea for 2 weeks.

EXAM:
ABDOMEN - 2 VIEW

[w abdomen upright *]
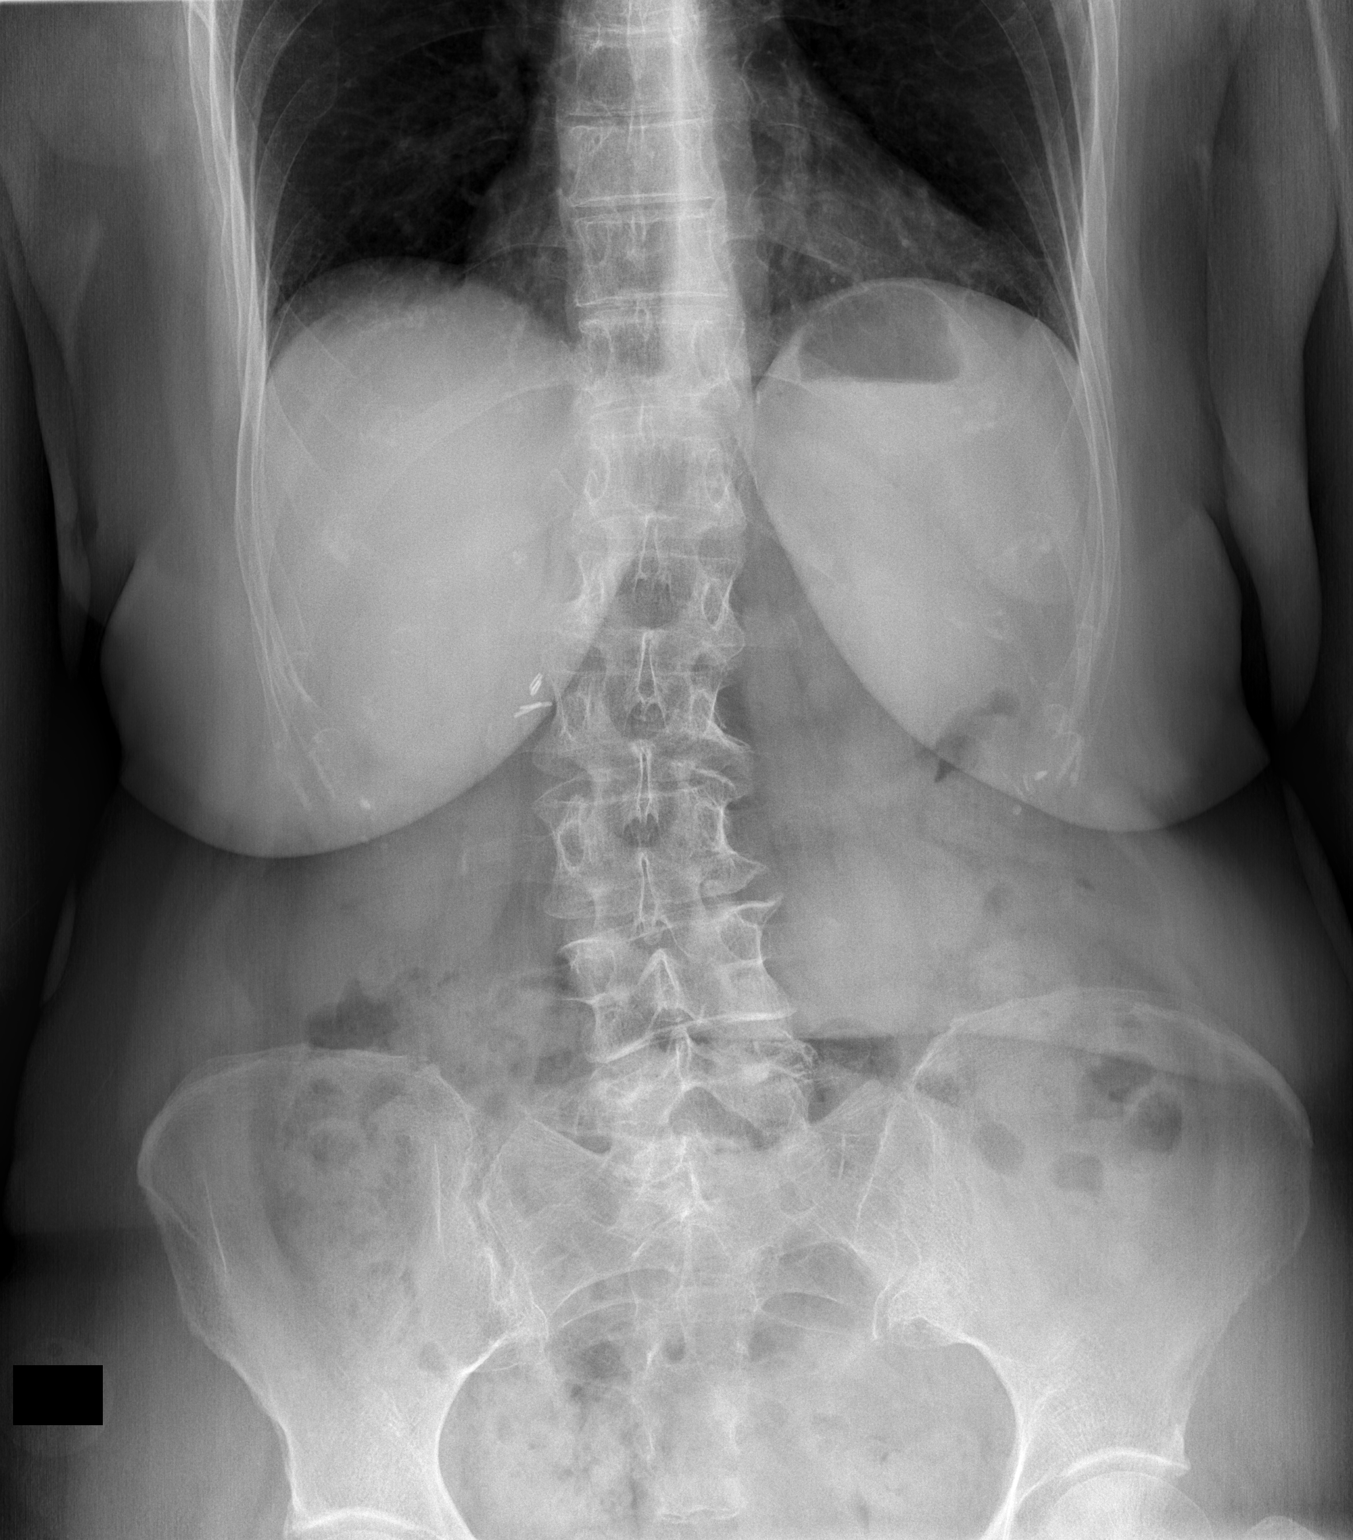

[t abdomen supine]
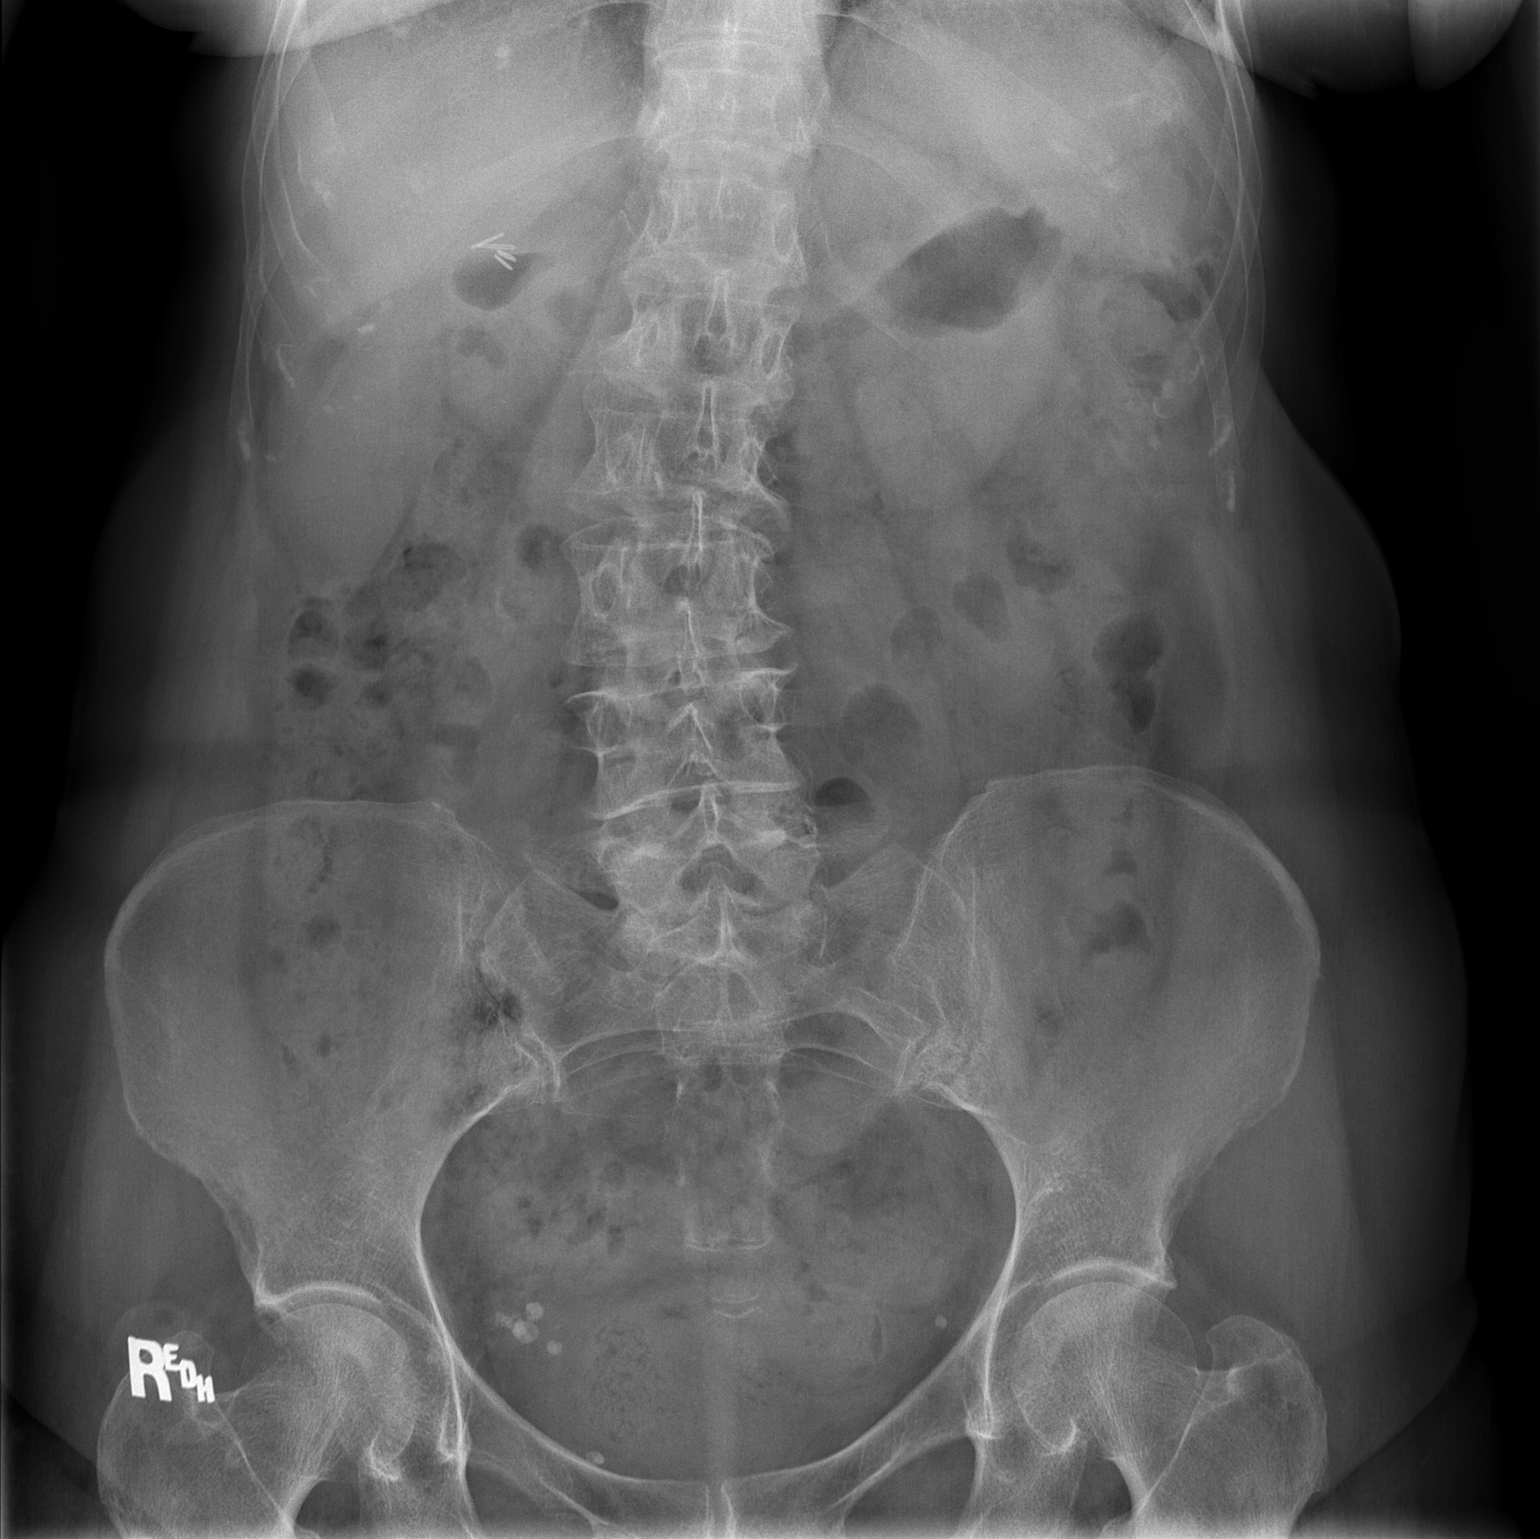

[2 of 2 positions shown; findings below may reference images not displayed]

FINDINGS: Bowel gas pattern is unremarkable.

No dilated bowel loops or pneumoperitoneum.

Cholecystectomy clips and lumbar scoliosis/degenerative changes
noted.

No suspicious calcifications are present.
IMPRESSION: No acute abnormality.

## 2020-04-22 ENCOUNTER — Other Ambulatory Visit: Payer: Self-pay | Admitting: Neurology

## 2020-06-09 ENCOUNTER — Other Ambulatory Visit: Payer: Self-pay | Admitting: Neurology

## 2020-06-16 ENCOUNTER — Other Ambulatory Visit: Payer: Self-pay | Admitting: Neurology

## 2020-07-05 DIAGNOSIS — F411 Generalized anxiety disorder: Secondary | ICD-10-CM | POA: Diagnosis not present

## 2020-07-05 DIAGNOSIS — G20 Parkinson's disease: Secondary | ICD-10-CM | POA: Diagnosis not present

## 2020-07-05 DIAGNOSIS — G2581 Restless legs syndrome: Secondary | ICD-10-CM | POA: Diagnosis not present

## 2020-07-05 DIAGNOSIS — R7303 Prediabetes: Secondary | ICD-10-CM | POA: Diagnosis not present

## 2020-07-05 DIAGNOSIS — E039 Hypothyroidism, unspecified: Secondary | ICD-10-CM | POA: Diagnosis not present

## 2020-08-28 ENCOUNTER — Ambulatory Visit (INDEPENDENT_AMBULATORY_CARE_PROVIDER_SITE_OTHER): Payer: Medicare Other | Admitting: Neurology

## 2020-08-28 ENCOUNTER — Encounter: Payer: Self-pay | Admitting: Neurology

## 2020-08-28 ENCOUNTER — Other Ambulatory Visit: Payer: Self-pay

## 2020-08-28 ENCOUNTER — Telehealth: Payer: Self-pay

## 2020-08-28 VITALS — BP 102/56 | HR 81 | Ht 62.0 in | Wt 126.0 lb

## 2020-08-28 DIAGNOSIS — G20 Parkinson's disease: Secondary | ICD-10-CM | POA: Diagnosis not present

## 2020-08-28 MED ORDER — AMANTADINE HCL 100 MG PO CAPS: ORAL_CAPSULE | ORAL | 1 refills | Status: DC

## 2020-08-28 MED ORDER — BACLOFEN 10 MG PO TABS: 10.0000 mg | ORAL_TABLET | Freq: Every day | ORAL | 3 refills | Status: DC

## 2020-08-28 NOTE — Progress Notes (Signed)
Reason for visit: Parkinson's disease  Amy [name] is an 75 y.o. female  History of present illness:  Amy [name] is a 75 year old right-handed white female with a history of Parkinson's disease and an associated memory disorder.  The patient is having some issues with gait instability, she may fall on occasion.  She reports a lot of problems with word finding that is becoming an increasing problem for her.  She is sleeping fairly well at night.  She may have some problems with leg cramps during the day and during the nighttime, this may wake her up at night.  She is getting Sinemet CR 50/200 mg tablets every 4 hours while awake and may take a 25/100 mg tablet between these doses.  The patient has a lot of dyskinesias that apparently worsen in the afternoon.  The patient takes baclofen 5 mg at night for leg cramps.  She returns to the office today for further evaluation.  Past Medical History:  Diagnosis Date   Dyspnea on exertion    Elevated blood sugar    Hair loss    Hashimoto's thyroiditis    Hypothyroidism    Impaired fasting blood sugar    Osteoporosis    Palpitations    Parkinson's disease ([facility])    Parkinsonism ([facility])    Restless leg syndrome 06/21/2019   Ventricular premature beats    Weakness     Past Surgical History:  Procedure Laterality Date   CATARACT EXTRACTION Bilateral    CESAREAN SECTION     x2   CHOLECYSTECTOMY     FOOT SURGERY Right 01/15/2016   WRIST SURGERY  1999    Family History  Problem Relation Age of Onset   Breast cancer Mother    Lung disease Brother     Social history:  reports that she has never smoked. She has never used smokeless tobacco. She reports that she does not drink alcohol and does not use drugs.    Allergies  Allergen Reactions   Ciprofloxacin Rash    Medications:  Prior to Admission medications   Medication Sig Start Date End Date Taking? Authorizing Provider  acetaminophen (TYLENOL) 500 MG tablet Take 2 tablets (1,000  mg total) by mouth every 6 (six) hours as needed. Patient taking differently: Take 1,000 mg by mouth every 6 (six) hours as needed for mild pain or headache. 02/26/17  Yes Pfeiffer, Jeannie Done, MD  alendronate (FOSAMAX) 70 MG tablet Take 70 mg by mouth once a week. Sunday 01/05/18  Yes [provider]  amantadine (SYMMETREL) 100 MG capsule TAKE 1 CAPSULE BY MOUTH EVERY MORNING AT NOON & AT BEDTIME 01/24/20  Yes Kathrynn Ducking, MD  aspirin EC 81 MG tablet Take 1 tablet (81 mg total) by mouth daily. 08/10/18  Yes Fay Records, MD  baclofen (LIORESAL) 10 MG tablet Take 0.5 tablets (5 mg total) by mouth at bedtime. 03/16/20  Yes Kathrynn Ducking, MD  carbidopa-levodopa (SINEMET CR) 50-200 MG tablet TAKE 1 TABLET BY MOUTH EVERY 4 HOURS. 06/13/20  Yes Kathrynn Ducking, MD  carbidopa-levodopa (SINEMET IR) 25-100 MG tablet TAKE 1 tablet every 4 hours 03/09/20  Yes Kathrynn Ducking, MD  Carboxymethylcellul-Glycerin 1-0.25 % SOLN Place 1 drop into both eyes daily as needed (dry eyes).   Yes [provider]  escitalopram (LEXAPRO) 20 MG tablet Take 1 tablet (20 mg total) by mouth daily. 03/16/20  Yes Kathrynn Ducking, MD  famotidine (PEPCID) 20 MG tablet Take 1 tablet (20 mg  total) by mouth 2 (two) times daily as needed for heartburn or indigestion. 07/10/19  Yes Scot Jun, FNP  levothyroxine (SYNTHROID, LEVOTHROID) 50 MCG tablet Take 50 mcg daily before breakfast by mouth.   Yes [provider]  pantoprazole (PROTONIX) 40 MG tablet Take 1 tablet (40 mg total) by mouth daily. 08/10/18  Yes Fay Records, MD  pramipexole (MIRAPEX) 1 MG tablet TAKE 1 TABLET THREE TIMES A DAY 10/27/19  Yes Kathrynn Ducking, MD  QUEtiapine (SEROQUEL) 25 MG tablet TAKE 2 TABLETS (50 MG TOTAL) BY MOUTH AT BEDTIME. 10/16/18  Yes [provider]  SYNTHROID 75 MCG tablet  11/16/18  Yes [provider]    ROS:  Out of a complete 14 system review of symptoms, the patient complains only of the  following symptoms, and all other reviewed systems are negative.  Dyskinesias Walking problem Memory difficulty  Blood pressure (!) 102/56, pulse 81, height 5\' 2"  (1.575 m), weight 126 lb (57.2 kg).  Physical Exam  General: The patient is alert and cooperative at the time of the examination.  Skin: No significant peripheral edema is noted.   Neurologic Exam  Mental status: The patient is alert and oriented x 3 at the time of the examination. The patient has apparent normal recent and remote memory, with an apparently normal attention span and concentration ability.   Cranial nerves: Facial symmetry is present. Speech is normal, no aphasia or dysarthria is noted. Extraocular movements are full. Visual fields are full.  Mild masking of the face is seen.  Motor: The patient has good strength in all 4 extremities.  Sensory examination: Soft touch sensation is symmetric on the face, arms, and legs.  Coordination: The patient has good finger-nose-finger and heel-to-shin bilaterally.  Prominent dyskinesias are seen, with walking, dystonic posturing of the arms is noted.  Gait and station: The patient is able to rise from a seated position with arms crossed with some difficulty.  Once up, she can walk independently, appears to have fairly good stride and turns but does have dyskinetic movements while walking.  Tandem gait is unsteady.  Romberg is negative.  Reflexes: Deep tendon reflexes are symmetric.   Assessment/Plan:  1.  Parkinson's disease  2.  Memory disorder  3.  Gait disorder  4.  Leg cramps  The patient will be set up for physical therapy for her gait issues.  The patient is having increased dyskinesias in the afternoon, she likely is getting overmedicated with the Sinemet.  The patient will stop the 12 noon Sinemet 25/100 mg IR dose, if the dyskinesias remain severe throughout the afternoon, she will not get the 4 PM IR dosing either.  The baclofen will be increased to  10 mg at night for the leg cramps.  The patient was given a prescription for the amantadine.  She will follow-up here in 5 months, she may see Dr. Rexene Alberts.  Jill Alexanders MD 08/28/2020 11:28 AM  [facility] [address] [place], [place] [address]  Phone [phone] Fax [phone]

## 2020-08-28 NOTE — Patient Instructions (Signed)
With the Sinemet IR 25/100 tablets (carbidopa), hold the 12 noon dose, and hold the 4 PM dose if dyskinesias are bad.  We will go up on the baclofen to a full 10 mg tablet at night for the muscle cramps.

## 2020-08-28 NOTE — Telephone Encounter (Signed)
Referral has been sent to [facility]. Phone: [phone].

## 2020-09-04 ENCOUNTER — Other Ambulatory Visit: Payer: Self-pay | Admitting: Neurology

## 2020-09-04 DIAGNOSIS — G20 Parkinson's disease: Secondary | ICD-10-CM

## 2020-10-11 IMAGING — DX PORTABLE CHEST - 1 VIEW
1 series · 1 of 1 positions shown · non-contrast
Comparison: Radiographs February 25, 2017.

CLINICAL DATA: Shortness of breath.

EXAM:
PORTABLE CHEST 1 VIEW

[chest]
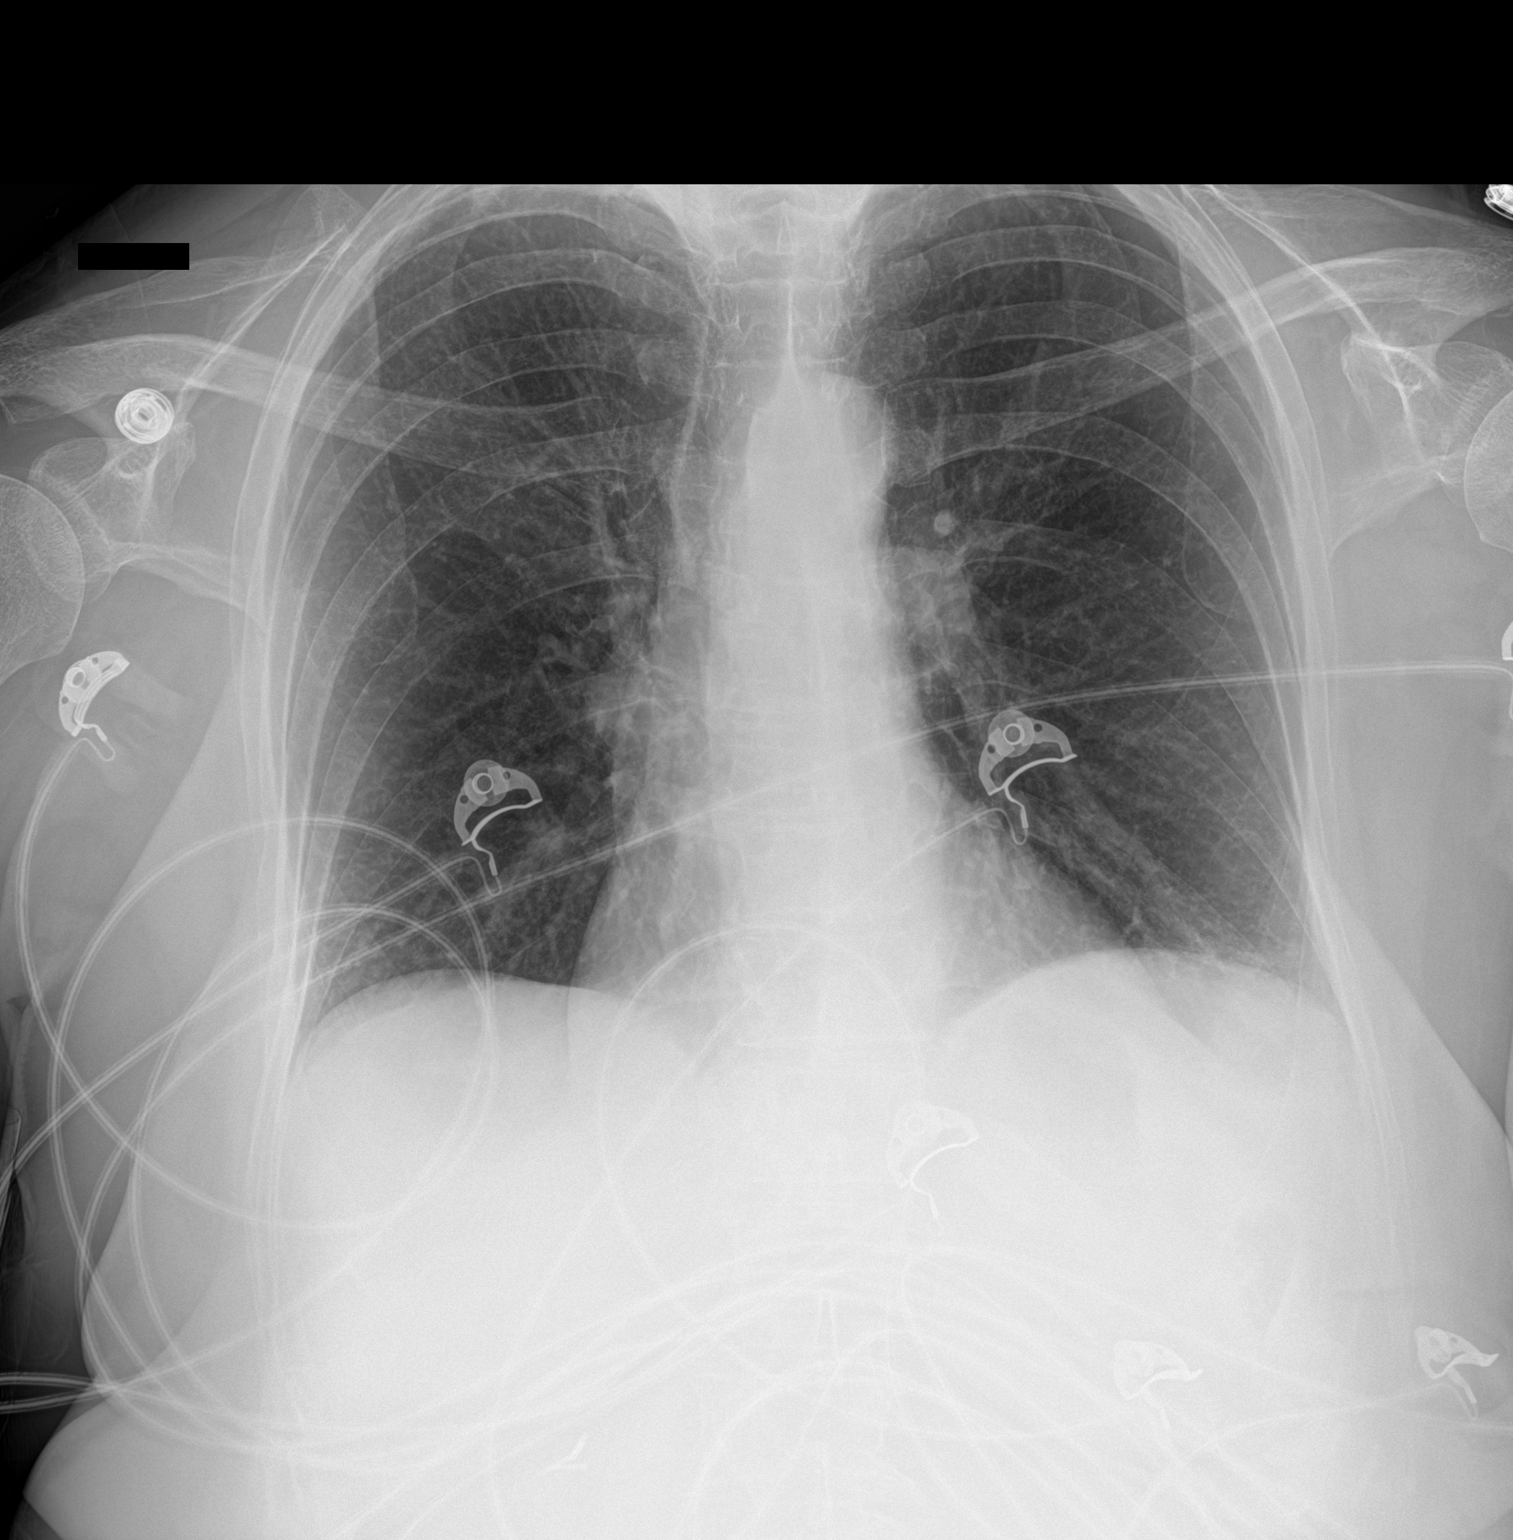

[1 of 1 positions shown; findings below may reference images not displayed]

FINDINGS: The heart size and mediastinal contours are within normal limits.
Both lungs are clear. No pneumothorax or pleural effusion is noted.
The visualized skeletal structures are unremarkable.
IMPRESSION: No active disease.

## 2020-10-13 ENCOUNTER — Ambulatory Visit: Payer: Medicare Other

## 2020-10-26 IMAGING — CT CT HEAR MORPH WITH CTA COR WITH SCORE WITH CA WITH CONTRAST AND
4 of 7 series · 8 of 20 positions shown, 9 images · IV contrast (APPLIED)
Comparison: None.
COMPARISON: None.

Addendum:
EXAM:
OVER-READ INTERPRETATION  CT CHEST

The following report is an over-read performed by radiologist Dr.
Nomasibulele Moatshe [REDACTED] on 08/13/2018. This over-read
does not include interpretation of cardiac or coronary anatomy or
pathology. The coronary CTA interpretation by the cardiologist is
attached.
CLINICAL DATA: 73-year-old female with dyspnea on exertion.
Cardiac/Coronary  CT
TECHNIQUE: The patient was scanned on a [employer] scanner.

[Series 6: best diast 74 % · axial · 0.39mm/px · z∈[-290,-244]mm · 2 of 346 slices shown, 3 images]
[im 116/346  vessel]
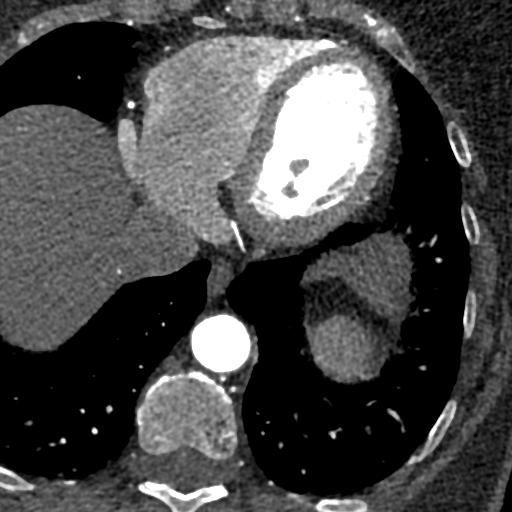
[im 116/346  lung]
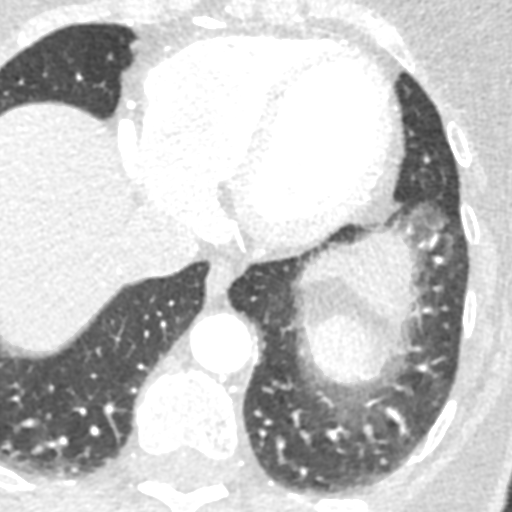
[im 231/346  vessel]
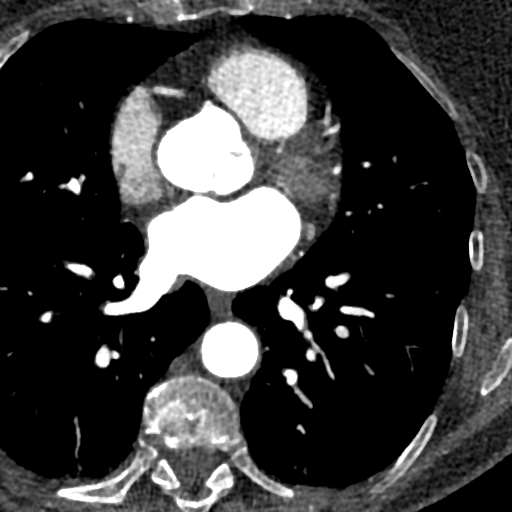

[Series 7: best syst 45 % · axial · 0.39mm/px · z∈[-290,-244]mm · 2 of 346 slices shown]
[im 116/346  vessel]
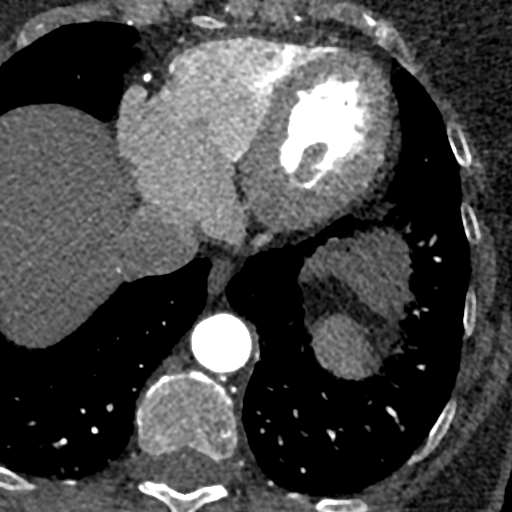
[im 231/346  vessel]
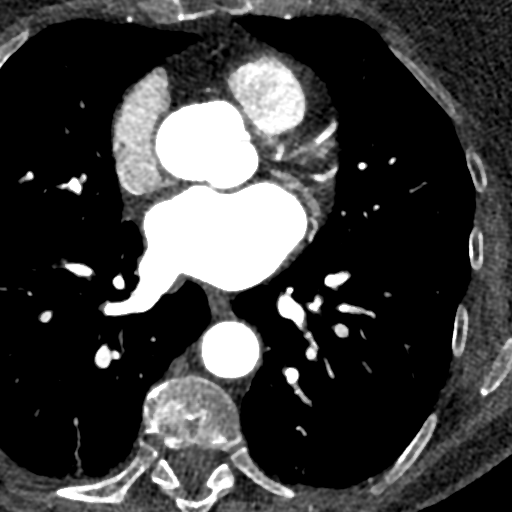

[Series 8: ts diast sharp 74 % · axial · 0.39mm/px · z∈[-290,-244]mm · 2 of 346 slices shown]
[im 116/346  lung]
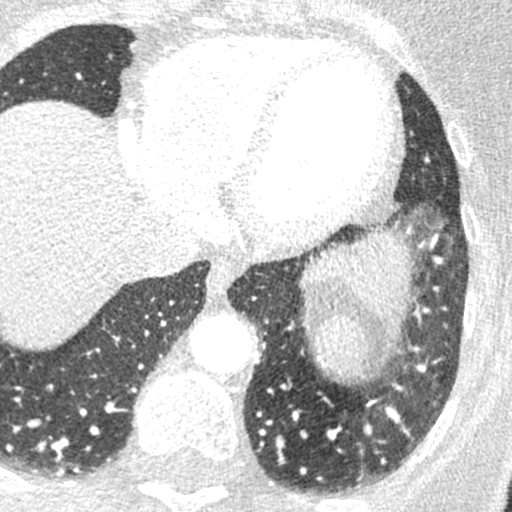
[im 231/346  lung]
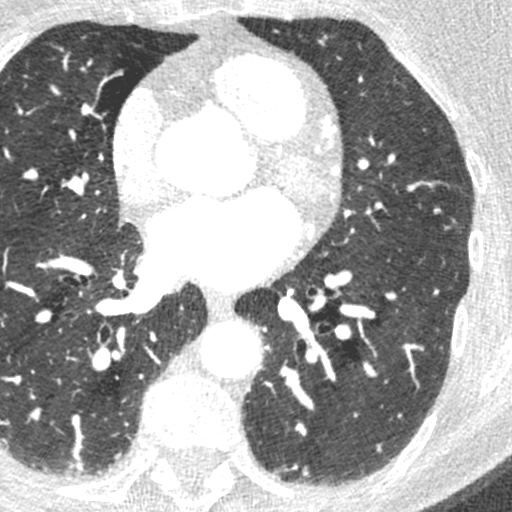

[Series 9: ts syst sharp 45 % · axial · 0.39mm/px · z∈[-290,-244]mm · 2 of 346 slices shown]
[im 116/346  lung]
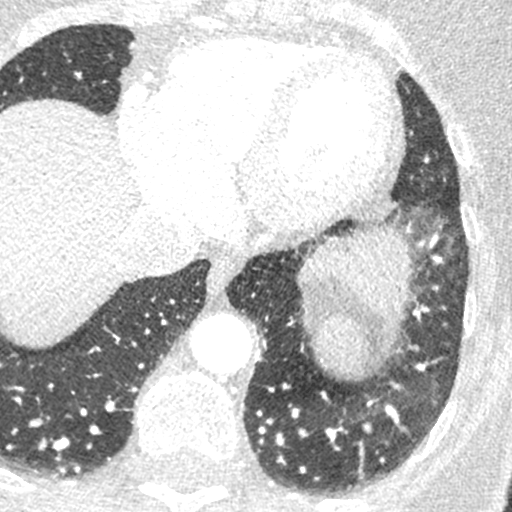
[im 231/346  lung]
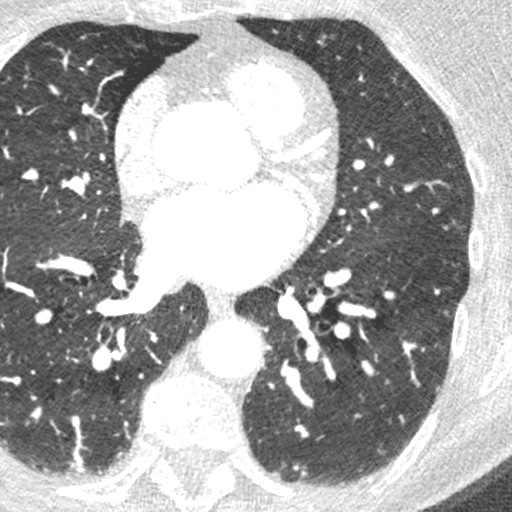

[8 of 20 positions shown; findings below may reference images not displayed]

FINDINGS: Vascular: Heart is normal size.  Aorta is normal caliber.

Mediastinum/Nodes: No adenopathy in the lower mediastinum or hila.

Lungs/Pleura: No confluent opacities or effusions.

Upper Abdomen: 3.3 cm low-density cyst in the posterior right
hepatic lobe. No acute findings.

Musculoskeletal: Chest wall soft tissues are unremarkable. No acute
bony abnormality.
IMPRESSION: No acute extra cardiac abnormality.
FINDINGS: A 120 kV prospective scan was triggered in the descending thoracic
aorta at 111 HU's. Axial non-contrast 3 mm slices were carried out
through the heart. The data set was analyzed on a dedicated work
station and scored using the Agatson method. Gantry rotation speed
was 250 msecs and collimation was .6 mm. No beta blockade and 0.8 mg
of sl NTG was given. The 3D data set was reconstructed in 5%
intervals of the 67-82 % of the R-R cycle. Diastolic phases were
analyzed on a dedicated work station using MPR, MIP and VRT modes.
The patient received 80 cc of contrast.

Aorta: Normal size. Minimal diffuse atherosclerotic plaque and
calcifications. No dissection.

Aortic Valve:  Trileaflet.  No calcifications.

Coronary Arteries:  Normal coronary origin.  Right dominance.

RCA is a large dominant artery that gives rise to PDA and PLVB.
There are only luminal irregularities.

Left main is a large artery that gives rise to LAD, a large ramus
intermedius and LCX arteries. Distal left main has minimal calcified
plaque with stenosis 0-25%.

LAD is a large vessel that gives rise to one diagonal artery.
Proximal LAD has a minimal calcified plaque with stenosis 0-25%. Mid
and distal LAD have only minimal irregularities.

Ramus intermedius is a large branch with only minimal
irregularities.

LCX is a small non-dominant artery.

Other findings:

Normal pulmonary vein drainage into the left atrium.

Normal let atrial appendage without a thrombus.

Normal size of the pulmonary artery.
IMPRESSION: 1. Coronary calcium score of 18. This was 49 percentile for age and
sex matched control.

2. Normal coronary origin with right dominance.

3. Mild nonobstructive CAD, risk factor modification is recommended.

*** End of Addendum ***
EXAM:
OVER-READ INTERPRETATION  CT CHEST

The following report is an over-read performed by radiologist Dr.
Nomasibulele Moatshe [REDACTED] on 08/13/2018. This over-read
does not include interpretation of cardiac or coronary anatomy or
pathology. The coronary CTA interpretation by the cardiologist is
attached.
FINDINGS: Vascular: Heart is normal size.  Aorta is normal caliber.

Mediastinum/Nodes: No adenopathy in the lower mediastinum or hila.

Lungs/Pleura: No confluent opacities or effusions.

Upper Abdomen: 3.3 cm low-density cyst in the posterior right
hepatic lobe. No acute findings.

Musculoskeletal: Chest wall soft tissues are unremarkable. No acute
bony abnormality.
IMPRESSION: No acute extra cardiac abnormality.

## 2020-10-29 ENCOUNTER — Encounter (HOSPITAL_COMMUNITY): Payer: Self-pay

## 2020-10-29 ENCOUNTER — Ambulatory Visit (HOSPITAL_COMMUNITY)
Admission: EM | Admit: 2020-10-29 | Discharge: 2020-10-29 | Disposition: A | Discharge status: Home / Self Care | Payer: Medicare Other | Attending: Emergency Medicine | Admitting: Emergency Medicine

## 2020-10-29 ENCOUNTER — Other Ambulatory Visit: Payer: Self-pay

## 2020-10-29 DIAGNOSIS — S00531A Contusion of lip, initial encounter: Secondary | ICD-10-CM | POA: Diagnosis not present

## 2020-10-29 DIAGNOSIS — M25562 Pain in left knee: Secondary | ICD-10-CM

## 2020-10-29 MED ORDER — BENZOCAINE 10 % MT GEL: 1.0000 "application " | OROMUCOSAL | 0 refills | Status: DC | PRN

## 2020-10-29 MED ORDER — IBUPROFEN 800 MG PO TABS: 800.0000 mg | ORAL_TABLET | Freq: Three times a day (TID) | ORAL | 0 refills | Status: DC

## 2020-10-29 NOTE — ED Triage Notes (Signed)
Pt states she fell last night and hit her face, and fell on left knee, denies losing conscious when she fell. Pts upper lip is swollen.

## 2020-10-29 NOTE — Discharge Instructions (Addendum)
There is low suspicion of injury to the bone, ligaments or tendons of your knee, I do believe it is sore and will be so for at least the next week or so and ideally it will improve with time  The Burgandy Hackworth tissue on your upper lip is a bruise and is healing appropriately, it should get better with time  You may use Orajel to provide comfort as needed  You may use ibuprofen every 8 hours as needed to help with knee soreness  If pain persist longer than 2 weeks please follow-up at urgent care for reevaluation for need of x-ray

## 2020-10-29 NOTE — ED Provider Notes (Signed)
[place]    CSN: [id number] Arrival date & time: 10/29/20  1022      History   Chief Complaint No chief complaint on file.   HPI Amy [name] is a 75 y.o. female.   Patient presents with left knee pain and bruise to upper lip after falling last night while walking across kitchen floor.  Painful to bear weight.  Range of motion intact.  Denies loss of consciousness, difficulty swallowing, dental pain, numbness, tingling, prior trauma or injury.  Uses cane intermittently at baseline.  History of Parkinson's disease, restless leg syndrome.  History obtained from husband who is at bedside.  Past Medical History:  Diagnosis Date   Dyspnea on exertion    Elevated blood sugar    Hair loss    Hashimoto's thyroiditis    Hypothyroidism    Impaired fasting blood sugar    Osteoporosis    Palpitations    Parkinson's disease ([place])    Parkinsonism ([place])    Restless leg syndrome 06/21/2019   Ventricular premature beats    Weakness     Patient Active Problem List   Diagnosis Date Noted   Restless leg syndrome 06/21/2019   Memory loss 01/29/2018   Parkinson's disease ([place]) 01/27/2017    Past Surgical History:  Procedure Laterality Date   CATARACT EXTRACTION Bilateral    CESAREAN SECTION     x2   CHOLECYSTECTOMY     FOOT SURGERY Right 01/15/2016   WRIST SURGERY  1999    OB History   No obstetric history on file.      Home Medications    Prior to Admission medications   Medication Sig Start Date End Date Taking? Authorizing Provider  alendronate (FOSAMAX) 70 MG tablet Take 70 mg by mouth once a week. Sunday 01/05/18  Yes [provider]  benzocaine (ORAJEL) 10 % mucosal gel Use as directed 1 application in the mouth or throat as needed for mouth pain. 10/29/20  Yes Breea Loncar R, NP  carbidopa-levodopa (SINEMET CR) 50-200 MG tablet TAKE 1 TABLET BY MOUTH EVERY 4 HOURS. 06/13/20  Yes Kathrynn Ducking, MD  carbidopa-levodopa (SINEMET IR) 25-100 MG  tablet TAKE 1 tablet every 4 hours 03/09/20  Yes Kathrynn Ducking, MD  ibuprofen (ADVIL) 800 MG tablet Take 1 tablet (800 mg total) by mouth 3 (three) times daily. 10/29/20  Yes Milen Lengacher, Leitha Schuller, NP  pramipexole (MIRAPEX) 1 MG tablet TAKE 1 TABLET THREE TIMES A DAY 10/27/19  Yes Kathrynn Ducking, MD  acetaminophen (TYLENOL) 500 MG tablet Take 2 tablets (1,000 mg total) by mouth every 6 (six) hours as needed. Patient taking differently: Take 1,000 mg by mouth every 6 (six) hours as needed for mild pain or headache. 02/26/17   Charlesetta Shanks, MD  amantadine (SYMMETREL) 100 MG capsule TAKE 1 CAPSULE BY MOUTH EVERY MORNING AT NOON & AT BEDTIME 08/28/20   Kathrynn Ducking, MD  aspirin EC 81 MG tablet Take 1 tablet (81 mg total) by mouth daily. 08/10/18   Fay Records, MD  baclofen (LIORESAL) 10 MG tablet Take 1 tablet (10 mg total) by mouth at bedtime. 08/28/20   Kathrynn Ducking, MD  Carboxymethylcellul-Glycerin 1-0.25 % SOLN Place 1 drop into both eyes daily as needed (dry eyes).    [provider]  escitalopram (LEXAPRO) 20 MG tablet Take 1 tablet (20 mg total) by mouth daily. 03/16/20   Kathrynn Ducking, MD  famotidine (PEPCID) 20 MG tablet Take 1 tablet (20  mg total) by mouth 2 (two) times daily as needed for heartburn or indigestion. 07/10/19   Scot Jun, FNP  levothyroxine (SYNTHROID, LEVOTHROID) 50 MCG tablet Take 50 mcg daily before breakfast by mouth.    [provider]  pantoprazole (PROTONIX) 40 MG tablet Take 1 tablet (40 mg total) by mouth daily. 08/10/18   Fay Records, MD  QUEtiapine (SEROQUEL) 25 MG tablet TAKE 2 TABLETS (50 MG TOTAL) BY MOUTH AT BEDTIME. 10/16/18   [provider]  SYNTHROID 75 MCG tablet  11/16/18   [provider]    Family History Family History  Problem Relation Age of Onset   Breast cancer Mother    Lung disease Brother     Social History Social History   Tobacco Use   Smoking status: Never   Smokeless tobacco:  Never  Vaping Use   Vaping Use: Never used  Substance Use Topics   Alcohol use: No   Drug use: No     Allergies   Ciprofloxacin   Review of Systems Review of Systems  Constitutional: Negative.   Respiratory: Negative.    Cardiovascular: Negative.   Skin: Negative.   Neurological: Negative.     Physical Exam Triage Vital Signs ED Triage Vitals  Enc Vitals Group     BP 10/29/20 1139 98/60     Pulse Rate 10/29/20 1139 87     Resp 10/29/20 1139 18     Temp 10/29/20 1139 97.7 F (36.5 C)     Temp Source 10/29/20 1139 Oral     SpO2 10/29/20 1139 98 %     Weight --      Height --      Head Circumference --      Peak Flow --      Pain Score 10/29/20 1130 5     Pain Loc --      Pain Edu? --      Excl. in [place]? --    No data found.  Updated Vital Signs BP 98/60 (BP Location: Right Arm)   Pulse 87   Temp 97.7 F (36.5 C) (Oral)   Resp 18   SpO2 98%   Visual Acuity Right Eye Distance:   Left Eye Distance:   Bilateral Distance:    Right Eye Near:   Left Eye Near:    Bilateral Near:     Physical Exam Constitutional:      Appearance: Normal appearance. She is normal weight.  HENT:     Mouth/Throat:     Comments: Bruising and mild swelling along the gumline of the upper lip no dental abnormalities noted Eyes:     Extraocular Movements: Extraocular movements intact.  Neurological:     Mental Status: She is alert.     UC Treatments / Results  Labs (all labs ordered are listed, but only abnormal results are displayed) Labs Reviewed - No data to display  EKG   Radiology No results found.  Procedures Procedures (including critical care time)  Medications Ordered in UC Medications - No data to display  Initial Impression / Assessment and Plan / UC Course  I have reviewed the triage vital signs and the nursing notes.  Pertinent labs & imaging results that were available during my care of the patient were reviewed by me and considered in my medical  decision making (see chart for details).  Acute pain of left knee Pruritus of upper lip  Low suspicion of injury to the bone, tendons or ligaments,  discussed with family, will defer x-ray at this time, advised reevaluation if pain persistent for 2 weeks, family and patient in agreement with plan  1.  Orajel 10% as needed 2.  Ibuprofen 800 mg every 8 hours as needed Final Clinical Impressions(s) / UC Diagnoses   Final diagnoses:  Bruise of upper lip  Acute pain of left knee     Discharge Instructions      There is low suspicion of injury to the bone, ligaments or tendons of your knee, I do believe it is sore and will be so for at least the next week or so and ideally it will improve with time  The Jeremiah Tarpley tissue on your upper lip is a bruise and is healing appropriately, it should get better with time  You may use Orajel to provide comfort as needed  You may use ibuprofen every 8 hours as needed to help with knee soreness  If pain persist longer than 2 weeks please follow-up at urgent care for reevaluation for need of x-ray   ED Prescriptions     Medication Sig Dispense Auth. Provider   benzocaine (ORAJEL) 10 % mucosal gel Use as directed 1 application in the mouth or throat as needed for mouth pain. 5.3 g Mattson Dayal R, NP   ibuprofen (ADVIL) 800 MG tablet Take 1 tablet (800 mg total) by mouth 3 (three) times daily. 21 tablet Delainie Chavana, Leitha Schuller, NP      PDMP not reviewed this encounter.   Hans Eden, NP 10/29/20 1222

## 2020-11-30 DIAGNOSIS — R109 Unspecified abdominal pain: Secondary | ICD-10-CM | POA: Diagnosis not present

## 2020-12-05 DIAGNOSIS — U071 COVID-19: Secondary | ICD-10-CM | POA: Diagnosis not present

## 2020-12-14 ENCOUNTER — Other Ambulatory Visit: Payer: Self-pay | Admitting: Neurology

## 2020-12-22 DIAGNOSIS — E785 Hyperlipidemia, unspecified: Secondary | ICD-10-CM | POA: Diagnosis not present

## 2020-12-22 DIAGNOSIS — Z0000 Encounter for general adult medical examination without abnormal findings: Secondary | ICD-10-CM | POA: Diagnosis not present

## 2020-12-22 DIAGNOSIS — R7303 Prediabetes: Secondary | ICD-10-CM | POA: Diagnosis not present

## 2020-12-22 DIAGNOSIS — Z23 Encounter for immunization: Secondary | ICD-10-CM | POA: Diagnosis not present

## 2020-12-22 DIAGNOSIS — G2581 Restless legs syndrome: Secondary | ICD-10-CM | POA: Diagnosis not present

## 2020-12-22 DIAGNOSIS — M810 Age-related osteoporosis without current pathological fracture: Secondary | ICD-10-CM | POA: Diagnosis not present

## 2020-12-22 DIAGNOSIS — Z1389 Encounter for screening for other disorder: Secondary | ICD-10-CM | POA: Diagnosis not present

## 2020-12-22 DIAGNOSIS — F411 Generalized anxiety disorder: Secondary | ICD-10-CM | POA: Diagnosis not present

## 2020-12-22 DIAGNOSIS — E039 Hypothyroidism, unspecified: Secondary | ICD-10-CM | POA: Diagnosis not present

## 2020-12-22 DIAGNOSIS — G20 Parkinson's disease: Secondary | ICD-10-CM | POA: Diagnosis not present

## 2020-12-22 DIAGNOSIS — B0229 Other postherpetic nervous system involvement: Secondary | ICD-10-CM | POA: Diagnosis not present

## 2020-12-22 DIAGNOSIS — K5903 Drug induced constipation: Secondary | ICD-10-CM | POA: Diagnosis not present

## 2021-01-22 ENCOUNTER — Ambulatory Visit (INDEPENDENT_AMBULATORY_CARE_PROVIDER_SITE_OTHER): Payer: Medicare Other | Admitting: Neurology

## 2021-01-22 ENCOUNTER — Encounter: Payer: Self-pay | Admitting: Neurology

## 2021-01-22 VITALS — BP 81/52 | HR 82 | Ht 60.0 in | Wt 115.4 lb

## 2021-01-22 DIAGNOSIS — G249 Dystonia, unspecified: Secondary | ICD-10-CM | POA: Diagnosis not present

## 2021-01-22 DIAGNOSIS — K5909 Other constipation: Secondary | ICD-10-CM

## 2021-01-22 DIAGNOSIS — G20 Parkinson's disease: Secondary | ICD-10-CM

## 2021-01-22 DIAGNOSIS — R443 Hallucinations, unspecified: Secondary | ICD-10-CM

## 2021-01-22 DIAGNOSIS — I959 Hypotension, unspecified: Secondary | ICD-10-CM

## 2021-01-22 MED ORDER — CARBIDOPA-LEVODOPA 25-100 MG PO TABS: ORAL_TABLET | ORAL | 3 refills | Status: DC

## 2021-01-22 NOTE — Patient Instructions (Addendum)
It was today.  As discussed, I think it is important that you stick to the schedule with your levodopa and other Parkinson's medications.  You have developed complications from taking levodopa including low blood pressure which is quite low today as well as involuntary movements, which we call dyskinesias.  We can consider Nuplazid in the near future for hallucinations.  For now, please take Sinemet ER 50-200 mg strength 1 pill 6 times a day at 6 AM, 9 AM, 12, 3 PM, 6 PM, and 9 PM daily.  Take Sinemet IR, immediate release, 25-100 mg strength 1 pill at 6 AM, 8 AM, 10 AM, 12, 2 PM, 4 PM, 6 PM and 8 PM daily.  Do not take more than what is prescribed, you have already developed complications from medications.  Take amantadine 100 mg 3 times daily at 6 AM, 12, and 6 PM daily, take pramipexole 1 mg strength at 6 AM, 12, and 6 PM daily.  Please follow-up to see one of our nurse practitioners in 3 to 4 months.  We can consider Nuplazid at the time.  Please use your cane consistently for gait safety, I would recommend you consider using a walker for fall risk.  If you need, I can prescribe a walker for you.

## 2021-01-22 NOTE — Progress Notes (Signed)
Subjective:    Patient ID: Amy [name] is a 75 y.o. female.  HPI    Interim history:   Amy [name] is a 75 year old right-handed woman with an underlying medical history of hypothyroidism, Hashimoto's thyroiditis, osteoporosis, palpitations, restless leg syndrome, PVCs, and impaired fasting glucose, who presents for follow-up consultation of her parkinsonism, complicated by dyskinesias, memory loss, and low blood pressure.  The patient is accompanied by her husband today and presents for a follow-up appointment.  She is to follow with Dr. Jannifer Franklin in this office and last saw Dr. Jannifer Franklin on 08/28/2020.  I reviewed the note and copied it for reference below.   She has been on Sinemet CR and IR every 4 hours, she has been on amantadine 100 mg 3 times daily and takes baclofen 10 mg at bedtime for leg cramps.  Today, 01/22/2021: She reports very little of her own history, and her husband provides most of her history.  Unfortunately, he is not very detailed regarding her treatment regimen.  She is on both Sinemet CR and Sinemet IR, multiple doses per day.  Approximately, he gives her the CR every 2-3 hours and the IR 2 pills every 2 hours or so.  He estimates that she takes up to 10 or 12 pills of the IR and may be up to 8 pills of the CR.  Again there is no set schedule for her medications.  She takes amantadine 3 times a day but not on a set schedule and she takes Mirapex 1 mg strength 3 times a day but also not on a schedule.  She has occasional constipation and has typically had a bowel movement every day.  She tries to hydrate well with water.  She does not drink any alcohol, no recent falls are reported.  He feels that she has intermittent hallucinations and confusion.  Of note, she is not currently taking Seroquel and per husband she stopped it after trying it for a few days, it seemed to make her hallucinations worse.  Her mom had Parkinson's disease or tremor.  The patient's allergies, current  medications, family history, past medical history, past social history, past surgical history and problem list were reviewed and updated as appropriate.   Previously:   08/28/20 (Dr. Jannifer Franklin): <<Amy [name] is a 75 year old right-handed white female with a history of Parkinson's disease and an associated memory disorder.  The patient is having some issues with gait instability, she may fall on occasion.  She reports a lot of problems with word finding that is becoming an increasing problem for her.  She is sleeping fairly well at night.  She may have some problems with leg cramps during the day and during the nighttime, this may wake her up at night.  She is getting Sinemet CR 50/200 mg tablets every 4 hours while awake and may take a 25/100 mg tablet between these doses.  The patient has a lot of dyskinesias that apparently worsen in the afternoon.  The patient takes baclofen 5 mg at night for leg cramps.  She returns to the office today for further evaluation.>>  Her Past Medical History Is Significant For: Past Medical History:  Diagnosis Date   Dyspnea on exertion    Elevated blood sugar    Hair loss    Hashimoto's thyroiditis    Hypothyroidism    Impaired fasting blood sugar    Osteoporosis    Palpitations    Parkinson's disease (HCC)    Parkinsonism (HCC)    Restless  leg syndrome 06/21/2019   Ventricular premature beats    Weakness     Her Past Surgical History Is Significant For: Past Surgical History:  Procedure Laterality Date   CATARACT EXTRACTION Bilateral    CESAREAN SECTION     x2   CHOLECYSTECTOMY     FOOT SURGERY Right 01/15/2016   WRIST SURGERY  1999    Her Family History Is Significant For: Family History  Problem Relation Age of Onset   Breast cancer Mother    Lung disease Brother     Her Social History Is Significant For: Social History   Socioeconomic History   Marital status: Married    Spouse name: Richard   Number of children: 2   Years of education:  12   Highest education level: Not on file  Occupational History   Occupation: Fish farm manager tax returns for [employer]  Tobacco Use   Smoking status: Never   Smokeless tobacco: Never  Vaping Use   Vaping Use: Never used  Substance and Sexual Activity   Alcohol use: No   Drug use: No   Sexual activity: Not on file  Other Topics Concern   Not on file  Social History Narrative   Lives with husband   Caffeine use: none   Right handed       Pt uses [email or web address] for medication refills.    Phone [phone]    Fax [phone]     Social Determinants of Health   Financial Resource Strain: Not on file  Food Insecurity: Not on file  Transportation Needs: Not on file  Physical Activity: Not on file  Stress: Not on file  Social Connections: Not on file    Her Allergies Are:  Allergies  Allergen Reactions   Ciprofloxacin Rash  :   Her Current Medications Are:  Outpatient Encounter Medications as of 01/22/2021  Medication Sig   acetaminophen (TYLENOL) 500 MG tablet Take 2 tablets (1,000 mg total) by mouth every 6 (six) hours as needed. (Patient taking differently: Take 1,000 mg by mouth every 6 (six) hours as needed for mild pain or headache.)   alendronate (FOSAMAX) 70 MG tablet Take 70 mg by mouth once a week. Sunday   amantadine (SYMMETREL) 100 MG capsule TAKE 1 CAPSULE BY MOUTH EVERY MORNING AT NOON & AT BEDTIME   aspirin EC 81 MG tablet Take 1 tablet (81 mg total) by mouth daily.   baclofen (LIORESAL) 10 MG tablet Take 1 tablet (10 mg total) by mouth at bedtime.   benzocaine (ORAJEL) 10 % mucosal gel Use as directed 1 application in the mouth or throat as needed for mouth pain.   carbidopa-levodopa (SINEMET CR) 50-200 MG tablet TAKE 1 TABLET BY MOUTH EVERY 4 HOURS   carbidopa-levodopa (SINEMET IR) 25-100 MG tablet TAKE 1 tablet every 4 hours   Carboxymethylcellul-Glycerin 1-0.25 % SOLN Place 1 drop into both eyes daily as needed (dry eyes).   escitalopram (LEXAPRO) 20 MG  tablet Take 1 tablet (20 mg total) by mouth daily.   famotidine (PEPCID) 20 MG tablet Take 1 tablet (20 mg total) by mouth 2 (two) times daily as needed for heartburn or indigestion.   ibuprofen (ADVIL) 800 MG tablet Take 1 tablet (800 mg total) by mouth 3 (three) times daily.   pantoprazole (PROTONIX) 40 MG tablet Take 1 tablet (40 mg total) by mouth daily.   pramipexole (MIRAPEX) 1 MG tablet TAKE 1 TABLET THREE TIMES A DAY   QUEtiapine (SEROQUEL) 25 MG tablet TAKE 2 TABLETS (  50 MG TOTAL) BY MOUTH AT BEDTIME.   SYNTHROID 75 MCG tablet Take 75 mcg by mouth daily before breakfast.   [DISCONTINUED] levothyroxine (SYNTHROID, LEVOTHROID) 50 MCG tablet Take 50 mcg daily before breakfast by mouth.   No facility-administered encounter medications on file as of 01/22/2021.  :  Review of Systems:  Out of a complete 14 point review of systems, all are reviewed and negative with the exception of these symptoms as listed below:       Review of Systems  Neurological:        Has had 2 falls, bruises.  Had COVID 2 months ago approx.  Using CL CR and IR alternating every 3 hours. (May use more as needed).     Objective:  Neurological Exam  Physical Exam Physical Examination:   Vitals:   01/22/21 0852  BP: (!) 81/52  Pulse: 82   General Examination: The patient is a very pleasant 75 y.o. female in no acute distress. She appears appears mildly frail, well-groomed, has mild generalized dyskinesias.    HEENT: Normocephalic, atraumatic, pupils are equal, round and reactive to light, extraocular tracking is mildly impaired, hearing is grossly intact.  She has mild facial masking, mild nuchal rigidity, no carotid bruits.  Airway examination reveals mild mouth dryness.  Tongue protrudes centrally and palate elevates symmetrically.  Mild hypophonia, speech is scant.  No significant dysarthria noted.    Chest: Clear to auscultation without wheezing, rhonchi or crackles noted.  Heart: S1+S2+0, regular  and normal without murmurs, rubs or gallops noted.   Abdomen: Soft, non-tender and non-distended with normal bowel sounds appreciated on auscultation.  Extremities: There is no pitting edema in the distal lower extremities bilaterally.   Skin: Warm and dry without trophic changes noted.   Musculoskeletal: exam reveals no obvious joint deformities, tenderness or joint swelling or erythema.   Neurologically:  Mental status: The patient is awake, alert and oriented in all 4 spheres. Her immediate and remote memory, attention, language skills and fund of knowledge are mildly impaired, she is not giving a detailed history.   Cranial nerves II - XII are as described above under HEENT exam.  Motor exam: Mild, global strength of 4 out of 5, Romberg is not tested for safety concerns, mild generalized dyskinesias noted, mild slowness, no significant increase in tone noted, no resting tremor or postural tremor.  Fine motor skills are globally mild to moderately impaired, no telltale lateralization noted.   Cerebellar testing: No dysmetria or intention tremor.  Sensory exam: intact to light touch in the upper and lower extremities.  Gait, station and balance: She stands with mild difficulty, she requires no assistance.  Posture is moderately stooped and she has mild dystonic posturing with the right arm while walking.  No telltale shuffling, decreased arm swing bilaterally, mildly unsteady while walking, no walking aid.  Assessment and Plan:   In summary, Amy [name] is a very pleasant 75 y.o.-year old female with an underlying medical history of hypothyroidism, Hashimoto's thyroiditis, osteoporosis, palpitations, restless leg syndrome, PVCs, and impaired fasting glucose, who presents for follow-up consultation of her parkinsonism, complicated by dyskinesias, hallucinations, low blood pressure, constipation, and memory loss.  She has a significantly low blood pressure but does not have any significant  symptoms today.  She does have generalized dyskinesias and I believe that she is taking too much levodopa at this point.  They are not completely clear as to how much Sinemet CR and Sinemet IR she is actually taking as  it is different from day-to-day and he doses her levodopa every 2 hours approximately.  It is written for every 4 hours.  I had a long discussion with the patient and her husband about the importance of keeping scheduled and not overusing levodopa as it can cause side effects including hypotension and dyskinesias as well as exacerbate hallucinations.  Of note, she has not been taking Seroquel, we can certainly consider Nuplazid in the near future but for now, I would like to work towards streamlining medication regimen.  To that end, she is advised to take her amantadine 100 mg 3 times daily at 6 AM, 12 and 6 PM as well as her Mirapex 1 mg strength at those times.  She is advised to take her Sinemet CR 6 times a day at 6 AM, 9 AM, 12, 3 PM, 6 PM and 9 PM daily, she is advised to take 1 pill of Sinemet IR 25-100 mg strength at 6 AM, 8 AM, 10 AM, 12, 2 PM, 4 PM, 6 PM and 8 PM daily for total of 8 pills daily and this ER will be a total of 6 pills daily of the 50-200 mg strength.  She is advised to follow-up to see one of our nurse practitioners in 3 to 4 months.  We talked about the importance of gait safety and fall prevention as well as being proactive about constipation issues.  She is advised to use her cane at all times and consider starting to use a walker for gait safety.  I will be happy to write for walker if the need arises.  She is advised to drink plenty water and stay well-hydrated.  They are encouraged to call with any interim questions or concerns.  I answered all the questions today and the patient and her husband were in agreement.  This was an extended visit. I spent 45 minutes in total face-to-face time and in reviewing records during pre-charting, more than 50% of which was spent in  counseling and coordination of care, reviewing test results, reviewing medications and treatment regimen and/or in discussing or reviewing the diagnosis of PD, the prognosis and treatment options. Pertinent laboratory and imaging test results that were available during this visit with the patient were reviewed by me and considered in my medical decision making (see chart for details).

## 2021-01-29 ENCOUNTER — Ambulatory Visit: Payer: Medicare Other | Admitting: Neurology

## 2021-03-14 ENCOUNTER — Other Ambulatory Visit: Payer: Self-pay | Admitting: *Deleted

## 2021-03-14 MED ORDER — PRAMIPEXOLE DIHYDROCHLORIDE 1 MG PO TABS: 1.0000 mg | ORAL_TABLET | Freq: Three times a day (TID) | ORAL | 0 refills | Status: DC

## 2021-05-01 ENCOUNTER — Other Ambulatory Visit: Payer: Self-pay

## 2021-05-01 ENCOUNTER — Encounter: Payer: Self-pay | Admitting: Neurology

## 2021-05-01 ENCOUNTER — Ambulatory Visit (INDEPENDENT_AMBULATORY_CARE_PROVIDER_SITE_OTHER): Payer: Medicare Other | Admitting: Neurology

## 2021-05-01 VITALS — BP 111/70 | HR 92 | Ht 60.0 in | Wt 112.8 lb

## 2021-05-01 DIAGNOSIS — I959 Hypotension, unspecified: Secondary | ICD-10-CM | POA: Diagnosis not present

## 2021-05-01 DIAGNOSIS — K5909 Other constipation: Secondary | ICD-10-CM | POA: Diagnosis not present

## 2021-05-01 DIAGNOSIS — G249 Dystonia, unspecified: Secondary | ICD-10-CM | POA: Diagnosis not present

## 2021-05-01 DIAGNOSIS — G20 Parkinson's disease: Secondary | ICD-10-CM

## 2021-05-01 DIAGNOSIS — R443 Hallucinations, unspecified: Secondary | ICD-10-CM | POA: Diagnosis not present

## 2021-05-01 DIAGNOSIS — R413 Other amnesia: Secondary | ICD-10-CM | POA: Diagnosis not present

## 2021-05-01 NOTE — Patient Instructions (Addendum)
Please continue with Sinemet CR 6 times a day at 6 AM, 9 AM, 12, 3 PM, 6 PM and 9 PM daily.  Take Sinemet IR 25-100 mg strength at 6 AM, 8 AM, 10 AM, 12, 2 PM, 4 PM, 6 PM and 8 PM daily for total of 8 pills daily.  As discussed, I will refer you to [facility] neurology for further management of your Parkinson's disease.   I will refer you to neuro rehab, PT. You may need to start using a walker for gait safety.

## 2021-05-01 NOTE — Progress Notes (Signed)
Subjective:    Patient ID: Amy [name] is a 76 y.o. female.  HPI    Interim history:   Amy [name] is a 76 year old right-handed woman with an underlying medical history of hypothyroidism, Hashimoto's thyroiditis, osteoporosis, palpitations, restless leg syndrome, PVCs, and impaired fasting glucose, who presents for follow-up consultation of her parkinsonism, complicated by dyskinesias, memory loss, and hypotension. The patient is accompanied by her husband today. I first met her on 01/22/21 and she previously followed with Dr. Jannifer Franklin. On 01/22/2021, she was not on a set schedule for her C/L IR and CR. She was taking multiple doses per day of each. Her husband estimated that she took about 10 or 12 pills of the IR and may be up to 8 pills of the CR each day.  He reported that she had intermittent hallucinations and confusion. She was no longer on Seroquel and per husband she had stopped it after trying it for a few days. Her BP was 81/52 at the visit. I suggest we keep her on a set schedule for her medication, with Sinemet ER 50-200 mg strength 1 pill 6 times a day at 6 AM, 9 AM, 12, 3 PM, 6 PM, and 9 PM daily, and Sinemet IR, 25-100 mg strength 1 pill 8 times a day, at 6 AM, 8 AM, 10 AM, 12, 2 PM, 4 PM, 6 PM and 8 PM daily. She was on amantadine 100 mg tid and Mirapex 1 mg tid as well.  Today, 05/01/2021: She reports not feeling so well, she is anxious, she is restless.  Her husband reports that she has a variable sleep schedule and they are trying to keep the Sinemet IR on a 2 hourly schedule but he does give her medication in between still.  She has not fallen, sometimes she has freezing and he has to assist her typically when she needs to get out of bed at night to go to the bathroom.  She does not have a walker, she has a cane but did not bring it today.  They have not seen physical therapy in a while, the last referral did not go through for some reason.  He reports that she still has  hallucinations.   The patient's allergies, current medications, family history, past medical history, past social history, past surgical history and problem list were reviewed and updated as appropriate.    Previously:    08/28/20 (Dr. Jannifer Franklin): <<Amy [name] is a 76 year old right-handed white female with a history of Parkinson's disease and an associated memory disorder.  The patient is having some issues with gait instability, she may fall on occasion.  She reports a lot of problems with word finding that is becoming an increasing problem for her.  She is sleeping fairly well at night.  She may have some problems with leg cramps during the day and during the nighttime, this may wake her up at night.  She is getting Sinemet CR 50/200 mg tablets every 4 hours while awake and may take a 25/100 mg tablet between these doses.  The patient has a lot of dyskinesias that apparently worsen in the afternoon.  The patient takes baclofen 5 mg at night for leg cramps.  She returns to the office today for further evaluation.>>  Her Past Medical History Is Significant For: Past Medical History:  Diagnosis Date   Dyspnea on exertion    Elevated blood sugar    Hair loss    Hashimoto's thyroiditis    Hypothyroidism  Impaired fasting blood sugar    Osteoporosis    Palpitations    Parkinson's disease (HCC)    Parkinsonism (HCC)    Restless leg syndrome 06/21/2019   Ventricular premature beats    Weakness     Her Past Surgical History Is Significant For: Past Surgical History:  Procedure Laterality Date   CATARACT EXTRACTION Bilateral    CESAREAN SECTION     x2   CHOLECYSTECTOMY     FOOT SURGERY Right 01/15/2016   WRIST SURGERY  1999    Her Family History Is Significant For: Family History  Problem Relation Age of Onset   Breast cancer Mother    Lung disease Brother    Parkinson's disease Neg Hx     Her Social History Is Significant For: Social History   Socioeconomic History   Marital  status: Married    Spouse name: Richard   Number of children: 2   Years of education: 12   Highest education level: Not on file  Occupational History   Occupation: Fish farm manager tax returns for [employer]  Tobacco Use   Smoking status: Never   Smokeless tobacco: Never  Vaping Use   Vaping Use: Never used  Substance and Sexual Activity   Alcohol use: No   Drug use: No   Sexual activity: Not on file  Other Topics Concern   Not on file  Social History Narrative   Lives with husband   Caffeine use: none   Right handed       Pt uses [email or web address] for medication refills.    Phone [phone]    Fax [phone]     Social Determinants of Health   Financial Resource Strain: Not on file  Food Insecurity: Not on file  Transportation Needs: Not on file  Physical Activity: Not on file  Stress: Not on file  Social Connections: Not on file    Her Allergies Are:  Allergies  Allergen Reactions   Ciprofloxacin Rash  :   Her Current Medications Are:  Outpatient Encounter Medications as of 05/01/2021  Medication Sig   acetaminophen (TYLENOL) 500 MG tablet Take 2 tablets (1,000 mg total) by mouth every 6 (six) hours as needed. (Patient taking differently: Take 1,000 mg by mouth every 6 (six) hours as needed for mild pain or headache.)   alendronate (FOSAMAX) 70 MG tablet Take 70 mg by mouth once a week. Sunday   amantadine (SYMMETREL) 100 MG capsule TAKE 1 CAPSULE BY MOUTH EVERY MORNING AT NOON & AT BEDTIME   aspirin EC 81 MG tablet Take 1 tablet (81 mg total) by mouth daily.   baclofen (LIORESAL) 10 MG tablet Take 1 tablet (10 mg total) by mouth at bedtime.   benzocaine (ORAJEL) 10 % mucosal gel Use as directed 1 application in the mouth or throat as needed for mouth pain.   carbidopa-levodopa (SINEMET CR) 50-200 MG tablet TAKE 1 TABLET BY MOUTH EVERY 4 HOURS   carbidopa-levodopa (SINEMET IR) 25-100 MG tablet Take 1 tablet at 6 AM, 8 AM, 10 AM, 12, 2 PM, 4 PM, 6 PM and 8 PM daily    Carboxymethylcellul-Glycerin 1-0.25 % SOLN Place 1 drop into both eyes daily as needed (dry eyes).   escitalopram (LEXAPRO) 20 MG tablet Take 1 tablet (20 mg total) by mouth daily.   famotidine (PEPCID) 20 MG tablet Take 1 tablet (20 mg total) by mouth 2 (two) times daily as needed for heartburn or indigestion.   ibuprofen (ADVIL) 800 MG tablet Take 1 tablet (  800 mg total) by mouth 3 (three) times daily.   pantoprazole (PROTONIX) 40 MG tablet Take 1 tablet (40 mg total) by mouth daily.   pramipexole (MIRAPEX) 1 MG tablet Take 1 tablet (1 mg total) by mouth 3 (three) times daily.   QUEtiapine (SEROQUEL) 25 MG tablet TAKE 2 TABLETS (50 MG TOTAL) BY MOUTH AT BEDTIME.   SYNTHROID 75 MCG tablet Take 75 mcg by mouth daily before breakfast.   No facility-administered encounter medications on file as of 05/01/2021.  :  Review of Systems:  Out of a complete 14 point review of systems, all are reviewed and negative with the exception of these symptoms as listed below:   Review of Systems  Neurological:        Pt is here parkinson's  follow up Husband has questions about patient medications. Husband said not much change since last visit .   Objective:  Neurological Exam  Physical Exam Physical Examination:   Vitals:   05/01/21 1048  BP: 111/70  Pulse: 92    General Examination: The patient is a very pleasant 76 y.o. female in no acute distress. She appears frail, well-groomed, she is restless and anxious appearing, she appears to have lower body akathisia.  HEENT: Normocephalic, atraumatic, pupils are equal, round and reactive to light, extraocular tracking is mildly impaired, hearing is grossly intact.  She has mild facial masking, mild nuchal rigidity, no carotid bruits.  Airway examination reveals mild mouth dryness.  Tongue protrudes centrally and palate elevates symmetrically.  Mild hypophonia, speech is scant.  No significant dysarthria noted.     Chest: Clear to auscultation without  wheezing, rhonchi or crackles noted.   Heart: S1+S2+0, regular and normal without murmurs, rubs or gallops noted.    Abdomen: Soft, non-tender and non-distended.   Extremities: There is no pitting edema in the distal lower extremities bilaterally.    Skin: Warm and dry without trophic changes noted.    Musculoskeletal: exam reveals no obvious joint deformities.    Neurologically:  Mental status: The patient is awake, alert and oriented in all 4 spheres. Her immediate and remote memory, attention, language skills and fund of knowledge are mildly impaired, she is not giving a detailed history.   Cranial nerves II - XII are as described above under HEENT exam.  Motor exam: Thin bulk, global strength of 4 out of 5, Romberg is not tested for safety concerns, no significant dyskinesias, mild to moderate slowness, no obvious resting tremor in the upper body, she has a resting tremor in both feet but also restlessness in both feet with akathisia and difficulty sitting still, not quite like dyskinesias.   Fine motor skills are globally mild to moderately impaired, no telltale lateralization noted.   Cerebellar testing: No dysmetria or intention tremor.  Sensory exam: intact to light touch in the upper and lower extremities.  Gait, station and balance: She stands with mild difficulty, she requires some assistance from her husband.  Posture is moderately stooped and she has mild dystonic posturing with the right arm while walking.  She has mild freezing, and stutter steps, no walking aid.  Balance is impaired.  Assessment and Plan:    In summary, Tracia Lacomb is a very pleasant 76 year old female with an underlying medical history of hypothyroidism, Hashimoto's thyroiditis, osteoporosis, palpitations, restless leg syndrome, PVCs, and impaired fasting glucose, who presents for follow-up consultation of her advanced parkinsonism, complicated by dyskinesias, hallucinations, low blood pressure, constipation,  and memory loss.  She has  a significantly low blood pressure values and ongoing hallucinations, has had restlessness which appears to be more akin to akathisia today.  She was taking quite a bit of Sinemet CR and IR at her last appointment in November without a clear-cut total dose.   I again discussed the importance of maintaining a known schedule of her medications as overusing levodopa can cause significant repercussions including worsening dyskinesias, worsening hallucinations, severe hypotension. Of note, she has not been taking Seroquel, we can certainly consider Nuplazid in the near future but for now, I would like to request further management help from a academic movement disorder specialist, she may be a candidate for duopa pump.  I will make a referral to outpatient physical therapy at neuro rehab.  She is advised to continue with her current medication regimen including amantadine 100 mg 3 times daily at 6 AM, 12 and 6 PM, Mirapex 1 mg strength tid, Sinemet CR 6 times a day at 6 AM, 9 AM, 12, 3 PM, 6 PM and 9 PM daily and Sinemet IR 25-100 mg strength at 6 AM, 8 AM, 10 AM, 12, 2 PM, 4 PM, 6 PM and 8 PM daily for total of 8 pills daily.  Based on her physical therapy evaluation, I would likely write for a walker.  She did not bring her cane today and she is encouraged to use her cane consistently.  We talked about the importance of fall prevention.  I answered all the questions today and the patient and her husband were in agreement.  I spent 30 minutes in total face-to-face time and in reviewing records during pre-charting, more than 50% of which was spent in counseling and coordination of care, reviewing test results, reviewing medications and treatment regimen and/or in discussing or reviewing the diagnosis of PD, the prognosis and treatment options. Pertinent laboratory and imaging test results that were available during this visit with the patient were reviewed by me and considered in my medical  decision making (see chart for details).

## 2021-05-02 ENCOUNTER — Telehealth: Payer: Self-pay | Admitting: Neurology

## 2021-05-02 NOTE — Telephone Encounter (Signed)
Sent to [facility] ph # [phone] °

## 2021-06-10 ENCOUNTER — Other Ambulatory Visit: Payer: Self-pay | Admitting: Neurology

## 2021-06-11 ENCOUNTER — Telehealth: Payer: Self-pay | Admitting: *Deleted

## 2021-06-11 NOTE — Telephone Encounter (Signed)
While addressing a refill request, I noticed the pt had not been scheduled with [facility] nor physical therapy next door. Neuro PT tried to call pt at the end of February. Referral was also sent to [facility] on 05/02/21. I tried to call the pt to follow-up. LVM asking for call back.  ? ?When the patient calls back, please ask her to call therapy back to schedule and let her know they tried to reach her twice. Their number is [phone]. Please also ask her if [facility] neurology has called her yet to schedule. If not, please give her their number [phone] to call to schedule. We referred to [facility] for further management of her Parkinson's.   ?

## 2021-06-14 ENCOUNTER — Other Ambulatory Visit: Payer: Self-pay | Admitting: *Deleted

## 2021-06-14 MED ORDER — CARBIDOPA-LEVODOPA ER 50-200 MG PO TBCR: EXTENDED_RELEASE_TABLET | ORAL | 1 refills | Status: DC

## 2021-08-27 ENCOUNTER — Other Ambulatory Visit: Payer: Self-pay | Admitting: *Deleted

## 2021-08-27 NOTE — Telephone Encounter (Signed)
Received autofax for baclofen, asked pt to return call since received this request.   [facility] [address].  Baclfen '10mg'$  1 tab po qhs #90 and refill 3.   Last seen 04-2021

## 2021-08-30 ENCOUNTER — Other Ambulatory Visit: Payer: Self-pay | Admitting: *Deleted

## 2021-08-30 NOTE — Telephone Encounter (Signed)
Please call patient or husband and find out what the baclofen prescription is for? I had also made a referral to [facility] neurology, have they heard anything yet?  It has been 4 months since the referral was placed.

## 2021-09-09 ENCOUNTER — Other Ambulatory Visit: Payer: Self-pay | Admitting: Neurology

## 2021-10-30 ENCOUNTER — Ambulatory Visit: Payer: Medicare Other | Admitting: Neurology

## 2021-12-09 ENCOUNTER — Other Ambulatory Visit: Payer: Self-pay | Admitting: Neurology

## 2021-12-12 NOTE — Telephone Encounter (Signed)
Please ask patient if she has heard from [facility] about her appointment. If not, can you look into the referral status?

## 2021-12-13 NOTE — Telephone Encounter (Signed)
LMVM for pt to return call ( relating to [facility] appt).

## 2021-12-13 NOTE — Telephone Encounter (Signed)
Tried to call patient. LVM asking for call back.

## 2022-01-06 ENCOUNTER — Other Ambulatory Visit: Payer: Self-pay | Admitting: Neurology

## 2022-01-06 DIAGNOSIS — G20B1 Parkinson's disease with dyskinesia, without mention of fluctuations: Secondary | ICD-10-CM

## 2022-02-06 ENCOUNTER — Other Ambulatory Visit: Payer: Self-pay | Admitting: Neurology

## 2022-04-05 ENCOUNTER — Other Ambulatory Visit: Payer: Self-pay | Admitting: Neurology

## 2022-04-05 DIAGNOSIS — G20B1 Parkinson's disease with dyskinesia, without mention of fluctuations: Secondary | ICD-10-CM

## 2022-04-06 ENCOUNTER — Other Ambulatory Visit: Payer: Self-pay | Admitting: Neurology

## 2022-04-09 ENCOUNTER — Other Ambulatory Visit: Payer: Self-pay | Admitting: Neurology

## 2022-04-09 DIAGNOSIS — G20B1 Parkinson's disease with dyskinesia, without mention of fluctuations: Secondary | ICD-10-CM

## 2022-04-11 MED ORDER — CARBIDOPA-LEVODOPA 25-100 MG PO TABS: ORAL_TABLET | ORAL | 0 refills | Status: DC

## 2022-04-11 MED ORDER — PRAMIPEXOLE DIHYDROCHLORIDE 1 MG PO TABS: 1.0000 mg | ORAL_TABLET | Freq: Three times a day (TID) | ORAL | 0 refills | Status: DC

## 2022-04-11 MED ORDER — AMANTADINE HCL 100 MG PO CAPS: ORAL_CAPSULE | ORAL | 0 refills | Status: DC

## 2022-04-11 NOTE — Addendum Note (Signed)
Addended by: Skeet Simmer A on: 04/11/2022 04:44 PM   Modules accepted: Orders

## 2022-04-11 NOTE — Telephone Encounter (Signed)
Spoke to husband (checked DPR)  husband states not going to drive patient to [facility] neurology husband states too far to drive. Husband  states needing refills on Sinemet IR 25-100,pramipexole and  amantadine. Husband is unaware of times patient takes medication daily . Per Dr Rexene Alberts make patient a sooner appointment so patient can be seen . Pt has not been seen in over a year . Made appointment 05/2022 with Jessica,NP.will sent refill request to Dr.Athar . Husband expressed understanding and thanked me for calling

## 2022-04-11 NOTE — Telephone Encounter (Signed)
LVM for patient to call back to discuss refill request

## 2022-04-11 NOTE — Telephone Encounter (Signed)
Rx signed for C/L IR, pramipexole and amantadine.

## 2022-04-11 NOTE — Addendum Note (Signed)
Addended by: Star Age on: 04/11/2022 04:49 PM   Modules accepted: Orders

## 2022-04-11 NOTE — Telephone Encounter (Signed)
Please call patient or her husband regarding her Sinemet prescriptions.  I would like to get clarification before any renewal of her prescriptions.  How much Sinemet CR 50-200 mg strength is she taking and what times?  How much Sinemet IR 25-100 mg strength is she taking and what times? Please also double check how she is taking her pramipexole and her amantadine.  I had referred her to [facility] neurology, I do not see an appointment scheduled, the referral was done a long time ago, please inquire as to whether she has been contacted by [facility] neuro.  She has not been seen in this office in nearly 1 year.

## 2022-05-09 ENCOUNTER — Telehealth: Payer: Self-pay

## 2022-05-09 NOTE — Telephone Encounter (Signed)
[name] (DOB [birth year]) is scheduled to see me monday - per epic review, she was referred to [facility] neurology for further management of parkinson disease. there is not a reason to have f/u in our office as well as they will take over management.  Can pt be contacted to cancel this appt please? Thanks

## 2022-05-13 ENCOUNTER — Ambulatory Visit (INDEPENDENT_AMBULATORY_CARE_PROVIDER_SITE_OTHER): Payer: Medicare Other | Admitting: Adult Health

## 2022-05-13 ENCOUNTER — Encounter: Payer: Self-pay | Admitting: Adult Health

## 2022-05-13 VITALS — BP 92/58 | HR 81 | Ht 62.0 in | Wt 110.0 lb

## 2022-05-13 DIAGNOSIS — R413 Other amnesia: Secondary | ICD-10-CM | POA: Diagnosis not present

## 2022-05-13 DIAGNOSIS — I959 Hypotension, unspecified: Secondary | ICD-10-CM

## 2022-05-13 DIAGNOSIS — R443 Hallucinations, unspecified: Secondary | ICD-10-CM | POA: Diagnosis not present

## 2022-05-13 DIAGNOSIS — G20B1 Parkinson's disease with dyskinesia, without mention of fluctuations: Secondary | ICD-10-CM | POA: Diagnosis not present

## 2022-05-13 MED ORDER — AMANTADINE HCL 100 MG PO CAPS: 100.0000 mg | ORAL_CAPSULE | Freq: Two times a day (BID) | ORAL | 5 refills | Status: DC

## 2022-05-13 MED ORDER — PRAMIPEXOLE DIHYDROCHLORIDE 1 MG PO TABS: 1.0000 mg | ORAL_TABLET | Freq: Three times a day (TID) | ORAL | 3 refills | Status: DC

## 2022-05-13 MED ORDER — CARBIDOPA-LEVODOPA ER 50-200 MG PO TBCR: 1.0000 | EXTENDED_RELEASE_TABLET | Freq: Every day | ORAL | 5 refills | Status: DC

## 2022-05-13 NOTE — Progress Notes (Signed)
[facility] [address] [place]. [place] [address] [phone]       OFFICE FOLLOW UP NOTE  [name] Date of Birth:  Feb 29, 1946 Medical Record Number:  [id number]    Primary neurologist: Dr. Rexene Alberts Reason for visit: Parkinson's disease    SUBJECTIVE:   CHIEF COMPLAINT:  Chief Complaint  Patient presents with   Follow-up    RM 3 with spouse Amy [name]  Pt is well, spouse states she has not seen [facility] as sometimes he is not able to get her out the house. Mentions her cognition has worsen     Brief HPI:   Amy [name] is a 77 y.o. female who is being seen for Parkinson's disease complicated by dyskinesias, memory loss and hypotension. Was previously seen by Dr. Rexene Alberts on 05/01/2021 and was referred to [facility] neurology for further recommendations/management and possible benefit of duopa pump.  Maintained on amantadine 100 mg 3 times daily, Mirapex 1 mg 3 times daily, Sinemet CR 50-200 6 times daily and Sinemet IR 25-100 8 times daily although noted giving Sinemet sporadically.    Interval history:   Accompanied by husband who provides majority of history.  She was not been seen by [facility] neurology as previously recommended as this is too far of a drive and he has difficulty getting her out of the house for appointments. Husband also remains hesitant on her undergoing surgical procedure for duopa pump. Reports cognition has gradually declined since prior visit, hallucinations persist but not overly bothersome to patient. Gait has been stable, no recent falls, at times has freezing and needs assistance with ambulation more so at night. Does not typically use AD at home but does have a cane.  Continue dyskinesias which he notes can worsen after taking Sinemet CR dosage.  Appetite has been good.  Sleeps fairly well but wakes up frequently with nocturia.  As mentioned at prior visits, no set time for PD medications. He typically gives Sinemet IR  every 1-2 hours but can vary, will give Sinemet CR 3-4 times daily (but no set schedule), Mirapex 3 times daily (but no set schedule) and amantadine as needed throughout the day usually twice daily (but again no set schedule).        ROS:   14 system review of systems performed and negative with exception of those listed in HPI  PMH:  Past Medical History:  Diagnosis Date   Dyspnea on exertion    Elevated blood sugar    Hair loss    Hashimoto's thyroiditis    Hypothyroidism    Impaired fasting blood sugar    Osteoporosis    Palpitations    Parkinson's disease    Parkinsonism    Restless leg syndrome 06/21/2019   Ventricular premature beats    Weakness     PSH:  Past Surgical History:  Procedure Laterality Date   CATARACT EXTRACTION Bilateral    CESAREAN SECTION     x2   CHOLECYSTECTOMY     FOOT SURGERY Right 01/15/2016   WRIST SURGERY  1999    Social History:  Social History   Socioeconomic History   Marital status: Married    Spouse name: Amy [name]   Number of children: 2   Years of education: 12   Highest education level: Not on file  Occupational History   Occupation: Prepares tax returns for [employer]  Tobacco Use   Smoking status: Never   Smokeless tobacco: Never  Vaping Use   Vaping  Use: Never used  Substance and Sexual Activity   Alcohol use: No   Drug use: No   Sexual activity: Not on file  Other Topics Concern   Not on file  Social History Narrative   Lives with husband   Caffeine use: none   Right handed       Pt uses [email or web address] for medication refills.    Phone [phone]    Fax [phone]     Social Determinants of Health   Financial Resource Strain: Not on file  Food Insecurity: Not on file  Transportation Needs: Not on file  Physical Activity: Not on file  Stress: Not on file  Social Connections: Not on file  Intimate Partner Violence: Not on file    Family History:  Family History  Problem Relation Age of Onset    Breast cancer Mother    Lung disease Brother    Parkinson's disease Neg Hx     Medications:   Current Outpatient Medications on File Prior to Visit  Medication Sig Dispense Refill   acetaminophen (TYLENOL) 500 MG tablet Take 2 tablets (1,000 mg total) by mouth every 6 (six) hours as needed. (Patient taking differently: Take 1,000 mg by mouth every 6 (six) hours as needed for mild pain or headache.) 30 tablet 0   alendronate (FOSAMAX) 70 MG tablet Take 70 mg by mouth once a week. Sunday  12   amantadine (SYMMETREL) 100 MG capsule TAKE 1 CAPSULE BY MOUTH EVERY MORNING AT NOON & AT BEDTIME 270 capsule 0   aspirin EC 81 MG tablet Take 1 tablet (81 mg total) by mouth daily. 90 tablet 3   baclofen (LIORESAL) 10 MG tablet Take 1 tablet (10 mg total) by mouth at bedtime. 90 tablet 3   benzocaine (ORAJEL) 10 % mucosal gel Use as directed 1 application in the mouth or throat as needed for mouth pain. 5.3 g 0   carbidopa-levodopa (SINEMET CR) 50-200 MG tablet TAKE 1 TABLET BY MOUTH EVERY 4 HOURS 180 tablet 0   carbidopa-levodopa (SINEMET IR) 25-100 MG tablet Take one pill at 6 AM, 8 AM, 10 AM, 12 (noon), 2 PM, 4 PM, 6 PM AND 8 PM DAILY 720 tablet 0   Carboxymethylcellul-Glycerin 1-0.25 % SOLN Place 1 drop into both eyes daily as needed (dry eyes).     escitalopram (LEXAPRO) 20 MG tablet Take 1 tablet (20 mg total) by mouth daily. 90 tablet 3   famotidine (PEPCID) 20 MG tablet Take 1 tablet (20 mg total) by mouth 2 (two) times daily as needed for heartburn or indigestion. 30 tablet 0   ibuprofen (ADVIL) 800 MG tablet Take 1 tablet (800 mg total) by mouth 3 (three) times daily. 21 tablet 0   pantoprazole (PROTONIX) 40 MG tablet Take 1 tablet (40 mg total) by mouth daily. 30 tablet 11   pramipexole (MIRAPEX) 1 MG tablet Take 1 tablet (1 mg total) by mouth 3 (three) times daily. 270 tablet 0   QUEtiapine (SEROQUEL) 25 MG tablet TAKE 2 TABLETS (50 MG TOTAL) BY MOUTH AT BEDTIME.     SYNTHROID 75 MCG tablet  Take 75 mcg by mouth daily before breakfast.     No current facility-administered medications on file prior to visit.    Allergies:   Allergies  Allergen Reactions   Ciprofloxacin Rash      OBJECTIVE:  Physical Exam  Vitals:   05/13/22 1303  BP: (!) 92/58  Pulse: 81  Weight: 110 lb (49.9 kg)  Height: 5'  2" (1.575 m)   Body mass index is 20.12 kg/m. No results found.   General: Frail very pleasant elderly Caucasian female, seated, in no evident distress Head: head normocephalic and atraumatic.   Neck: supple with no carotid or supraclavicular bruits Cardiovascular: regular rate and rhythm, no murmurs Musculoskeletal: no deformity Skin:  no rash/petichiae Vascular:  Normal pulses all extremities   Neurologic Exam Mental Status: Awake and fully alert. Oriented to place and time. Recent and remote memory mildly impaired. Attention span, concentration and fund of knowledge impaired with husband providing majority of history. Mood and affect appropriate.  Mild facial masking.  Mild to moderate hypophonia.  Unable to appreciate significant dysarthria. Cranial Nerves: Pupils equal, briskly reactive to light. Extraocular movements full without nystagmus. Visual fields full to confrontation. Hearing intact. Facial sensation intact. Face, tongue, palate moves normally and symmetrically.  Motor: Global strength 4/5.  Mild to moderate dyskinesias, mild to moderate slowness, no obvious resting tremor in upper body, no dysmetria or intention tremor Sensory.: intact to touch , pinprick , position and vibratory sensation.  Gait and Station: Arises from chair without difficulty. Stance is slightly hunched. Gait demonstrates slow cautious steps with imbalance without use of AD.  Tandem walk and heel toe not attempted Reflexes: 1+ and symmetric. Toes downgoing.         ASSESSMENT/PLAN: Tunja Ivins is a 77 y.o. year old female with Parkinson's disease complicated by dyskinesias,  hallucinations, hypotension, constipation and progressive memory loss.     Prolonged discussion regarding medication management and importance of maintaining a set schedule for medication adherence.  Further discussed with Dr. Rexene Alberts.  Recommend continued Sinemet IR 25-100 1 tab 8 times daily (starting at 6am and taking every 2 hrs) and will reduce Sinemet CR 50-200 1 tab dosage to take only bedtime (around 10pm) as currently getting sporadically 3-4 times throughout the day but has noted worsening dyskinesias after taking. Will continue Mirapex 1 mg 3 times daily, again discussed keeping on schedule around 6 AM, 12 PM and 6 PM, can consider gradually decreasing at f/u visit. Continue amantadine but advised to take at set schedule around 8am and 8pm (currently taking as needed 1-3x daily).  Could consider trialing Exelon at follow-up visit in hopes of slowing memory decline but would like to see current medications being given on a set schedule as this could also help with cognition and hallucinations prior to adding additional medication to already extensive regimen.      Follow up in 4 months or call earlier if needed   CC:  PCP: Wenda Low, MD    I spent 37 minutes of face-to-face and non-face-to-face time with patient and husband.  This included previsit chart review, lab review, study review, order entry, electronic health record documentation, patient and husband education and discussion regarding the above and answered all other questions to patient and husband satisfaction   Frann Rider, AGNP-BC  [facility] [address] [place],  [address]  Phone [phone] Fax [phone] Note: This document was prepared with digital dictation and possible smart phrase technology. Any transcriptional errors that result from this process are unintentional.

## 2022-05-13 NOTE — Patient Instructions (Addendum)
Your Plan:  Continue Sinemet IR (immediate release) every 2 hours throughout the day - please try to give on a set schedule such as 6 AM, 8 AM, 10 AM, 12, 2 PM, 4 PM, 6 PM and 8 PM - please do not give sooner than at least 2 hours apart and avoid taking with meals  Continue Sinemet CR (controlled release) but will reduce to once daily at bedtime (at least 2 hours after last immediate release dosage)  Continue Mirapex 3 times daily for now (6am, 12am and 6pm)  Continue amantadine '100mg'$  twice daily (8am and 8pm)     Follow up with Dr. Rexene Alberts in 4 months or call earlier if needed     Thank you for coming to see [place] at [facility]. I hope we have been able to provide you high quality care today.  You may receive a patient satisfaction survey over the next few weeks. We would appreciate your feedback and comments so that we may continue to improve ourselves and the health of our patients.

## 2022-07-10 ENCOUNTER — Other Ambulatory Visit: Payer: Self-pay | Admitting: Neurology

## 2022-07-28 ENCOUNTER — Other Ambulatory Visit: Payer: Self-pay | Admitting: Neurology

## 2022-09-16 ENCOUNTER — Ambulatory Visit (INDEPENDENT_AMBULATORY_CARE_PROVIDER_SITE_OTHER): Payer: Medicare Other | Admitting: Neurology

## 2022-09-16 ENCOUNTER — Encounter: Payer: Self-pay | Admitting: Neurology

## 2022-09-16 VITALS — BP 91/63 | HR 73 | Ht 62.0 in | Wt 103.0 lb

## 2022-09-16 DIAGNOSIS — R443 Hallucinations, unspecified: Secondary | ICD-10-CM

## 2022-09-16 DIAGNOSIS — R413 Other amnesia: Secondary | ICD-10-CM

## 2022-09-16 DIAGNOSIS — Z9181 History of falling: Secondary | ICD-10-CM

## 2022-09-16 DIAGNOSIS — G20B1 Parkinson's disease with dyskinesia, without mention of fluctuations: Secondary | ICD-10-CM | POA: Diagnosis not present

## 2022-09-16 MED ORDER — CARBIDOPA-LEVODOPA 25-100 MG PO TABS: ORAL_TABLET | ORAL | 3 refills | Status: DC

## 2022-09-16 MED ORDER — NUPLAZID 10 MG PO TABS
10.0000 mg | ORAL_TABLET | Freq: Every day | ORAL | 3 refills | Status: DC
Start: 2022-09-16 — End: 2022-12-30

## 2022-09-16 NOTE — Patient Instructions (Addendum)
This is what we discussed today:   We will stop the long acting Levodopa.  We will increase the short active levodopa to 1 pill 9 times a day, at 6 AM, 8 AM, 10 AM, 12 PM, 2 PM, 4 PM, 6 PM, 8 PM AND 10 PM. Do not take more than prescribed.  We will refer you back to neuro rehab physical therapy.  Please use your walker at all times.  We will start you on Nuplazid for your Parkinson's related hallucinations. Please remember, that elderly patients with dementia related psychosis on treatment with an anti-psychotic medication are, generally speaking, at an increased risk of death. Nevertheless, this medication is usually well tolerated. The most common side effects include (but are not limeted to): Swelling, nausea, confusion, hallucinations, constipation and gait and balance problems. You cannot take this medication if you have a heart condition called prolonged QT, or if you take an antiarrhythmic heart medication, certain anti-psychotic medications and certain anti-biotics such as ketoconazole. The typical dose for this medication is 34 mg once daily, but we will start you on a lower dose of 10 mg daily. There is no need for titration of this medication and it typically does not need to be adjusted for kidney impairment (when the kidney disease is mild to moderate), but the use of this medication is not recommended in patients with liver function impairment.   Remember, that it may take 6 weeks for the medication to work. If you have any serious side effects, of course, you have to stop it immediately and notify us. Call 911 if you have a serious reaction, such as swelling, hives, breathing difficulty.

## 2022-09-16 NOTE — Progress Notes (Signed)
Subjective:    Patient ID: Amy [name] is a 77 y.o. female.  HPI    Interim history:   Amy [name] is a 77 year old right-handed woman with an underlying medical history of hypothyroidism, Hashimoto's thyroiditis, osteoporosis, palpitations, restless leg syndrome, PVCs, and impaired fasting glucose, who presents for follow-up consultation of her parkinsonism, complicated by dyskinesias, memory loss, constipation, and hypotension. The patient is accompanied by her husband and her son today. I last saw her on 05/01/2021, at which time she reports not feeling so well, was anxious and felt restless. They were advised to continue with Sinemet CR 6 times a day at 6 AM, 9 AM, 12, 3 PM, 6 PM and 9 PM daily and Sinemet IR 25-100 mg strength at 6 AM, 8 AM, 10 AM, 12, 2 PM, 4 PM, 6 PM and 8 PM daily for total of 8 pills daily. I referred her to physical therapy outpatient. We also discussed referral to [facility] for further management of her advanced parkinsonism, with consideration for Duopa pump.   She saw Amy Austin, NP on 05/13/2022, at which time she was noted to have more dyskinesias.  She was advised to continue with Sinemet IR 1 tablet 8 times a day and review Sinemet CR 50-200 mg strength to 1 pill at bedtime.  Exelon was discussed.  She was advised to continue with pramipexole 1 mg 3 times daily and amantadine 100 mg twice daily.  [name] reported that she could not travel to [facility] for neurology appointments.    Today, 09/16/2022: She reports very little, history is provided primarily by her husband and supplemented by her son.  She has fallen recently, no obvious injuries, she does not typically use her walker.  She has a rolling walker but often does not use it.  She has not had any recent physical therapy.  Her husband is unable to recall when she last had physical therapy and he does not know when she last saw her primary care physician.  He reports that she no longer takes  Seroquel, has not taken it in months.  He no longer gives her a dose of Sinemet CR as he did not think it was helpful.  She takes Sinemet IR up to 12 pills/day.  I have again discouraged him from giving her more than prescribed due to concern for side effects including hallucinations, low blood pressure, sleepiness, and dyskinesias, increasing her fall risk.  We talked about Nuplazid today.   The patient's allergies, current medications, family history, past medical history, past social history, past surgical history and problem list were reviewed and updated as appropriate.    Previously:     I first met her on 01/22/21 and she previously followed with Dr. Anne Hahn. On 01/22/2021, she was not on a set schedule for her C/L IR and CR. She was taking multiple doses per day of each. Her husband estimated that she took about 10 or 12 pills of the IR and may be up to 8 pills of the CR each day.  He reported that she had intermittent hallucinations and confusion. She was no longer on Seroquel and per husband she had stopped it after trying it for a few days. Her BP was 81/52 at the visit. I suggest we keep her on a set schedule for her medication, with Sinemet ER 50-200 mg strength 1 pill 6 times a day at 6 AM, 9 AM, 12, 3 PM, 6 PM, and 9 PM daily, and Sinemet IR,  25-100 mg strength 1 pill 8 times a day, at 6 AM, 8 AM, 10 AM, 12, 2 PM, 4 PM, 6 PM and 8 PM daily. She was on amantadine 100 mg tid and Mirapex 1 mg tid as well.     08/28/20 (Dr. Anne Hahn): <<Amy [name] is a 77 year old right-handed white female with a history of Parkinson's disease and an associated memory disorder.  The patient is having some issues with gait instability, she may fall on occasion.  She reports a lot of problems with word finding that is becoming an increasing problem for her.  She is sleeping fairly well at night.  She may have some problems with leg cramps during the day and during the nighttime, this may wake her up at night.  She is  getting Sinemet CR 50/200 mg tablets every 4 hours while awake and may take a 25/100 mg tablet between these doses.  The patient has a lot of dyskinesias that apparently worsen in the afternoon.  The patient takes baclofen 5 mg at night for leg cramps.  She returns to the office today for further evaluation.>>   Her Past Medical History Is Significant For: Past Medical History:  Diagnosis Date   Dyspnea on exertion    Elevated blood sugar    Hair loss    Hashimoto's thyroiditis    Hypothyroidism    Impaired fasting blood sugar    Osteoporosis    Palpitations    Parkinson's disease    Parkinsonism    Restless leg syndrome 06/21/2019   Ventricular premature beats    Weakness     Her Past Surgical History Is Significant For: Past Surgical History:  Procedure Laterality Date   CATARACT EXTRACTION Bilateral    CESAREAN SECTION     x2   CHOLECYSTECTOMY     FOOT SURGERY Right 01/15/2016   WRIST SURGERY  1999    Her Family History Is Significant For: Family History  Problem Relation Age of Onset   Breast cancer Mother    Lung disease Brother    Parkinson's disease Neg Hx     Her Social History Is Significant For: Social History   Socioeconomic History   Marital status: Married    Spouse name: Amy [name]   Number of children: 2   Years of education: 12   Highest education level: Not on file  Occupational History   Occupation: Scientist, water quality tax returns for [employer]  Tobacco Use   Smoking status: Never   Smokeless tobacco: Never  Vaping Use   Vaping Use: Never used  Substance and Sexual Activity   Alcohol use: No   Drug use: No   Sexual activity: Not on file  Other Topics Concern   Not on file  Social History Narrative   Lives with husband   Caffeine use: none   Right handed       Pt uses [email or web address] for medication refills.    Phone [phone]    Fax [phone]     Social Determinants of Health   Financial Resource Strain: Not on file  Food Insecurity:  Not on file  Transportation Needs: Not on file  Physical Activity: Not on file  Stress: Not on file  Social Connections: Not on file    Her Allergies Are:  Allergies  Allergen Reactions   Ciprofloxacin Rash  :   Her Current Medications Are:  Outpatient Encounter Medications as of 09/16/2022  Medication Sig   acetaminophen (TYLENOL) 500 MG tablet Take 2 tablets (1,000 mg  total) by mouth every 6 (six) hours as needed. (Patient taking differently: Take 1,000 mg by mouth every 6 (six) hours as needed for mild pain or headache.)   alendronate (FOSAMAX) 70 MG tablet Take 70 mg by mouth once a week. Sunday   amantadine (SYMMETREL) 100 MG capsule TAKE 1 CAPSULE BY MOUTH EVERY MORNING, AT NOON & AT BEDTIME   aspirin EC 81 MG tablet Take 1 tablet (81 mg total) by mouth daily.   baclofen (LIORESAL) 10 MG tablet Take 1 tablet (10 mg total) by mouth at bedtime.   benzocaine (ORAJEL) 10 % mucosal gel Use as directed 1 application in the mouth or throat as needed for mouth pain.   carbidopa-levodopa (SINEMET CR) 50-200 MG tablet Take 1 tablet by mouth at bedtime.   carbidopa-levodopa (SINEMET IR) 25-100 MG tablet TAKE 1 TABLET BY MOUTH 8 TIMES DAILY-6 AM, 8 AM, 10 AM, 12 PM, 2 PM, 4 PM, 6 PM AND 8 PM   Carboxymethylcellul-Glycerin 1-0.25 % SOLN Place 1 drop into both eyes daily as needed (dry eyes).   escitalopram (LEXAPRO) 20 MG tablet Take 1 tablet (20 mg total) by mouth daily.   famotidine (PEPCID) 20 MG tablet Take 1 tablet (20 mg total) by mouth 2 (two) times daily as needed for heartburn or indigestion.   ibuprofen (ADVIL) 800 MG tablet Take 1 tablet (800 mg total) by mouth 3 (three) times daily.   pantoprazole (PROTONIX) 40 MG tablet Take 1 tablet (40 mg total) by mouth daily.   pramipexole (MIRAPEX) 1 MG tablet Take 1 tablet (1 mg total) by mouth 3 (three) times daily.   QUEtiapine (SEROQUEL) 25 MG tablet TAKE 2 TABLETS (50 MG TOTAL) BY MOUTH AT BEDTIME.   SYNTHROID 75 MCG tablet Take 75 mcg by  mouth daily before breakfast.   No facility-administered encounter medications on file as of 09/16/2022.  :  Review of Systems:  Out of a complete 14 point review of systems, all are reviewed and negative with the exception of these symptoms as listed below:  Review of Systems  Neurological:        RM 5 with spouse Gerlene Burdock and Son  Pt is well, spouse states she is about the same as last visit. She has a lot of hallucinations and easily distracted.     Objective:  Neurological Exam  Physical Exam Physical Examination:   Vitals:   09/16/22 1258  BP: 91/63  Pulse: 73    General Examination: The patient is a very pleasant 77 y.o. female in no acute distress. She appears frail and deconditioned,   adequately groomed.    HEENT: Normocephalic, atraumatic, pupils are equal, round and reactive to light, extraocular tracking is mildly impaired, hearing is grossly intact. She has mild facial masking, mild nuchal rigidity, no carotid bruits.  Airway examination reveals mild mouth dryness.  Tongue protrudes centrally and palate elevates symmetrically.  Mild hypophonia, speech is scant.  No significant dysarthria noted.     Chest: Clear to auscultation without wheezing, rhonchi or crackles noted.   Heart: S1+S2+0, regular and normal without murmurs, rubs or gallops noted.    Abdomen: Soft, non-tender and non-distended.   Extremities: There is no pitting edema in the distal lower extremities bilaterally.    Skin: Warm and dry without trophic changes noted.    Musculoskeletal: exam reveals no obvious joint deformities.    Neurologically:  Mental status: The patient is awake, alert and oriented in all 4 spheres. Her immediate and remote memory,  attention, language skills and fund of knowledge are impaired, she is not giving a whole lot of history.   Cranial nerves II - XII are as described above under HEENT exam.  Motor exam: Thin bulk, global strength of 4 out of 5, Romberg is not tested  for safety concerns, no significant dyskinesias, mild to moderate slowness, no obvious resting tremor in the upper body, she has a resting tremor in both feet but also restlessness in both feet with akathisia and difficulty sitting still, not quite like dyskinesias.   Fine motor skills are globally mild to moderately impaired, no telltale lateralization noted.   Cerebellar testing: No dysmetria or intention tremor.  Sensory exam: intact to light touch in the upper and lower extremities.  Gait, station and balance: She stands with mild difficulty, she requires some assistance.  Posture is moderately stooped and she has mild dystonic posturing while walking, did not bring any walking aid, is unstable while walking and requires assistance, balance is impaired.     Assessment and Plan:    In summary, Geneive Kawahara is a very pleasant 77 year old female with an underlying medical history of hypothyroidism, Hashimoto's thyroiditis, osteoporosis, palpitations, restless leg syndrome, PVCs, and impaired fasting glucose, who presents for follow-up consultation of her advanced parkinsonism, complicated by dyskinesias, hallucinations, low blood pressure, constipation, and memory loss.  She has a significantly low blood pressure values and ongoing hallucinations, has had restlessness.  I again discussed with the patient's husband in particular not to give her more levodopa than prescribed.  She is at risk of hypotension, hallucinations, sleepiness and dyskinesias, all could increase her fall risk even further.  She is no longer on Seroquel, we discussed Nuplazid, its expectations, limitations and possible common side effects today.  Patient and her family are agreeable to trying it.  I would like for her to try the lower of the 2 doses, namely 10 mg once daily.  I placed a prescription and it would likely need prior authorization.  She has not been on Seroquel for months.  He has also stopped giving her the Sinemet  long-acting which I will discontinue today.  He is advised to give her Sinemet IR 25-100 mg strength generic 1 pill 9 times a day starting at 6 AM, every 2 hours, no more than prescribed.  I placed another referral to physical therapy at [facility].  She is advised to use her walker at all times and family is reminded to bring her walker at her appointments as well.  She is advised to follow-up to see the nurse practitioner in about 3 to 4 months, sooner if needed.  I answered all their questions today and the patient and her husband and son were in agreement.   I spent 30 minutes in total face-to-face time and in reviewing records during pre-charting, more than 50% of which was spent in counseling and coordination of care, reviewing test results, reviewing medications and treatment regimen and/or in discussing or reviewing the diagnosis of PD, the prognosis and treatment options. Pertinent laboratory and imaging test results that were available during this visit with the patient were reviewed by me and considered in my medical decision making (see chart for details).

## 2022-09-24 ENCOUNTER — Telehealth: Payer: Self-pay | Admitting: Neurology

## 2022-09-24 NOTE — Telephone Encounter (Signed)
Andage called from [facility]. Stated pt need PA for Pimavanserin Tartrate (NUPLAZID) 10 MG TABS.

## 2022-09-25 ENCOUNTER — Telehealth: Payer: Self-pay

## 2022-09-25 ENCOUNTER — Other Ambulatory Visit (HOSPITAL_COMMUNITY): Payer: Self-pay

## 2022-09-25 NOTE — Telephone Encounter (Signed)
PA request has been Submitted. New Encounter created for follow up. For additional info see Pharmacy Prior Auth telephone encounter from 09/25/2022.

## 2022-09-25 NOTE — Telephone Encounter (Signed)
I spoke with [facility] to notify them of approval. I was told they did have that prescription for Nuplazid and they have been trying to reach the pt to offer copay assistance options. They have been unsuccessful in reaching pt. I also confirmed they have the [phone] number on file as well for husband and the gentleman said he would call the husband. I also attempted to call the husband at both numbers on chart and LVM with office number asking for call back. I also sent a mychart message to the pt's account.

## 2022-09-25 NOTE — Telephone Encounter (Signed)
Pharmacy Patient Advocate Encounter   Received notification from Physician's Office that prior authorization for Nuplazid 10MG  tablets is required/requested.   Insurance verification completed.   The patient is insured through [employer] .   Per test claim: PA submitted to [employer] via CoverMyMeds Key/confirmation #/EOC [facility] Status is pending

## 2022-09-25 NOTE — Telephone Encounter (Signed)
Pharmacy Patient Advocate Encounter  Received notification from [employer] that Prior Authorization for Nuplazid 10MG  tablets has been APPROVED from 08/26/2022 to 09/25/2023.Marland Kitchen  PA #/Case ID/Reference #: [id number]

## 2022-09-26 NOTE — Telephone Encounter (Signed)
Spoke to husband (checked DPR)  gave husband # to [facility]   to discuss co pay assistance options. Husband  thanked me for calling

## 2022-09-26 NOTE — Telephone Encounter (Addendum)
Pt's husband, Trameka Dorough returned phone. Would like a call back.

## 2022-10-21 ENCOUNTER — Other Ambulatory Visit: Payer: Self-pay | Admitting: Adult Health

## 2022-12-23 ENCOUNTER — Encounter (HOSPITAL_COMMUNITY): Payer: Self-pay | Admitting: Emergency Medicine

## 2022-12-23 ENCOUNTER — Emergency Department (HOSPITAL_COMMUNITY): Payer: Medicare Other

## 2022-12-23 ENCOUNTER — Other Ambulatory Visit: Payer: Self-pay

## 2022-12-23 ENCOUNTER — Inpatient Hospital Stay (HOSPITAL_COMMUNITY)
Admission: EM | Admit: 2022-12-23 | Discharge: 2022-12-30 | DRG: 481 | Disposition: A | Discharge status: Skilled Nursing Facility | Payer: Medicare Other | Attending: Obstetrics and Gynecology | Admitting: Obstetrics and Gynecology

## 2022-12-23 DIAGNOSIS — M6259 Muscle wasting and atrophy, not elsewhere classified, multiple sites: Secondary | ICD-10-CM | POA: Diagnosis not present

## 2022-12-23 DIAGNOSIS — D62 Acute posthemorrhagic anemia: Secondary | ICD-10-CM | POA: Diagnosis not present

## 2022-12-23 DIAGNOSIS — G2581 Restless legs syndrome: Secondary | ICD-10-CM | POA: Diagnosis present

## 2022-12-23 DIAGNOSIS — M25551 Pain in right hip: Secondary | ICD-10-CM | POA: Diagnosis not present

## 2022-12-23 DIAGNOSIS — G20A1 Parkinson's disease without dyskinesia, without mention of fluctuations: Secondary | ICD-10-CM | POA: Diagnosis present

## 2022-12-23 DIAGNOSIS — Y92009 Unspecified place in unspecified non-institutional (private) residence as the place of occurrence of the external cause: Secondary | ICD-10-CM

## 2022-12-23 DIAGNOSIS — S199XXA Unspecified injury of neck, initial encounter: Secondary | ICD-10-CM | POA: Diagnosis not present

## 2022-12-23 DIAGNOSIS — D72829 Elevated white blood cell count, unspecified: Secondary | ICD-10-CM

## 2022-12-23 DIAGNOSIS — Z681 Body mass index (BMI) 19 or less, adult: Secondary | ICD-10-CM

## 2022-12-23 DIAGNOSIS — R1312 Dysphagia, oropharyngeal phase: Secondary | ICD-10-CM | POA: Diagnosis not present

## 2022-12-23 DIAGNOSIS — S72001A Fracture of unspecified part of neck of right femur, initial encounter for closed fracture: Secondary | ICD-10-CM | POA: Diagnosis not present

## 2022-12-23 DIAGNOSIS — Z7189 Other specified counseling: Secondary | ICD-10-CM | POA: Diagnosis not present

## 2022-12-23 DIAGNOSIS — Z4789 Encounter for other orthopedic aftercare: Secondary | ICD-10-CM | POA: Diagnosis not present

## 2022-12-23 DIAGNOSIS — I959 Hypotension, unspecified: Secondary | ICD-10-CM | POA: Diagnosis not present

## 2022-12-23 DIAGNOSIS — M810 Age-related osteoporosis without current pathological fracture: Secondary | ICD-10-CM | POA: Diagnosis present

## 2022-12-23 DIAGNOSIS — E063 Autoimmune thyroiditis: Secondary | ICD-10-CM | POA: Diagnosis present

## 2022-12-23 DIAGNOSIS — Z66 Do not resuscitate: Secondary | ICD-10-CM | POA: Diagnosis present

## 2022-12-23 DIAGNOSIS — S299XXA Unspecified injury of thorax, initial encounter: Secondary | ICD-10-CM | POA: Diagnosis not present

## 2022-12-23 DIAGNOSIS — M4802 Spinal stenosis, cervical region: Secondary | ICD-10-CM | POA: Diagnosis not present

## 2022-12-23 DIAGNOSIS — F0282 Dementia in other diseases classified elsewhere, unspecified severity, with psychotic disturbance: Secondary | ICD-10-CM | POA: Diagnosis present

## 2022-12-23 DIAGNOSIS — Z881 Allergy status to other antibiotic agents status: Secondary | ICD-10-CM | POA: Diagnosis not present

## 2022-12-23 DIAGNOSIS — R609 Edema, unspecified: Secondary | ICD-10-CM | POA: Diagnosis not present

## 2022-12-23 DIAGNOSIS — N390 Urinary tract infection, site not specified: Secondary | ICD-10-CM | POA: Diagnosis present

## 2022-12-23 DIAGNOSIS — S72141A Displaced intertrochanteric fracture of right femur, initial encounter for closed fracture: Secondary | ICD-10-CM | POA: Diagnosis not present

## 2022-12-23 DIAGNOSIS — Z7401 Bed confinement status: Secondary | ICD-10-CM | POA: Diagnosis not present

## 2022-12-23 DIAGNOSIS — W19XXXA Unspecified fall, initial encounter: Secondary | ICD-10-CM | POA: Diagnosis not present

## 2022-12-23 DIAGNOSIS — Z79890 Hormone replacement therapy (postmenopausal): Secondary | ICD-10-CM

## 2022-12-23 DIAGNOSIS — F0284 Dementia in other diseases classified elsewhere, unspecified severity, with anxiety: Secondary | ICD-10-CM | POA: Diagnosis present

## 2022-12-23 DIAGNOSIS — R2689 Other abnormalities of gait and mobility: Secondary | ICD-10-CM | POA: Diagnosis not present

## 2022-12-23 DIAGNOSIS — R41841 Cognitive communication deficit: Secondary | ICD-10-CM | POA: Diagnosis not present

## 2022-12-23 DIAGNOSIS — E440 Moderate protein-calorie malnutrition: Secondary | ICD-10-CM | POA: Diagnosis present

## 2022-12-23 DIAGNOSIS — S72001D Fracture of unspecified part of neck of right femur, subsequent encounter for closed fracture with routine healing: Secondary | ICD-10-CM | POA: Diagnosis not present

## 2022-12-23 DIAGNOSIS — Z79899 Other long term (current) drug therapy: Secondary | ICD-10-CM | POA: Diagnosis not present

## 2022-12-23 DIAGNOSIS — S72001S Fracture of unspecified part of neck of right femur, sequela: Secondary | ICD-10-CM | POA: Diagnosis not present

## 2022-12-23 DIAGNOSIS — S72009A Fracture of unspecified part of neck of unspecified femur, initial encounter for closed fracture: Secondary | ICD-10-CM | POA: Diagnosis present

## 2022-12-23 DIAGNOSIS — E860 Dehydration: Secondary | ICD-10-CM | POA: Diagnosis present

## 2022-12-23 DIAGNOSIS — R2681 Unsteadiness on feet: Secondary | ICD-10-CM | POA: Diagnosis not present

## 2022-12-23 DIAGNOSIS — Z9181 History of falling: Secondary | ICD-10-CM | POA: Diagnosis not present

## 2022-12-23 DIAGNOSIS — Z741 Need for assistance with personal care: Secondary | ICD-10-CM | POA: Diagnosis not present

## 2022-12-23 DIAGNOSIS — E039 Hypothyroidism, unspecified: Secondary | ICD-10-CM

## 2022-12-23 DIAGNOSIS — Z5320 Procedure and treatment not carried out because of patient's decision for unspecified reasons: Secondary | ICD-10-CM | POA: Diagnosis not present

## 2022-12-23 DIAGNOSIS — M47812 Spondylosis without myelopathy or radiculopathy, cervical region: Secondary | ICD-10-CM | POA: Diagnosis not present

## 2022-12-23 DIAGNOSIS — R531 Weakness: Secondary | ICD-10-CM | POA: Diagnosis not present

## 2022-12-23 DIAGNOSIS — M4801 Spinal stenosis, occipito-atlanto-axial region: Secondary | ICD-10-CM | POA: Diagnosis not present

## 2022-12-23 DIAGNOSIS — Z515 Encounter for palliative care: Secondary | ICD-10-CM | POA: Diagnosis not present

## 2022-12-23 DIAGNOSIS — M79606 Pain in leg, unspecified: Secondary | ICD-10-CM | POA: Diagnosis not present

## 2022-12-23 DIAGNOSIS — F0390 Unspecified dementia without behavioral disturbance: Secondary | ICD-10-CM | POA: Diagnosis not present

## 2022-12-23 DIAGNOSIS — I6782 Cerebral ischemia: Secondary | ICD-10-CM | POA: Diagnosis not present

## 2022-12-23 DIAGNOSIS — S0990XA Unspecified injury of head, initial encounter: Secondary | ICD-10-CM | POA: Diagnosis not present

## 2022-12-23 DIAGNOSIS — G9389 Other specified disorders of brain: Secondary | ICD-10-CM | POA: Diagnosis not present

## 2022-12-23 LAB — CBC WITH DIFFERENTIAL/PLATELET
Abs Immature Granulocytes: 0.2 10*3/uL — ABNORMAL HIGH (ref 0.00–0.07)
Basophils Absolute: 0.1 10*3/uL (ref 0.0–0.1)
Basophils Relative: 0 %
Eosinophils Absolute: 0 10*3/uL (ref 0.0–0.5)
Eosinophils Relative: 0 %
HCT: 37.1 % (ref 36.0–46.0)
Hemoglobin: 11.8 g/dL — ABNORMAL LOW (ref 12.0–15.0)
Immature Granulocytes: 1 %
Lymphocytes Relative: 7 %
Lymphs Abs: 1.4 10*3/uL (ref 0.7–4.0)
MCH: 28.2 pg (ref 26.0–34.0)
MCHC: 31.8 g/dL (ref 30.0–36.0)
MCV: 88.8 fL (ref 80.0–100.0)
Monocytes Absolute: 1.1 10*3/uL — ABNORMAL HIGH (ref 0.1–1.0)
Monocytes Relative: 6 %
Neutro Abs: 16.4 10*3/uL — ABNORMAL HIGH (ref 1.7–7.7)
Neutrophils Relative %: 86 %
Platelets: 271 10*3/uL (ref 150–400)
RBC: 4.18 MIL/uL (ref 3.87–5.11)
RDW: 14.4 % (ref 11.5–15.5)
WBC: 19.2 10*3/uL — ABNORMAL HIGH (ref 4.0–10.5)
nRBC: 0 % (ref 0.0–0.2)

## 2022-12-23 LAB — COMPREHENSIVE METABOLIC PANEL
ALT: 5 U/L (ref 0–44)
AST: 18 U/L (ref 15–41)
Albumin: 3.8 g/dL (ref 3.5–5.0)
Alkaline Phosphatase: 107 U/L (ref 38–126)
Anion gap: 10 (ref 5–15)
BUN: 29 mg/dL — ABNORMAL HIGH (ref 8–23)
CO2: 25 mmol/L (ref 22–32)
Calcium: 9.4 mg/dL (ref 8.9–10.3)
Chloride: 106 mmol/L (ref 98–111)
Creatinine, Ser: 0.66 mg/dL (ref 0.44–1.00)
GFR, Estimated: >60 mL/min (ref >60)
Glucose, Bld: 128 mg/dL — ABNORMAL HIGH (ref 70–99)
Potassium: 4.6 mmol/L (ref 3.5–5.1)
Sodium: 141 mmol/L (ref 135–145)
Total Bilirubin: 0.6 mg/dL (ref 0.3–1.2)
Total Protein: 7 g/dL (ref 6.5–8.1)

## 2022-12-23 MED ORDER — PRAMIPEXOLE DIHYDROCHLORIDE 1 MG PO TABS
1.0000 mg | ORAL_TABLET | Freq: Three times a day (TID) | ORAL | Status: DC
  Administered 2022-12-24 – 2022-12-30 (×16): 1 mg via ORAL
  Filled 2022-12-23 (×19): qty 1

## 2022-12-23 MED ORDER — FENTANYL CITRATE PF 50 MCG/ML IJ SOSY
25.0000 ug | PREFILLED_SYRINGE | INTRAMUSCULAR | Status: AC | PRN
  Administered 2022-12-23 (×3): 25 ug via INTRAVENOUS
  Filled 2022-12-23 (×3): qty 1

## 2022-12-23 MED ORDER — AMANTADINE HCL 100 MG PO CAPS
100.0000 mg | ORAL_CAPSULE | Freq: Three times a day (TID) | ORAL | Status: DC
  Administered 2022-12-24 – 2022-12-30 (×13): 100 mg via ORAL
  Filled 2022-12-23 (×22): qty 1

## 2022-12-23 MED ORDER — CARBIDOPA-LEVODOPA 25-100 MG PO TABS: 1.0000 | ORAL_TABLET | ORAL | Status: DC

## 2022-12-23 MED ORDER — CARBIDOPA-LEVODOPA 25-100 MG PO TABS
1.0000 | ORAL_TABLET | ORAL | Status: DC
  Administered 2022-12-24 – 2022-12-30 (×39): 1 via ORAL
  Filled 2022-12-23 (×40): qty 1

## 2022-12-23 NOTE — ED Notes (Signed)
Pt to and from CT with RN escort.

## 2022-12-23 NOTE — ED Notes (Signed)
ED TO INPATIENT HANDOFF REPORT  ED Nurse Name and Phone #: Randall Hiss [phone]  S Name/Age/Gender Amy [name] 77 y.o. female Room/Bed: 026C/026C  Code Status   Code Status: Not on file  Home/SNF/Other Home Patient oriented to: self Is this baseline? Yes   Triage Complete: Triage complete  Chief Complaint Hip fracture (HCC) [S72.009A]  Triage Note No notes on file   Allergies Allergies  Allergen Reactions   Ciprofloxacin Rash    Level of Care/Admitting Diagnosis ED Disposition     ED Disposition  Admit   Condition  --   Comment  Hospital Area: [facility] [[id number]]  Level of Care: Med-Surg [16]  May admit patient to [facility] or [facility] if equivalent level of care is available:: Yes  Covid Evaluation: Asymptomatic - no recent exposure (last 10 days) testing not required  Diagnosis: Hip fracture [facility]) [[id number]]  Admitting Physician: John Giovanni [[id number]]  Attending Physician: John Giovanni [[id number]]  Certification:: I certify this patient will need inpatient services for at least 2 midnights  Expected Medical Readiness: 12/26/2022          B Medical/Surgery History Past Medical History:  Diagnosis Date   Dyspnea on exertion    Elevated blood sugar    Hair loss    Hashimoto's thyroiditis    Hypothyroidism    Impaired fasting blood sugar    Osteoporosis    Palpitations    Parkinson's disease (HCC)    Parkinsonism (HCC)    Restless leg syndrome 06/21/2019   Ventricular premature beats    Weakness    Past Surgical History:  Procedure Laterality Date   CATARACT EXTRACTION Bilateral    CESAREAN SECTION     x2   CHOLECYSTECTOMY     FOOT SURGERY Right 01/15/2016   WRIST SURGERY  1999     A IV Location/Drains/Wounds Patient Lines/Drains/Airways Status     Active Line/Drains/Airways     Name Placement date Placement time Site Days   Peripheral IV 12/23/22 20 G Anterior;Left Forearm 12/23/22  2015   Forearm  less than 1            Intake/Output Last 24 hours No intake or output data in the 24 hours ending 12/23/22 2341  Labs/Imaging Results for orders placed or performed during the hospital encounter of 12/23/22 (from the past 48 hour(s))  Comprehensive metabolic panel     Status: Abnormal   Collection Time: 12/23/22  7:50 PM  Result Value Ref Range   Sodium 141 135 - 145 mmol/L   Potassium 4.6 3.5 - 5.1 mmol/L   Chloride 106 98 - 111 mmol/L   CO2 25 22 - 32 mmol/L   Glucose, Bld 128 (H) 70 - 99 mg/dL    Comment: Glucose reference range applies only to samples taken after fasting for at least 8 hours.   BUN 29 (H) 8 - 23 mg/dL   Creatinine, Ser 6.29 0.44 - 1.00 mg/dL   Calcium 9.4 8.9 - 52.8 mg/dL   Total Protein 7.0 6.5 - 8.1 g/dL   Albumin 3.8 3.5 - 5.0 g/dL   AST 18 15 - 41 U/L   ALT 5 0 - 44 U/L   Alkaline Phosphatase 107 38 - 126 U/L   Total Bilirubin 0.6 0.3 - 1.2 mg/dL   GFR, Estimated >41 >32 mL/min    Comment: (NOTE) Calculated using the CKD-EPI Creatinine Equation (2021)    Anion gap 10 5 - 15    Comment: Performed at  [facility], [address] [place], [place] [address]  CBC with Differential     Status: Abnormal   Collection Time: 12/23/22  7:50 PM  Result Value Ref Range   WBC 19.2 (H) 4.0 - 10.5 K/uL   RBC 4.18 3.87 - 5.11 MIL/uL   Hemoglobin 11.8 (L) 12.0 - 15.0 g/dL   HCT 60.4 54.0 - 98.1 %   MCV 88.8 80.0 - 100.0 fL   MCH 28.2 26.0 - 34.0 pg   MCHC 31.8 30.0 - 36.0 g/dL   RDW 19.1 47.8 - 29.5 %   Platelets 271 150 - 400 K/uL   nRBC 0.0 0.0 - 0.2 %   Neutrophils Relative % 86 %   Neutro Abs 16.4 (H) 1.7 - 7.7 K/uL   Lymphocytes Relative 7 %   Lymphs Abs 1.4 0.7 - 4.0 K/uL   Monocytes Relative 6 %   Monocytes Absolute 1.1 (H) 0.1 - 1.0 K/uL   Eosinophils Relative 0 %   Eosinophils Absolute 0.0 0.0 - 0.5 K/uL   Basophils Relative 0 %   Basophils Absolute 0.1 0.0 - 0.1 K/uL   Immature Granulocytes 1 %   Abs Immature Granulocytes  0.20 (H) 0.00 - 0.07 K/uL    Comment: Performed at [facility], [address], [place], [place] [address]   CT CERVICAL SPINE WO CONTRAST  Result Date: 12/23/2022 CLINICAL DATA:  Status post trauma. EXAM: CT CERVICAL SPINE WITHOUT CONTRAST TECHNIQUE: Multidetector CT imaging of the cervical spine was performed without intravenous contrast. Multiplanar CT image reconstructions were also generated. RADIATION DOSE REDUCTION: This exam was performed according to the departmental dose-optimization program which includes automated exposure control, adjustment of the mA and/or kV according to patient size and/or use of iterative reconstruction technique. COMPARISON:  None Available. FINDINGS: Alignment: Normal. Skull base and vertebrae: No acute fracture. Degenerative changes are seen involving the tip of the dens and the adjacent portion of the anterior arch of C1. Chronic appearing sclerotic changes are noted throughout the C5 vertebral body with a small osseous hemangioma suspected within the C6 vertebral body. Soft tissues and spinal canal: No prevertebral fluid or swelling. No visible canal hematoma. Disc levels: Marked severity endplate sclerosis, anterior osteophyte formation and posterior bony spurring are seen at the levels of C5-C6 and C6-C7. There is marked severity narrowing of the anterior atlantoaxial articulation, with marked severity intervertebral disc space narrowing seen at the levels of C5-C6 and C6-C7. Bilateral marked severity multilevel facet joint hypertrophy is noted. Upper chest: There is moderate severity bilateral apical scarring and/or atelectasis. Other: None. IMPRESSION: 1. Marked severity multilevel degenerative changes, most prominent at the levels of C5-C6 and C6-C7. 2. No acute cervical spine fracture or subluxation. Electronically Signed   By: Aram Candela M.D.   On: 12/23/2022 22:49   CT HEAD WO CONTRAST  Result Date: 12/23/2022 CLINICAL DATA:  Status post  trauma. EXAM: CT HEAD WITHOUT CONTRAST TECHNIQUE: Contiguous axial images were obtained from the base of the skull through the vertex without intravenous contrast. RADIATION DOSE REDUCTION: This exam was performed according to the departmental dose-optimization program which includes automated exposure control, adjustment of the mA and/or kV according to patient size and/or use of iterative reconstruction technique. COMPARISON:  None Available. FINDINGS: Brain: There is mild cerebral atrophy with widening of the extra-axial spaces and ventricular dilatation. There are areas of decreased attenuation within the white matter tracts of the supratentorial brain, consistent with microvascular disease changes. Vascular: No hyperdense  vessel or unexpected calcification. Skull: Normal. Negative for fracture or focal lesion. Sinuses/Orbits: No acute finding. Other: None. IMPRESSION: 1. Generalized cerebral atrophy with chronic white matter small vessel ischemic changes. 2. No acute intracranial abnormality. Electronically Signed   By: Aram Candela M.D.   On: 12/23/2022 22:45   DG Pelvis Portable  Result Date: 12/23/2022 CLINICAL DATA:  Status post trauma. EXAM: PORTABLE PELVIS 1-2 VIEWS COMPARISON:  None Available. FINDINGS: An acute, comminuted fracture is seen extending through the inter trochanteric region of the proximal right femur. There is no evidence of dislocation. No pelvic bone lesions are seen. IMPRESSION: Acute intertrochanteric fracture of the proximal right femur. Electronically Signed   By: Aram Candela M.D.   On: 12/23/2022 22:43   DG FEMUR PORT, MIN 2 VIEWS RIGHT  Result Date: 12/23/2022 CLINICAL DATA:  Status post trauma. EXAM: RIGHT FEMUR PORTABLE 2 VIEW COMPARISON:  None Available. FINDINGS: An acute, comminuted fracture deformity is seen extending through the inter trochanteric region and proximal shaft of the right femur. There is no evidence of dislocation. Soft tissues are  unremarkable. IMPRESSION: Acute fracture of the proximal right femur. Electronically Signed   By: Aram Candela M.D.   On: 12/23/2022 22:42   DG Chest Port 1 View  Result Date: 12/23/2022 CLINICAL DATA:  Status post trauma. EXAM: PORTABLE CHEST 1 VIEW COMPARISON:  Jul 29, 2018 FINDINGS: The heart size and mediastinal contours are within normal limits. The lungs are hyperinflated with mild, diffuse chronic appearing increased lung markings noted. There is no evidence of an acute infiltrate, pleural effusion or pneumothorax. Multilevel degenerative changes seen throughout the thoracic spine. IMPRESSION: Chronic appearing increased lung markings without evidence of acute or active cardiopulmonary disease. Electronically Signed   By: Aram Candela M.D.   On: 12/23/2022 22:42    Pending Labs Unresulted Labs (From admission, onward)    None       Vitals/Pain Today's Vitals   12/23/22 2130 12/23/22 2145 12/23/22 2200 12/23/22 2215  BP: (!) 105/51 94/65 103/76 116/65  Pulse: 93 93 91 90  Resp: (!) 34 18 (!) 22 (!) 27  Temp:      TempSrc:      SpO2: 98% 97% 99% 100%  Weight:      Height:        Isolation Precautions No active isolations  Medications Medications  amantadine (SYMMETREL) capsule 100 mg (has no administration in time range)  pramipexole (MIRAPEX) tablet 1 mg (has no administration in time range)  carbidopa-levodopa (SINEMET IR) 25-100 MG per tablet immediate release 1 tablet (has no administration in time range)  fentaNYL (SUBLIMAZE) injection 25 mcg (25 mcg Intravenous Given 12/23/22 2218)    Mobility non-ambulatory     Focused Assessments ortho   R Recommendations: See Admitting Provider Note  Report given to:   Additional Notes: pt has hx of dementia and parkinsons

## 2022-12-23 NOTE — Progress Notes (Signed)
Orthopedic Tech Progress Note Patient Details:  Liah Morr 06/05/1945 [id number]  L2 Trauma Patient ID: Brynda Peon, female   DOB: [birth year], 77 y.o.   MRN: [id number]  Sherilyn Banker 12/23/2022, 7:56 PM

## 2022-12-23 NOTE — ED Notes (Signed)
C-Collar removed by dr Jeraldine Loots.

## 2022-12-23 NOTE — ED Notes (Signed)
Trauma Response Nurse Documentation   Tevis Conger is a 77 y.o. female arriving to [facility] ED via [facility]  On No antithrombotic. Trauma was activated as a Level 2 by Laural Golden based on the following trauma criteria Stable femur, humerus, or pelvic fracture via any mechanism except GLF.  Patient cleared for CT by Dr. Jeraldine Loots. Pt transported to CT with trauma response nurse present to monitor. RN remained with the patient throughout their absence from the department for clinical observation.   GCS 13.  History   Past Medical History:  Diagnosis Date   Dyspnea on exertion    Elevated blood sugar    Hair loss    Hashimoto's thyroiditis    Hypothyroidism    Impaired fasting blood sugar    Osteoporosis    Palpitations    Parkinson's disease (HCC)    Parkinsonism (HCC)    Restless leg syndrome 06/21/2019   Ventricular premature beats    Weakness      Past Surgical History:  Procedure Laterality Date   CATARACT EXTRACTION Bilateral    CESAREAN SECTION     x2   CHOLECYSTECTOMY     FOOT SURGERY Right 01/15/2016   WRIST SURGERY  1999       Initial Focused Assessment (If applicable, or please see trauma documentation): Airway-- intact, no visible obstruction Breathing-- spontaneous, unlabored Circulation-- no obvious bleeding noted, distal pulses intact in right lower extremity  CT's Completed:   CT Head and CT C-Spine   Interventions:  See event summary  Plan for disposition:  Admission to floor   Consults completed:  Orthopaedic Surgeon at 2208.  Event Summary: Patient brought in by [facility]. Per family, they heard noise from patients room, found her on floor up against wall. Obvious deformity noted to right lower extremity. Patient transferred from EMS stretcher to hospital stretcher. Trauma labs obtained. Xray chest, pelvis, right femur completed. Patient to CT with primary RN. CT head, c-spine completed. Patient back to exam room.    MTP Summary (If applicable):  N/A  Bedside handoff with ED RN Juliette Alcide.    Leota Sauers  Trauma Response RN  Please call TRN at [phone] for further assistance.

## 2022-12-23 NOTE — ED Provider Notes (Signed)
[place] EMERGENCY DEPARTMENT AT [facility] Provider Note   CSN: [id number] Arrival date & time: 12/23/22  1954     History  Chief Complaint  Patient presents with   Marletta Lor    Amy [name] is a 77 y.o. female.  HPI Patient presents as a level 2 trauma.  Dementia, Parkinson's are prohibitive.  History is obtained by EMS.  Patient had an unwitnessed fall at her house.  Family reports she was found on the ground.  EMS reports the patient is hemodynamically unremarkable, head tremor, seemed to indicate pain in her right hip otherwise unclear.  Level 5 caveat.     Home Medications Prior to Admission medications   Medication Sig Start Date End Date Taking? Authorizing Provider  amantadine (SYMMETREL) 100 MG capsule TAKE 1 CAPSULE BY MOUTH EVERY MORNING, AT NOON & AT BEDTIME 07/11/22  Yes McCue, Shanda Bumps, NP  carbidopa-levodopa (SINEMET IR) 25-100 MG tablet TAKE 1 TABLET BY MOUTH 8 TIMES DAILY-6 AM, 8 AM, 10 AM, 12 PM, 2 PM, 4 PM, 6 PM AND 8 PM Patient taking differently: Take 1 tablet by mouth See admin instructions. Take 1 tablet by mouth every other hour or 1.5 hours 10/21/22  Yes Huston Foley, MD  escitalopram (LEXAPRO) 20 MG tablet Take 1 tablet (20 mg total) by mouth daily. 03/16/20  Yes York Spaniel, MD  pramipexole (MIRAPEX) 1 MG tablet Take 1 tablet (1 mg total) by mouth 3 (three) times daily. 05/13/22  Yes Ihor Austin, NP  SYNTHROID 75 MCG tablet Take 75 mcg by mouth daily before breakfast. 11/16/18  Yes [provider]  alendronate (FOSAMAX) 70 MG tablet Take 70 mg by mouth once a week. Sunday Patient not taking: Reported on 12/23/2022 01/05/18   [provider]  Pimavanserin Tartrate (NUPLAZID) 10 MG TABS Take 10 mg by mouth daily. Patient not taking: Reported on 12/23/2022 09/16/22   Huston Foley, MD      Allergies    Ciprofloxacin    Review of Systems   Review of Systems  Physical Exam Updated Vital Signs BP 116/65   Pulse 90   Temp (!)  97 F (36.1 C) (Axillary)   Resp (!) 27   Ht 5\' 2"  (1.575 m)   Wt 47 kg   SpO2 100%   BMI 18.95 kg/m  Physical Exam Vitals and nursing note reviewed.  Constitutional:      Appearance: She is well-developed. She is ill-appearing.     Comments: Frail elderly appearing tremulous female moaning  HENT:     Head: Normocephalic and atraumatic.  Eyes:     Conjunctiva/sclera: Conjunctivae normal.  Neck:     Comments: Cervical collar in place Cardiovascular:     Rate and Rhythm: Normal rate and regular rhythm.  Pulmonary:     Effort: Pulmonary effort is normal. No respiratory distress.     Breath sounds: Normal breath sounds. No stridor.  Abdominal:     General: There is no distension.  Skin:    General: Skin is warm and dry.  Neurological:     Mental Status: She is alert.     Motor: Tremor present.     Comments: Patient response to pain and stimuli, seems to indicate discomfort in her pelvis otherwise limited neuroexam  Psychiatric:        Mood and Affect: Mood is anxious.        Cognition and Memory: Cognition is impaired. Memory is impaired.     ED Results / Procedures /  Treatments   Labs (all labs ordered are listed, but only abnormal results are displayed) Labs Reviewed  COMPREHENSIVE METABOLIC PANEL - Abnormal; Notable for the following components:      Result Value   Glucose, Bld 128 (*)    BUN 29 (*)    All other components within normal limits  CBC WITH DIFFERENTIAL/PLATELET - Abnormal; Notable for the following components:   WBC 19.2 (*)    Hemoglobin 11.8 (*)    Neutro Abs 16.4 (*)    Monocytes Absolute 1.1 (*)    Abs Immature Granulocytes 0.20 (*)    All other components within normal limits    EKG None  Radiology CT CERVICAL SPINE WO CONTRAST  Result Date: 12/23/2022 CLINICAL DATA:  Status post trauma. EXAM: CT CERVICAL SPINE WITHOUT CONTRAST TECHNIQUE: Multidetector CT imaging of the cervical spine was performed without intravenous contrast.  Multiplanar CT image reconstructions were also generated. RADIATION DOSE REDUCTION: This exam was performed according to the departmental dose-optimization program which includes automated exposure control, adjustment of the mA and/or kV according to patient size and/or use of iterative reconstruction technique. COMPARISON:  None Available. FINDINGS: Alignment: Normal. Skull base and vertebrae: No acute fracture. Degenerative changes are seen involving the tip of the dens and the adjacent portion of the anterior arch of C1. Chronic appearing sclerotic changes are noted throughout the C5 vertebral body with a small osseous hemangioma suspected within the C6 vertebral body. Soft tissues and spinal canal: No prevertebral fluid or swelling. No visible canal hematoma. Disc levels: Marked severity endplate sclerosis, anterior osteophyte formation and posterior bony spurring are seen at the levels of C5-C6 and C6-C7. There is marked severity narrowing of the anterior atlantoaxial articulation, with marked severity intervertebral disc space narrowing seen at the levels of C5-C6 and C6-C7. Bilateral marked severity multilevel facet joint hypertrophy is noted. Upper chest: There is moderate severity bilateral apical scarring and/or atelectasis. Other: None. IMPRESSION: 1. Marked severity multilevel degenerative changes, most prominent at the levels of C5-C6 and C6-C7. 2. No acute cervical spine fracture or subluxation. Electronically Signed   By: Aram Candela M.D.   On: 12/23/2022 22:49   CT HEAD WO CONTRAST  Result Date: 12/23/2022 CLINICAL DATA:  Status post trauma. EXAM: CT HEAD WITHOUT CONTRAST TECHNIQUE: Contiguous axial images were obtained from the base of the skull through the vertex without intravenous contrast. RADIATION DOSE REDUCTION: This exam was performed according to the departmental dose-optimization program which includes automated exposure control, adjustment of the mA and/or kV according to  patient size and/or use of iterative reconstruction technique. COMPARISON:  None Available. FINDINGS: Brain: There is mild cerebral atrophy with widening of the extra-axial spaces and ventricular dilatation. There are areas of decreased attenuation within the white matter tracts of the supratentorial brain, consistent with microvascular disease changes. Vascular: No hyperdense vessel or unexpected calcification. Skull: Normal. Negative for fracture or focal lesion. Sinuses/Orbits: No acute finding. Other: None. IMPRESSION: 1. Generalized cerebral atrophy with chronic white matter small vessel ischemic changes. 2. No acute intracranial abnormality. Electronically Signed   By: Aram Candela M.D.   On: 12/23/2022 22:45   DG Pelvis Portable  Result Date: 12/23/2022 CLINICAL DATA:  Status post trauma. EXAM: PORTABLE PELVIS 1-2 VIEWS COMPARISON:  None Available. FINDINGS: An acute, comminuted fracture is seen extending through the inter trochanteric region of the proximal right femur. There is no evidence of dislocation. No pelvic bone lesions are seen. IMPRESSION: Acute intertrochanteric fracture of the proximal right femur. Electronically  Signed   By: Aram Candela M.D.   On: 12/23/2022 22:43   DG FEMUR PORT, MIN 2 VIEWS RIGHT  Result Date: 12/23/2022 CLINICAL DATA:  Status post trauma. EXAM: RIGHT FEMUR PORTABLE 2 VIEW COMPARISON:  None Available. FINDINGS: An acute, comminuted fracture deformity is seen extending through the inter trochanteric region and proximal shaft of the right femur. There is no evidence of dislocation. Soft tissues are unremarkable. IMPRESSION: Acute fracture of the proximal right femur. Electronically Signed   By: Aram Candela M.D.   On: 12/23/2022 22:42   DG Chest Port 1 View  Result Date: 12/23/2022 CLINICAL DATA:  Status post trauma. EXAM: PORTABLE CHEST 1 VIEW COMPARISON:  Jul 29, 2018 FINDINGS: The heart size and mediastinal contours are within normal limits.  The lungs are hyperinflated with mild, diffuse chronic appearing increased lung markings noted. There is no evidence of an acute infiltrate, pleural effusion or pneumothorax. Multilevel degenerative changes seen throughout the thoracic spine. IMPRESSION: Chronic appearing increased lung markings without evidence of acute or active cardiopulmonary disease. Electronically Signed   By: Aram Candela M.D.   On: 12/23/2022 22:42    Procedures Procedures    Medications Ordered in ED Medications  amantadine (SYMMETREL) capsule 100 mg (has no administration in time range)  pramipexole (MIRAPEX) tablet 1 mg (has no administration in time range)  carbidopa-levodopa (SINEMET IR) 25-100 MG per tablet immediate release 1 tablet (has no administration in time range)  fentaNYL (SUBLIMAZE) injection 25 mcg (25 mcg Intravenous Given 12/23/22 2218)    ED Course/ Medical Decision Making/ A&P                                 Medical Decision Making Female with Parkinson's and dementia presents after an unwitnessed fall as a level 2 trauma.  With consideration of possible hip pain patient had x-rays, CT, labs, monitoring.   Amount and/or Complexity of Data Reviewed Independent Historian: EMS External Data Reviewed: notes. Labs: ordered. Decision-making details documented in ED Course. Radiology: ordered and independent interpretation performed. Decision-making details documented in ED Course.  Risk Prescription drug management. Decision regarding hospitalization. Diagnosis or treatment significantly limited by social determinants of health.   11:04 PM Patient with ongoing tremor, results now reviewed, discussed with son and husband at bedside.  Head CT, neck CT, both reassuring.  However, the patient does have a right intertrochanteric fracture wide previously discussed with our orthopedic colleague, Dr.Wham.  Orthopedics will follow as a consulting service.  Given concern for hip fracture in the  context of fall in a patient with multiple medical problems she will be admitted to the internal medicine team.        Final Clinical Impression(s) / ED Diagnoses Final diagnoses:  Fall, initial encounter  Closed fracture of right hip, initial encounter [facility])     Gerhard Munch, MD 12/23/22 2305

## 2022-12-23 NOTE — Progress Notes (Signed)
   12/23/22 2010  Spiritual Encounters  Type of Visit Initial  Care provided to: Family  Referral source Trauma page  Reason for visit Trauma   Chaplain responded to a level two trauma. The patient was attended to by the medical team.  I offered support to the family members present. They both said they were doing ok. I advised them is they wished to talk later in the visit to let their nurse know and someone would respond.   Valerie Roys [facility]  [phone]

## 2022-12-24 ENCOUNTER — Inpatient Hospital Stay (HOSPITAL_COMMUNITY): Payer: Medicare Other

## 2022-12-24 ENCOUNTER — Encounter (HOSPITAL_COMMUNITY)
Admission: EM | Disposition: A | Discharge status: Skilled Nursing Facility | Payer: Self-pay | Source: Home / Self Care | Attending: Internal Medicine

## 2022-12-24 ENCOUNTER — Encounter (HOSPITAL_COMMUNITY): Payer: Self-pay | Admitting: Internal Medicine

## 2022-12-24 ENCOUNTER — Inpatient Hospital Stay (HOSPITAL_COMMUNITY): Payer: Medicare Other | Admitting: Anesthesiology

## 2022-12-24 ENCOUNTER — Other Ambulatory Visit: Payer: Self-pay

## 2022-12-24 DIAGNOSIS — S72141A Displaced intertrochanteric fracture of right femur, initial encounter for closed fracture: Secondary | ICD-10-CM | POA: Diagnosis not present

## 2022-12-24 DIAGNOSIS — S72001A Fracture of unspecified part of neck of right femur, initial encounter for closed fracture: Secondary | ICD-10-CM

## 2022-12-24 DIAGNOSIS — S72001D Fracture of unspecified part of neck of right femur, subsequent encounter for closed fracture with routine healing: Secondary | ICD-10-CM

## 2022-12-24 DIAGNOSIS — E039 Hypothyroidism, unspecified: Secondary | ICD-10-CM | POA: Diagnosis not present

## 2022-12-24 DIAGNOSIS — E440 Moderate protein-calorie malnutrition: Secondary | ICD-10-CM | POA: Insufficient documentation

## 2022-12-24 DIAGNOSIS — E860 Dehydration: Secondary | ICD-10-CM

## 2022-12-24 DIAGNOSIS — D72829 Elevated white blood cell count, unspecified: Secondary | ICD-10-CM

## 2022-12-24 HISTORY — PX: INTRAMEDULLARY (IM) NAIL INTERTROCHANTERIC: SHX5875

## 2022-12-24 LAB — IRON AND TIBC
Iron: 29 ug/dL (ref 28–170)
Saturation Ratios: 11 % (ref 10.4–31.8)
TIBC: 256 ug/dL (ref 250–450)
UIBC: 227 ug/dL

## 2022-12-24 LAB — CBC
HCT: 34.4 % — ABNORMAL LOW (ref 36.0–46.0)
Hemoglobin: 11.1 g/dL — ABNORMAL LOW (ref 12.0–15.0)
MCH: 27.9 pg (ref 26.0–34.0)
MCHC: 32.3 g/dL (ref 30.0–36.0)
MCV: 86.4 fL (ref 80.0–100.0)
Platelets: 232 10*3/uL (ref 150–400)
RBC: 3.98 MIL/uL (ref 3.87–5.11)
RDW: 14.4 % (ref 11.5–15.5)
WBC: 15.3 10*3/uL — ABNORMAL HIGH (ref 4.0–10.5)
nRBC: 0 % (ref 0.0–0.2)

## 2022-12-24 LAB — FOLATE: Folate: 15.4 ng/mL (ref >5.9)

## 2022-12-24 LAB — RETICULOCYTES
Immature Retic Fract: 5.6 % (ref 2.3–15.9)
RBC.: 4.03 MIL/uL (ref 3.87–5.11)
Retic Count, Absolute: 33 10*3/uL (ref 19.0–186.0)
Retic Ct Pct: 0.8 % (ref 0.4–3.1)

## 2022-12-24 LAB — FERRITIN: Ferritin: 240 ng/mL (ref 11–307)

## 2022-12-24 LAB — SURGICAL PCR SCREEN
MRSA, PCR: NEGATIVE
Staphylococcus aureus: NEGATIVE

## 2022-12-24 LAB — URINALYSIS, ROUTINE W REFLEX MICROSCOPIC
Bilirubin Urine: NEGATIVE
Glucose, UA: NEGATIVE mg/dL
Ketones, ur: 5 mg/dL — AB
Nitrite: POSITIVE — AB
Protein, ur: NEGATIVE mg/dL
Specific Gravity, Urine: 1.023 (ref 1.005–1.030)
pH: 5 (ref 5.0–8.0)

## 2022-12-24 LAB — TYPE AND SCREEN
ABO/RH(D): O POS
Antibody Screen: NEGATIVE

## 2022-12-24 LAB — VITAMIN B12: Vitamin B-12: 292 pg/mL (ref 180–914)

## 2022-12-24 LAB — TSH: TSH: 4.636 u[IU]/mL — ABNORMAL HIGH (ref 0.350–4.500)

## 2022-12-24 LAB — ABO/RH: ABO/RH(D): O POS

## 2022-12-24 SURGERY — FIXATION, FRACTURE, INTERTROCHANTERIC, WITH INTRAMEDULLARY ROD
Anesthesia: General | Site: Hip | Laterality: Right

## 2022-12-24 MED ORDER — ACETAMINOPHEN 325 MG PO TABS
650.0000 mg | ORAL_TABLET | Freq: Four times a day (QID) | ORAL | Status: DC | PRN
  Administered 2022-12-27 – 2022-12-30 (×5): 650 mg via ORAL
  Filled 2022-12-24 (×6): qty 2

## 2022-12-24 MED ORDER — LIDOCAINE 2% (20 MG/ML) 5 ML SYRINGE
INTRAMUSCULAR | Status: DC | PRN
  Administered 2022-12-24: 60 mg via INTRAVENOUS

## 2022-12-24 MED ORDER — ROCURONIUM BROMIDE 10 MG/ML (PF) SYRINGE
PREFILLED_SYRINGE | INTRAVENOUS | Status: DC | PRN
  Administered 2022-12-24: 20 mg via INTRAVENOUS
  Administered 2022-12-24: 60 mg via INTRAVENOUS

## 2022-12-24 MED ORDER — 0.9 % SODIUM CHLORIDE (POUR BTL) OPTIME
TOPICAL | Status: DC | PRN
  Administered 2022-12-24: 1000 mL

## 2022-12-24 MED ORDER — SUGAMMADEX SODIUM 200 MG/2ML IV SOLN
INTRAVENOUS | Status: DC | PRN
  Administered 2022-12-24: 200 mg via INTRAVENOUS

## 2022-12-24 MED ORDER — SODIUM CHLORIDE 0.9 % IV SOLN: INTRAVENOUS | Status: AC

## 2022-12-24 MED ORDER — MORPHINE SULFATE (PF) 2 MG/ML IV SOLN
1.0000 mg | INTRAVENOUS | Status: DC | PRN
  Administered 2022-12-25 – 2022-12-30 (×6): 1 mg via INTRAVENOUS
  Filled 2022-12-24 (×7): qty 1

## 2022-12-24 MED ORDER — CHLORHEXIDINE GLUCONATE 0.12 % MT SOLN: 15.0000 mL | Freq: Once | OROMUCOSAL | Status: DC

## 2022-12-24 MED ORDER — FENTANYL CITRATE (PF) 250 MCG/5ML IJ SOLN
INTRAMUSCULAR | Status: DC | PRN
  Administered 2022-12-24: 50 ug via INTRAVENOUS

## 2022-12-24 MED ORDER — ALBUMIN HUMAN 5 % IV SOLN
INTRAVENOUS | Status: DC | PRN
Start: 2022-12-24 — End: 2022-12-24

## 2022-12-24 MED ORDER — CEFAZOLIN SODIUM-DEXTROSE 2-3 GM-%(50ML) IV SOLR
INTRAVENOUS | Status: DC | PRN
Start: 2022-12-24 — End: 2022-12-24
  Administered 2022-12-24: 2 g via INTRAVENOUS

## 2022-12-24 MED ORDER — LACTATED RINGERS IV SOLN
INTRAVENOUS | Status: DC | PRN
Start: 2022-12-24 — End: 2022-12-24

## 2022-12-24 MED ORDER — PROPOFOL 10 MG/ML IV BOLUS
INTRAVENOUS | Status: DC | PRN
  Administered 2022-12-24: 100 mg via INTRAVENOUS

## 2022-12-24 MED ORDER — EPHEDRINE SULFATE-NACL 50-0.9 MG/10ML-% IV SOSY
PREFILLED_SYRINGE | INTRAVENOUS | Status: DC | PRN
  Administered 2022-12-24: 10 mg via INTRAVENOUS

## 2022-12-24 MED ORDER — FENTANYL CITRATE (PF) 100 MCG/2ML IJ SOLN
INTRAMUSCULAR | Status: AC
  Filled 2022-12-24: qty 2

## 2022-12-24 MED ORDER — CEFAZOLIN SODIUM-DEXTROSE 1-4 GM/50ML-% IV SOLN
1.0000 g | Freq: Three times a day (TID) | INTRAVENOUS | Status: AC
  Administered 2022-12-25 (×3): 1 g via INTRAVENOUS
  Filled 2022-12-24 (×4): qty 50

## 2022-12-24 MED ORDER — PHENYLEPHRINE 80 MCG/ML (10ML) SYRINGE FOR IV PUSH (FOR BLOOD PRESSURE SUPPORT)
PREFILLED_SYRINGE | INTRAVENOUS | Status: DC | PRN
  Administered 2022-12-24: 80 ug via INTRAVENOUS
  Administered 2022-12-24 (×2): 160 ug via INTRAVENOUS
  Administered 2022-12-24: 200 ug via INTRAVENOUS
  Administered 2022-12-24 (×2): 160 ug via INTRAVENOUS

## 2022-12-24 MED ORDER — LACTATED RINGERS IV SOLN: INTRAVENOUS | Status: DC

## 2022-12-24 MED ORDER — TRANEXAMIC ACID-NACL 1000-0.7 MG/100ML-% IV SOLN
INTRAVENOUS | Status: AC
  Filled 2022-12-24: qty 100

## 2022-12-24 MED ORDER — TRANEXAMIC ACID-NACL 1000-0.7 MG/100ML-% IV SOLN
INTRAVENOUS | Status: DC | PRN
Start: 2022-12-24 — End: 2022-12-24
  Administered 2022-12-24: 1000 mg via INTRAVENOUS

## 2022-12-24 MED ORDER — NALOXONE HCL 0.4 MG/ML IJ SOLN: 0.4000 mg | INTRAMUSCULAR | Status: DC | PRN

## 2022-12-24 MED ORDER — DEXAMETHASONE SODIUM PHOSPHATE 10 MG/ML IJ SOLN
INTRAMUSCULAR | Status: DC | PRN
  Administered 2022-12-24: 4 mg via INTRAVENOUS

## 2022-12-24 MED ORDER — PROPOFOL 10 MG/ML IV BOLUS
INTRAVENOUS | Status: AC
  Filled 2022-12-24: qty 20

## 2022-12-24 MED ORDER — FENTANYL CITRATE (PF) 100 MCG/2ML IJ SOLN
25.0000 ug | INTRAMUSCULAR | Status: DC | PRN
  Administered 2022-12-24: 25 ug via INTRAVENOUS

## 2022-12-24 MED ORDER — ORAL CARE MOUTH RINSE: 15.0000 mL | Freq: Once | OROMUCOSAL | Status: DC

## 2022-12-24 MED ORDER — FENTANYL CITRATE (PF) 250 MCG/5ML IJ SOLN
INTRAMUSCULAR | Status: AC
  Filled 2022-12-24: qty 5

## 2022-12-24 MED ORDER — ONDANSETRON HCL 4 MG/2ML IJ SOLN
INTRAMUSCULAR | Status: DC | PRN
  Administered 2022-12-24: 4 mg via INTRAVENOUS

## 2022-12-24 MED ORDER — LEVOTHYROXINE SODIUM 75 MCG PO TABS
75.0000 ug | ORAL_TABLET | Freq: Every day | ORAL | Status: DC
  Administered 2022-12-25 – 2022-12-30 (×5): 75 ug via ORAL
  Filled 2022-12-24 (×5): qty 1

## 2022-12-24 MED ORDER — OXYCODONE HCL 5 MG PO TABS
5.0000 mg | ORAL_TABLET | Freq: Four times a day (QID) | ORAL | Status: DC | PRN
  Administered 2022-12-27 – 2022-12-30 (×5): 5 mg via ORAL
  Filled 2022-12-24 (×6): qty 1

## 2022-12-24 SURGICAL SUPPLY — 47 items
ALCOHOL 70% 16 OZ (MISCELLANEOUS) ×1 IMPLANT
BAG COUNTER SPONGE SURGICOUNT (BAG) ×1 IMPLANT
BAG SPNG CNTER NS LX DISP (BAG) ×1
BIT DRILL 4.3MMS DISTAL GRDTED (BIT) IMPLANT
BNDG CMPR 5X6 CHSV STRCH STRL (GAUZE/BANDAGES/DRESSINGS) ×1
BNDG COHESIVE 6X5 TAN ST LF (GAUZE/BANDAGES/DRESSINGS) ×2 IMPLANT
CANISTER SUCT 3000ML PPV (MISCELLANEOUS) ×1 IMPLANT
COVER PERINEAL POST (MISCELLANEOUS) ×1 IMPLANT
COVER SURGICAL LIGHT HANDLE (MISCELLANEOUS) ×1 IMPLANT
DRAPE C-ARM 42X72 X-RAY (DRAPES) ×1 IMPLANT
DRAPE HALF SHEET 40X57 (DRAPES) IMPLANT
DRAPE INCISE IOBAN 66X45 STRL (DRAPES) ×1 IMPLANT
DRAPE STERI IOBAN 125X83 (DRAPES) ×1 IMPLANT
DRILL 4.3MMS DISTAL GRADUATED (BIT) ×1
DRSG ADAPTIC 3X8 NADH LF (GAUZE/BANDAGES/DRESSINGS) ×1 IMPLANT
DRSG TEGADERM 4X4.75 (GAUZE/BANDAGES/DRESSINGS) IMPLANT
DRSG XEROFORM 1X8 (GAUZE/BANDAGES/DRESSINGS) IMPLANT
DURAPREP 26ML APPLICATOR (WOUND CARE) ×1 IMPLANT
ELECT CAUTERY BLADE 6.4 (BLADE) ×1 IMPLANT
ELECT REM PT RETURN 9FT ADLT (ELECTROSURGICAL) ×1
ELECTRODE REM PT RTRN 9FT ADLT (ELECTROSURGICAL) ×1 IMPLANT
GAUZE SPONGE 4X4 12PLY STRL (GAUZE/BANDAGES/DRESSINGS) IMPLANT
GAUZE SPONGE 4X4 12PLY STRL LF (GAUZE/BANDAGES/DRESSINGS) ×1 IMPLANT
GLOVE BIO SURGEON STRL SZ7.5 (GLOVE) ×2 IMPLANT
GLOVE BIOGEL PI IND STRL 8 (GLOVE) ×2 IMPLANT
GOWN STRL REUS W/ TWL LRG LVL3 (GOWN DISPOSABLE) ×1 IMPLANT
GOWN STRL REUS W/TWL LRG LVL3 (GOWN DISPOSABLE) ×1
GUIDEPIN VERSANAIL DSP 3.2X444 (ORTHOPEDIC DISPOSABLE SUPPLIES) IMPLANT
GUIDEWIRE BALL NOSE 100CM (WIRE) IMPLANT
HFN RH 130 DEG 9MM X 360MM (Nail) IMPLANT
KIT BASIN OR (CUSTOM PROCEDURE TRAY) ×1 IMPLANT
KIT TURNOVER KIT B (KITS) ×1 IMPLANT
NS IRRIG 1000ML POUR BTL (IV SOLUTION) ×1 IMPLANT
PACK GENERAL/GYN (CUSTOM PROCEDURE TRAY) ×1 IMPLANT
PAD ARMBOARD 7.5X6 YLW CONV (MISCELLANEOUS) ×2 IMPLANT
SCREW BONE CORTICAL 5.0X42 (Screw) IMPLANT
SCREW LAG HIP NAIL 10.5X95 (Screw) IMPLANT
SCREWDRIVER HEX TIP 3.5MM (MISCELLANEOUS) IMPLANT
STAPLER VISISTAT 35W (STAPLE) ×1 IMPLANT
SUT MON AB 2-0 CT1 36 (SUTURE) ×1 IMPLANT
SUT VIC AB 0 CT1 27 (SUTURE) ×1
SUT VIC AB 0 CT1 27XBRD ANBCTR (SUTURE) IMPLANT
SUT VIC AB 2-0 CT1 27 (SUTURE) ×1
SUT VIC AB 2-0 CT1 TAPERPNT 27 (SUTURE) IMPLANT
TOWEL GREEN STERILE (TOWEL DISPOSABLE) ×1 IMPLANT
TOWEL GREEN STERILE FF (TOWEL DISPOSABLE) ×1 IMPLANT
WATER STERILE IRR 1000ML POUR (IV SOLUTION) ×1 IMPLANT

## 2022-12-24 NOTE — Transfer of Care (Signed)
Immediate Anesthesia Transfer of Care Note  Patient: Amy [name]  Procedure(s) Performed: INTRAMEDULLARY (IM) NAIL INTERTROCHANTERIC (Right: Hip)  Patient Location: PACU  Anesthesia Type:General  Level of Consciousness: awake  Airway & Oxygen Therapy: Patient Spontanous Breathing and Patient connected to nasal cannula oxygen  Post-op Assessment: Report given to RN and Post -op Vital signs reviewed and stable  Post vital signs: Reviewed and stable  Last Vitals:  Vitals Value Taken Time  BP 94/67 12/24/22 1730  Temp 36.4 C 12/24/22 1725  Pulse 91 12/24/22 1730  Resp 22 12/24/22 1730  SpO2 93 % 12/24/22 1730  Vitals shown include unfiled device data.  Last Pain:  Vitals:   12/24/22 1517  TempSrc: Axillary         Complications: No notable events documented.

## 2022-12-24 NOTE — Progress Notes (Signed)
[facility] Pre-op Review 1.Surgical orders: Consent orders and signed: no order at this time, pt unable to sign-husband can sign consent per RN but may need to be called as he is not with pt at this time. Is patient alert and oriented to sign consent: no, dementia/confusion Antibiotic: None ordered at this time  2.  Pre-procedure checklist completed: asked RN to complete  3.  NPO: 0001 10/15  4.  CHG Bath completed: asked RN to complete Belongings removed and placed in clean gown: asked RN to complete  5.  Labs Performed: CBC WNL CMP/BMP WNL Type and Screen: 10/14 Surgical PCR: in process at 0927 Pregnancy: postmenopausal EKG: 10/15 pending Chest x-ray: 10/14   6.  Recent H&P or progress note if inpatient: yes  7.  Language Barrier: none  8.  Vital Signs: WNL Oxygen: RA Tele:  Can the patient travel without Tele  9.  Medications: Cardiac Drips: NA Pain Medications: LD 10/14 Beta Blocker: NA Anticoagulants: NA GLP1: NA  Pre-op Medications Ordered:  10. IV access:20G LFA

## 2022-12-24 NOTE — Anesthesia Postprocedure Evaluation (Signed)
Anesthesia Post Note  Patient: Amy [name]  Procedure(s) Performed: INTRAMEDULLARY (IM) NAIL INTERTROCHANTERIC (Right: Hip)     Patient location during evaluation: PACU Anesthesia Type: General Level of consciousness: sedated and patient cooperative Pain management: pain level controlled Vital Signs Assessment: post-procedure vital signs reviewed and stable Respiratory status: spontaneous breathing Cardiovascular status: stable Anesthetic complications: no   No notable events documented.  Last Vitals:  Vitals:   12/24/22 1800 12/24/22 1821  BP: (!) 104/57 113/60  Pulse: 88 96  Resp: 14 16  Temp: 36.5 C 36.5 C  SpO2: 96% 92%    Last Pain:  Vitals:   12/24/22 1821  TempSrc: Axillary                 Lewie Loron

## 2022-12-24 NOTE — H&P (Signed)
History and Physical    Amy [name] GNF:[id number] DOB: [birth year] DOA: 12/23/2022  PCP: Georgann Housekeeper, MD  Patient coming from: Home  Chief Complaint: Fall, right hip pain  HPI: Amy [name] is a 77 y.o. Amy with medical history significant of advanced Parkinson's disease, dementia, Hashimoto's thyroiditis, hypothyroidism, osteoporosis, RLS, PVCs presented to ED via EMS after an unwitnessed fall at home.  Family reported that patient was found on the ground.  She was hemodynamically stable with EMS.  Complained of right hip pain.  Labs notable for WBC 19.2, hemoglobin 11.8 (previously >14 on labs done over 3 years ago), MCV 88.8.  X-ray of hip/pelvis showing acute intertrochanteric fracture of the proximal right femur.  Chest x-ray showing no acute cardiopulmonary disease.  CT head/C-spine negative for acute findings.  EDP discussed the case with Dr. Hulda Humphrey, orthopedics will consult.  [facility] called to admit.  Patient has advanced dementia, nonverbal.  History provided by her husband and son at bedside.  Husband states he stays with the patient 24 hours a day due to her advanced Parkinson's/dementia.  Today he stepped away from her for a few minutes when he heard her fall.  When he arrived, she was on the ground screaming in pain.  Other than the fall today, she has been at baseline.  No fevers, cough, shortness of breath, vomiting, diarrhea, or abdominal pain reported by family.  Review of Systems:  Review of Systems  All other systems reviewed and are negative.   Past Medical History:  Diagnosis Date   Dyspnea on exertion    Elevated blood sugar    Hair loss    Hashimoto's thyroiditis    Hypothyroidism    Impaired fasting blood sugar    Osteoporosis    Palpitations    Parkinson's disease (HCC)    Parkinsonism (HCC)    Restless leg syndrome 06/21/2019   Ventricular premature beats    Weakness     Past Surgical History:  Procedure Laterality Date   CATARACT EXTRACTION Bilateral     CESAREAN SECTION     x2   CHOLECYSTECTOMY     FOOT SURGERY Right 01/15/2016   WRIST SURGERY  1999     reports that she has never smoked. She has never used smokeless tobacco. She reports that she does not drink alcohol and does not use drugs.  Allergies  Allergen Reactions   Ciprofloxacin Rash    Family History  Problem Relation Age of Onset   Breast cancer Mother    Lung disease Brother    Parkinson's disease Neg Hx     Prior to Admission medications   Medication Sig Start Date End Date Taking? Authorizing Provider  amantadine (SYMMETREL) 100 MG capsule TAKE 1 CAPSULE BY MOUTH EVERY MORNING, AT NOON & AT BEDTIME 07/11/22  Yes McCue, Shanda Bumps, NP  carbidopa-levodopa (SINEMET IR) 25-100 MG tablet TAKE 1 TABLET BY MOUTH 8 TIMES DAILY-6 AM, 8 AM, 10 AM, 12 PM, 2 PM, 4 PM, 6 PM AND 8 PM Patient taking differently: Take 1 tablet by mouth See admin instructions. Take 1 tablet by mouth every other hour or 1.5 hours 10/21/22  Yes Huston Foley, MD  escitalopram (LEXAPRO) 20 MG tablet Take 1 tablet (20 mg total) by mouth daily. 03/16/20  Yes York Spaniel, MD  pramipexole (MIRAPEX) 1 MG tablet Take 1 tablet (1 mg total) by mouth 3 (three) times daily. 05/13/22  Yes McCue, Shanda Bumps, NP  SYNTHROID 75 MCG tablet Take 75 mcg by mouth daily before  breakfast. 11/16/18  Yes [provider]  alendronate (FOSAMAX) 70 MG tablet Take 70 mg by mouth once a week. Sunday Patient not taking: Reported on 12/23/2022 01/05/18   [provider]  Pimavanserin Tartrate (NUPLAZID) 10 MG TABS Take 10 mg by mouth daily. Patient not taking: Reported on 12/23/2022 09/16/22   Huston Foley, MD    Physical Exam: Vitals:   12/23/22 2330 12/23/22 2345 12/24/22 0000 12/24/22 0015  BP: 115/72 103/81 (!) 107/59 119/61  Pulse:      Resp: (!) 26 (!) 25 (!) 24 (!) 24  Temp:      TempSrc:      SpO2:      Weight:      Height:        Physical Exam Vitals reviewed.  Constitutional:      General: She is  not in acute distress. HENT:     Head: Normocephalic and atraumatic.     Mouth/Throat:     Mouth: Mucous membranes are dry.     Comments: Dry mucous membranes Eyes:     Extraocular Movements: Extraocular movements intact.  Cardiovascular:     Rate and Rhythm: Normal rate and regular rhythm.     Pulses: Normal pulses.  Pulmonary:     Effort: Pulmonary effort is normal. No respiratory distress.     Breath sounds: Normal breath sounds. No wheezing or rales.  Abdominal:     General: Bowel sounds are normal. There is no distension.     Palpations: Abdomen is soft.     Tenderness: There is no abdominal tenderness.  Musculoskeletal:     Cervical back: Normal range of motion.     Right lower leg: No edema.     Left lower leg: No edema.     Comments: Right lower extremity shortened and externally rotated.  Dorsalis pedis pulse palpable.  Skin:    General: Skin is warm and dry.  Neurological:     Mental Status: She is alert. Mental status is at baseline.     Labs on Admission: I have personally reviewed following labs and imaging studies  CBC: Recent Labs  Lab 12/23/22 1950  WBC 19.2*  NEUTROABS 16.4*  HGB 11.8*  HCT 37.1  MCV 88.8  PLT 271   Basic Metabolic Panel: Recent Labs  Lab 12/23/22 1950  NA 141  K 4.6  CL 106  CO2 25  GLUCOSE 128*  BUN 29*  CREATININE 0.66  CALCIUM 9.4   GFR: Estimated Creatinine Clearance: 45.1 mL/min (by C-G formula based on SCr of 0.66 mg/dL). Liver Function Tests: Recent Labs  Lab 12/23/22 1950  AST 18  ALT 5  ALKPHOS 107  BILITOT 0.6  PROT 7.0  ALBUMIN 3.8   No results for input(s): "LIPASE", "AMYLASE" in the last 168 hours. No results for input(s): "AMMONIA" in the last 168 hours. Coagulation Profile: No results for input(s): "INR", "PROTIME" in the last 168 hours. Cardiac Enzymes: No results for input(s): "CKTOTAL", "CKMB", "CKMBINDEX", "TROPONINI" in the last 168 hours. BNP (last 3 results) No results for input(s):  "PROBNP" in the last 8760 hours. HbA1C: No results for input(s): "HGBA1C" in the last 72 hours. CBG: No results for input(s): "GLUCAP" in the last 168 hours. Lipid Profile: No results for input(s): "CHOL", "HDL", "LDLCALC", "TRIG", "CHOLHDL", "LDLDIRECT" in the last 72 hours. Thyroid Function Tests: No results for input(s): "TSH", "T4TOTAL", "FREET4", "T3FREE", "THYROIDAB" in the last 72 hours. Anemia Panel: No results for input(s): "VITAMINB12", "FOLATE", "FERRITIN", "TIBC", "IRON", "  RETICCTPCT" in the last 72 hours. Urine analysis:    Component Value Date/Time   COLORURINE AMBER (A) 06/04/2018 1716   APPEARANCEUR CLEAR 06/04/2018 1716   LABSPEC 1.020 07/10/2019 1305   PHURINE 5.5 07/10/2019 1305   GLUCOSEU NEGATIVE 07/10/2019 1305   HGBUR TRACE (A) 07/10/2019 1305   BILIRUBINUR NEGATIVE 07/10/2019 1305   KETONESUR TRACE (A) 07/10/2019 1305   PROTEINUR NEGATIVE 07/10/2019 1305   UROBILINOGEN 0.2 07/10/2019 1305   NITRITE NEGATIVE 07/10/2019 1305   LEUKOCYTESUR NEGATIVE 07/10/2019 1305    Radiological Exams on Admission: CT CERVICAL SPINE WO CONTRAST  Result Date: 12/23/2022 CLINICAL DATA:  Status post trauma. EXAM: CT CERVICAL SPINE WITHOUT CONTRAST TECHNIQUE: Multidetector CT imaging of the cervical spine was performed without intravenous contrast. Multiplanar CT image reconstructions were also generated. RADIATION DOSE REDUCTION: This exam was performed according to the departmental dose-optimization program which includes automated exposure control, adjustment of the mA and/or kV according to patient size and/or use of iterative reconstruction technique. COMPARISON:  None Available. FINDINGS: Alignment: Normal. Skull base and vertebrae: No acute fracture. Degenerative changes are seen involving the tip of the dens and the adjacent portion of the anterior arch of C1. Chronic appearing sclerotic changes are noted throughout the C5 vertebral body with a small osseous hemangioma  suspected within the C6 vertebral body. Soft tissues and spinal canal: No prevertebral fluid or swelling. No visible canal hematoma. Disc levels: Marked severity endplate sclerosis, anterior osteophyte formation and posterior bony spurring are seen at the levels of C5-C6 and C6-C7. There is marked severity narrowing of the anterior atlantoaxial articulation, with marked severity intervertebral disc space narrowing seen at the levels of C5-C6 and C6-C7. Bilateral marked severity multilevel facet joint hypertrophy is noted. Upper chest: There is moderate severity bilateral apical scarring and/or atelectasis. Other: None. IMPRESSION: 1. Marked severity multilevel degenerative changes, most prominent at the levels of C5-C6 and C6-C7. 2. No acute cervical spine fracture or subluxation. Electronically Signed   By: Aram Candela M.D.   On: 12/23/2022 22:49   CT HEAD WO CONTRAST  Result Date: 12/23/2022 CLINICAL DATA:  Status post trauma. EXAM: CT HEAD WITHOUT CONTRAST TECHNIQUE: Contiguous axial images were obtained from the base of the skull through the vertex without intravenous contrast. RADIATION DOSE REDUCTION: This exam was performed according to the departmental dose-optimization program which includes automated exposure control, adjustment of the mA and/or kV according to patient size and/or use of iterative reconstruction technique. COMPARISON:  None Available. FINDINGS: Brain: There is mild cerebral atrophy with widening of the extra-axial spaces and ventricular dilatation. There are areas of decreased attenuation within the white matter tracts of the supratentorial brain, consistent with microvascular disease changes. Vascular: No hyperdense vessel or unexpected calcification. Skull: Normal. Negative for fracture or focal lesion. Sinuses/Orbits: No acute finding. Other: None. IMPRESSION: 1. Generalized cerebral atrophy with chronic white matter small vessel ischemic changes. 2. No acute intracranial  abnormality. Electronically Signed   By: Aram Candela M.D.   On: 12/23/2022 22:45   DG Pelvis Portable  Result Date: 12/23/2022 CLINICAL DATA:  Status post trauma. EXAM: PORTABLE PELVIS 1-2 VIEWS COMPARISON:  None Available. FINDINGS: An acute, comminuted fracture is seen extending through the inter trochanteric region of the proximal right femur. There is no evidence of dislocation. No pelvic bone lesions are seen. IMPRESSION: Acute intertrochanteric fracture of the proximal right femur. Electronically Signed   By: Aram Candela M.D.   On: 12/23/2022 22:43   DG FEMUR PORT, MIN 2  VIEWS RIGHT  Result Date: 12/23/2022 CLINICAL DATA:  Status post trauma. EXAM: RIGHT FEMUR PORTABLE 2 VIEW COMPARISON:  None Available. FINDINGS: An acute, comminuted fracture deformity is seen extending through the inter trochanteric region and proximal shaft of the right femur. There is no evidence of dislocation. Soft tissues are unremarkable. IMPRESSION: Acute fracture of the proximal right femur. Electronically Signed   By: Aram Candela M.D.   On: 12/23/2022 22:42   DG Chest Port 1 View  Result Date: 12/23/2022 CLINICAL DATA:  Status post trauma. EXAM: PORTABLE CHEST 1 VIEW COMPARISON:  Jul 29, 2018 FINDINGS: The heart size and mediastinal contours are within normal limits. The lungs are hyperinflated with mild, diffuse chronic appearing increased lung markings noted. There is no evidence of an acute infiltrate, pleural effusion or pneumothorax. Multilevel degenerative changes seen throughout the thoracic spine. IMPRESSION: Chronic appearing increased lung markings without evidence of acute or active cardiopulmonary disease. Electronically Signed   By: Aram Candela M.D.   On: 12/23/2022 22:42    EKG: No EKG done in the ED.  Ordered now and currently pending.  Assessment and Plan  Acute intertrochanteric fracture of the proximal right femur Orthopedics consulted.  She is not on anticoagulation.   Keep n.p.o. after midnight, nonweightbearing, and continue pain management.  Leukocytosis Possibly reactive.  No fever or tachycardia.  Chest x-ray not suggestive of pneumonia.  Check UA and continue to monitor WBC count.  Dehydration She has dry mucous membranes and BUN slightly elevated on labs.  Will be n.p.o. in anticipation of surgery, give gentle IV fluid hydration.  Mild normocytic anemia Hemoglobin currently 11.8, previously >14 on labs done over 3 years ago.  No signs of active bleeding.  Anemia panel and FOBT ordered.  Continue to monitor CBC.  Advanced Parkinson's disease/dementia Continue amantadine and carbidopa-levodopa.  Delirium precautions.  Hypothyroidism Continue Synthroid and check TSH.  RLS Continue pramipexole.  History of PVCs Cardiac monitoring and EKG ordered.  DVT prophylaxis: SCDs Code Status: I have discussed code status with the patient's husband and son.  Recommended DNR status given patient's advanced Parkinson's/dementia, age, and frailty.  Family wants more time to think about code status and they want her to remain FULL CODE at this time.  Husband wants to speak to the patient's brother. Family Communication: Husband and son updated. Consults called: Orthopedics Level of care: Telemetry bed Admission status: It is my clinical opinion that admission to INPATIENT is reasonable and necessary because of the expectation that this patient will require hospital care that crosses at least 2 midnights to treat this condition based on the medical complexity of the problems presented.  Given the aforementioned information, the predictability of an adverse outcome is felt to be significant.  John Giovanni MD [facility]  If 7PM-7AM, please contact night-coverage [email or web address]  12/24/2022, 12:29 AM

## 2022-12-24 NOTE — Anesthesia Procedure Notes (Signed)
Procedure Name: Intubation Date/Time: 12/24/2022 4:06 PM  Performed by: Sandie Ano, CRNAPre-anesthesia Checklist: Patient identified, Emergency Drugs available, Suction available and Patient being monitored Patient Re-evaluated:Patient Re-evaluated prior to induction Oxygen Delivery Method: Circle System Utilized Preoxygenation: Pre-oxygenation with 100% oxygen Induction Type: IV induction Ventilation: Mask ventilation without difficulty Laryngoscope Size: Mac and 3 Grade View: Grade I Tube type: Oral Number of attempts: 1 Airway Equipment and Method: Stylet and Oral airway Placement Confirmation: ETT inserted through vocal cords under direct vision, positive ETCO2 and breath sounds checked- equal and bilateral Secured at: 23 cm Tube secured with: Tape Dental Injury: Teeth and Oropharynx as per pre-operative assessment

## 2022-12-24 NOTE — Progress Notes (Signed)
Message sent to MD re: consent and periop orders

## 2022-12-24 NOTE — Brief Op Note (Signed)
12/24/2022  5:21 PM  PATIENT:  Amy [name]  77 y.o. female  PRE-OPERATIVE DIAGNOSIS:  right intertrochanteric hip fracture  POST-OPERATIVE DIAGNOSIS:  right intertrochanteric hip fracture  PROCEDURE:  Procedure(s): INTRAMEDULLARY (IM) NAIL INTERTROCHANTERIC (Right)  SURGEON:  Surgeons and Role:    * Luci Bank, MD - Primary ASSISTANTS: none   ANESTHESIA:   general  EBL:  300 mL   BLOOD ADMINISTERED:none  DRAINS: none   LOCAL MEDICATIONS USED:  NONE  SPECIMEN:  No Specimen  DISPOSITION OF SPECIMEN:  N/A  COUNTS:  YES  TOURNIQUET:  * No tourniquets in log *  DICTATION: .Note written in EPIC  PLAN OF CARE: Admit to inpatient   PATIENT DISPOSITION:  PACU - hemodynamically stable.   Delay start of Pharmacological VTE agent (>24hrs) due to surgical blood loss or risk of bleeding: not applicable

## 2022-12-24 NOTE — H&P (Signed)
Interval H&P and plan of care update.  Patient medically optimized for right him intramedullary nailing.   Discussion held with family regarding goals of care. They understand this surgery is palliative in nature. It is also their desire the patient be DNR during and after the surgery  .Amy [name] 12/24/2022, 3:52 PM Orthopaedic Surgery

## 2022-12-24 NOTE — Progress Notes (Signed)
No charge note  Patient seen and examined this morning, admitted overnight, H&P reviewed and I agree with the assessment and plan  77 year old female with advanced Parkinson's disease, dementia, Hashimoto thyroiditis, hypothyroidism, comes to the hospital with an unwitnessed fall at home.  X-ray of the hip showed acute intertrochanteric fracture of the proximal right femur.  Orthopedic surgery was consulted and she was admitted to the hospital  Patient is alert on my evaluation, does not appear to be in significant distress.  She is intermittently answering some questions but not all.  She has underlying dementia and is confused to situation.  Orthopedic surgery plans operative repair this afternoon.  For her chronic medical problems continue home medications as below  Scheduled Meds:  amantadine  100 mg Oral TID   carbidopa-levodopa  1 tablet Oral 8 times per day   levothyroxine  75 mcg Oral Q0600   pramipexole  1 mg Oral TID   Continuous Infusions:  sodium chloride 75 mL/hr at 12/24/22 0210   PRN Meds:.acetaminophen, morphine injection, naLOXone (NARCAN)  injection, oxyCODONE  Nahsir Venezia M. Elvera Lennox, MD, PhD [facility]  Between 7 am - 7 pm you can contact me via Amion (for emergencies) or Securechat (non urgent matters).  I am not available 7 pm - 7 am, please contact night coverage MD/APP via Amion

## 2022-12-24 NOTE — Consult Note (Signed)
[facility]  ORTHOPAEDIC CONSULTATION  REQUESTING PHYSICIAN: John Giovanni, MD  ASSESSMENT AND PLAN: 77 y.o. female with the following: Right Hip Intertrochanteric femur fracture  This patient requires inpatient admission to the hospitalist, to include preoperative clearance and perioperative medical management  - Weight Bearing Status/Activity: NWB Right lower extremity  - Additional recommended labs/tests:   -VTE Prophylaxis: Please hold prior to OR; to resume POD#1 at the discretion of the primary team  - Pain control: Recommend PO pain medications PRN; judicious use of narcotics  - Follow-up plan: F/u 10-14 days postop  -Procedures: Plan for OR once patient has been medically optimized  Plan for Right Cephalomedullary nail     Chief Complaint: Right hip pain  HPI: Patient presents as a level 2 trauma.  History of Dementia, Parkinson's.  History is obtained from family and EMS.  Patient had an unwitnessed fall at her house.  Family reports she was found on the ground.  EMS reports the patient is hemodynamically unremarkable, head tremor, seemed to indicate pain in her right hip otherwise unclear.  Level 5 caveat.    Past Medical History:  Diagnosis Date   Dyspnea on exertion    Elevated blood sugar    Hair loss    Hashimoto's thyroiditis    Hypothyroidism    Impaired fasting blood sugar    Osteoporosis    Palpitations    Parkinson's disease (HCC)    Parkinsonism (HCC)    Restless leg syndrome 06/21/2019   Ventricular premature beats    Weakness    Past Surgical History:  Procedure Laterality Date   CATARACT EXTRACTION Bilateral    CESAREAN SECTION     x2   CHOLECYSTECTOMY     FOOT SURGERY Right 01/15/2016   WRIST SURGERY  1999   Social History   Socioeconomic History   Marital status: Married    Spouse name: Richard   Number of children: 2   Years of education: 12   Highest education level: Not on file  Occupational History   Occupation: Scientist, water quality tax  returns for [employer]  Tobacco Use   Smoking status: Never   Smokeless tobacco: Never  Vaping Use   Vaping status: Never Used  Substance and Sexual Activity   Alcohol use: No   Drug use: No   Sexual activity: Not on file  Other Topics Concern   Not on file  Social History Narrative   Lives with husband   Caffeine use: none   Right handed       Pt uses [email or web address] for medication refills.    Phone [phone]    Fax [phone]     Social Determinants of Health   Financial Resource Strain: Not on file  Food Insecurity: Not on file  Transportation Needs: Not on file  Physical Activity: Not on file  Stress: Not on file  Social Connections: Not on file   Family History  Problem Relation Age of Onset   Breast cancer Mother    Lung disease Brother    Parkinson's disease Neg Hx    Allergies  Allergen Reactions   Ciprofloxacin Rash   Prior to Admission medications   Medication Sig Start Date End Date Taking? Authorizing Provider  amantadine (SYMMETREL) 100 MG capsule TAKE 1 CAPSULE BY MOUTH EVERY MORNING, AT NOON & AT BEDTIME 07/11/22  Yes McCue, Shanda Bumps, NP  carbidopa-levodopa (SINEMET IR) 25-100 MG tablet TAKE 1 TABLET BY MOUTH 8 TIMES DAILY-6 AM, 8 AM, 10 AM, 12 PM, 2 PM, 4 PM,  6 PM AND 8 PM Patient taking differently: Take 1 tablet by mouth See admin instructions. Take 1 tablet by mouth every other hour or 1.5 hours 10/21/22  Yes Huston Foley, MD  escitalopram (LEXAPRO) 20 MG tablet Take 1 tablet (20 mg total) by mouth daily. 03/16/20  Yes York Spaniel, MD  pramipexole (MIRAPEX) 1 MG tablet Take 1 tablet (1 mg total) by mouth 3 (three) times daily. 05/13/22  Yes Ihor Austin, NP  SYNTHROID 75 MCG tablet Take 75 mcg by mouth daily before breakfast. 11/16/18  Yes [provider]  alendronate (FOSAMAX) 70 MG tablet Take 70 mg by mouth once a week. Sunday Patient not taking: Reported on 12/23/2022 01/05/18   [provider]  Pimavanserin Tartrate  (NUPLAZID) 10 MG TABS Take 10 mg by mouth daily. Patient not taking: Reported on 12/23/2022 09/16/22   Huston Foley, MD   CT CERVICAL SPINE WO CONTRAST  Result Date: 12/23/2022 CLINICAL DATA:  Status post trauma. EXAM: CT CERVICAL SPINE WITHOUT CONTRAST TECHNIQUE: Multidetector CT imaging of the cervical spine was performed without intravenous contrast. Multiplanar CT image reconstructions were also generated. RADIATION DOSE REDUCTION: This exam was performed according to the departmental dose-optimization program which includes automated exposure control, adjustment of the mA and/or kV according to patient size and/or use of iterative reconstruction technique. COMPARISON:  None Available. FINDINGS: Alignment: Normal. Skull base and vertebrae: No acute fracture. Degenerative changes are seen involving the tip of the dens and the adjacent portion of the anterior arch of C1. Chronic appearing sclerotic changes are noted throughout the C5 vertebral body with a small osseous hemangioma suspected within the C6 vertebral body. Soft tissues and spinal canal: No prevertebral fluid or swelling. No visible canal hematoma. Disc levels: Marked severity endplate sclerosis, anterior osteophyte formation and posterior bony spurring are seen at the levels of C5-C6 and C6-C7. There is marked severity narrowing of the anterior atlantoaxial articulation, with marked severity intervertebral disc space narrowing seen at the levels of C5-C6 and C6-C7. Bilateral marked severity multilevel facet joint hypertrophy is noted. Upper chest: There is moderate severity bilateral apical scarring and/or atelectasis. Other: None. IMPRESSION: 1. Marked severity multilevel degenerative changes, most prominent at the levels of C5-C6 and C6-C7. 2. No acute cervical spine fracture or subluxation. Electronically Signed   By: Aram Candela M.D.   On: 12/23/2022 22:49   CT HEAD WO CONTRAST  Result Date: 12/23/2022 CLINICAL DATA:  Status post  trauma. EXAM: CT HEAD WITHOUT CONTRAST TECHNIQUE: Contiguous axial images were obtained from the base of the skull through the vertex without intravenous contrast. RADIATION DOSE REDUCTION: This exam was performed according to the departmental dose-optimization program which includes automated exposure control, adjustment of the mA and/or kV according to patient size and/or use of iterative reconstruction technique. COMPARISON:  None Available. FINDINGS: Brain: There is mild cerebral atrophy with widening of the extra-axial spaces and ventricular dilatation. There are areas of decreased attenuation within the white matter tracts of the supratentorial brain, consistent with microvascular disease changes. Vascular: No hyperdense vessel or unexpected calcification. Skull: Normal. Negative for fracture or focal lesion. Sinuses/Orbits: No acute finding. Other: None. IMPRESSION: 1. Generalized cerebral atrophy with chronic white matter small vessel ischemic changes. 2. No acute intracranial abnormality. Electronically Signed   By: Aram Candela M.D.   On: 12/23/2022 22:45   DG Pelvis Portable  Result Date: 12/23/2022 CLINICAL DATA:  Status post trauma. EXAM: PORTABLE PELVIS 1-2 VIEWS COMPARISON:  None Available. FINDINGS: An acute, comminuted  fracture is seen extending through the inter trochanteric region of the proximal right femur. There is no evidence of dislocation. No pelvic bone lesions are seen. IMPRESSION: Acute intertrochanteric fracture of the proximal right femur. Electronically Signed   By: Aram Candela M.D.   On: 12/23/2022 22:43   DG FEMUR PORT, MIN 2 VIEWS RIGHT  Result Date: 12/23/2022 CLINICAL DATA:  Status post trauma. EXAM: RIGHT FEMUR PORTABLE 2 VIEW COMPARISON:  None Available. FINDINGS: An acute, comminuted fracture deformity is seen extending through the inter trochanteric region and proximal shaft of the right femur. There is no evidence of dislocation. Soft tissues are  unremarkable. IMPRESSION: Acute fracture of the proximal right femur. Electronically Signed   By: Aram Candela M.D.   On: 12/23/2022 22:42   DG Chest Port 1 View  Result Date: 12/23/2022 CLINICAL DATA:  Status post trauma. EXAM: PORTABLE CHEST 1 VIEW COMPARISON:  Jul 29, 2018 FINDINGS: The heart size and mediastinal contours are within normal limits. The lungs are hyperinflated with mild, diffuse chronic appearing increased lung markings noted. There is no evidence of an acute infiltrate, pleural effusion or pneumothorax. Multilevel degenerative changes seen throughout the thoracic spine. IMPRESSION: Chronic appearing increased lung markings without evidence of acute or active cardiopulmonary disease. Electronically Signed   By: Aram Candela M.D.   On: 12/23/2022 22:42   Family History Reviewed and non-contributory, no pertinent history of problems with bleeding or anesthesia    Review of Systems No fevers or chills No numbness or tingling No chest pain No shortness of breath No bowel or bladder dysfunction No GI distress No headaches    OBJECTIVE  Vitals:Patient Vitals for the past 8 hrs:  BP Temp Temp src Pulse Resp SpO2 Height Weight  12/24/22 0015 119/61 -- -- -- (!) 24 -- -- --  12/24/22 0000 (!) 107/59 -- -- -- (!) 24 -- -- --  12/23/22 2345 103/81 -- -- -- (!) 25 -- -- --  12/23/22 2330 115/72 -- -- -- (!) 26 -- -- --  12/23/22 2315 115/69 -- -- -- (!) 22 -- -- --  12/23/22 2300 122/77 (!) 97.4 F (36.3 C) -- -- (!) 29 100 % -- --  12/23/22 2215 116/65 -- -- 90 (!) 27 100 % -- --  12/23/22 2200 103/76 -- -- 91 (!) 22 99 % -- --  12/23/22 2145 94/65 -- -- 93 18 97 % -- --  12/23/22 2130 (!) 105/51 -- -- 93 (!) 34 98 % -- --  12/23/22 2115 116/78 -- -- 93 (!) 28 98 % -- --  12/23/22 2100 106/86 -- -- 94 (!) 28 99 % -- --  12/23/22 2051 -- -- -- -- -- -- 5\' 2"  (1.575 m) 47 kg  12/23/22 2045 97/81 -- -- 96 (!) 27 99 % -- --  12/23/22 2035 124/73 -- -- 95 19 97 %  -- --  12/23/22 2015 118/71 -- -- 97 (!) 27 97 % -- --  12/23/22 2003 (!) 123/95 (!) 97 F (36.1 C) Axillary 98 (!) 28 96 % -- --  12/23/22 2000 118/65 -- -- 99 20 95 % -- --   General: Frail, tremulous, unable to articulate, painful response to stimulus of right leg Cardiovascular: Extremities are warm Respiratory: No cyanosis, no use of accessory musculature Skin: No lesions in the area of chief complaint  Neurologic: Sensation intact distally  Psychiatric: Patient is not competent for consent Lymphatic: No swelling obvious and reported other than the area involved  in the exam below Extremities  RLE: Extremity held in a fixed position, flexed and externally rotated. Painful grimace with ROM of hip. Unable to assess sensation 2+ DP pulse.  Toes are WWP.  Active motion intact in the TA/EHL/GS.  Test Results Imaging XR of the Right demonstrates a Intertrochanteric femur fracture with subtrochanteric extension  Labs cbc Recent Labs    12/23/22 1950  WBC 19.2*  HGB 11.8*  HCT 37.1  PLT 271    Labs inflam No results for input(s): "CRP" in the last 72 hours.  Invalid input(s): "ESR"  Labs coag No results for input(s): "INR", "PTT" in the last 72 hours.  Invalid input(s): "PT"  Recent Labs    12/23/22 1950  NA 141  K 4.6  CL 106  CO2 25  GLUCOSE 128*  BUN 29*  CREATININE 0.66  CALCIUM 9.4

## 2022-12-24 NOTE — Anesthesia Preprocedure Evaluation (Addendum)
Anesthesia Evaluation  Patient identified by MRN, date of birth, ID band Patient awake    Reviewed: Allergy & Precautions, NPO status , Patient's Chart, lab work & pertinent test results  Airway Mallampati: II  TM Distance: >3 FB Neck ROM: Full    Dental no notable dental hx. (+) Dental Advisory Given   Pulmonary neg pulmonary ROS   Pulmonary exam normal breath sounds clear to auscultation       Cardiovascular negative cardio ROS Normal cardiovascular exam Rhythm:Regular Rate:Normal     Neuro/Psych       Dementia negative neurological ROS     GI/Hepatic negative GI ROS, Neg liver ROS,,,  Endo/Other  Hypothyroidism    Renal/GU negative Renal ROS     Musculoskeletal negative musculoskeletal ROS (+)    Abdominal   Peds  Hematology negative hematology ROS (+)   Anesthesia Other Findings   Reproductive/Obstetrics                             Anesthesia Physical Anesthesia Plan  ASA: 3  Anesthesia Plan: General   Post-op Pain Management: Tylenol PO (pre-op)*   Induction: Intravenous  PONV Risk Score and Plan: 4 or greater and Ondansetron, Dexamethasone and Treatment may vary due to age or medical condition  Airway Management Planned: Oral ETT  Additional Equipment:   Intra-op Plan:   Post-operative Plan: Extubation in OR  Informed Consent: I have reviewed the patients History and Physical, chart, labs and discussed the procedure including the risks, benefits and alternatives for the proposed anesthesia with the patient or authorized representative who has indicated his/her understanding and acceptance.   Patient has DNR.  Discussed DNR with power of attorney.   Dental advisory given  Plan Discussed with: CRNA  Anesthesia Plan Comments: (Partial code. Family at bedside adamant they do not want chest compressions. We discussed medical code only. Family agrees to that. )         Anesthesia Quick Evaluation

## 2022-12-24 NOTE — Op Note (Signed)
.  Orthopaedic Surgery Operative Note (CSN: [id number])  Amy [name]  17-Dec-1945 Date of Surgery: 12/24/2022   Diagnoses:  right intertrochanteric hip fracture  Procedure: Right hip cephalomedullary nailing   Operative Finding Successful completion of the planned procedure.  Right hip cephalomedullary nail due to subtrochanteric extension of fracture  Post-operative plan: The patient will be WBAT..  DVT prophylaxis Aspirin 81 mg twice [name] for 6 weeks.   Pain control with PRN pain medication preferring oral medicines.  Follow up plan will be scheduled in approximately 14 days for incision check and XR.  Post-Op Diagnosis: Same Surgeons:Primary: Luci Bank, MD Assistants:Caroline McBane PA-C Location: [facility] OR ROOM 05 Anesthesia: General Antibiotics: Ancef 2 g Tourniquet time: N/A Estimated Blood Loss: 300 cc Complications: None Specimens: None Implants: Implant Name Type Inv. Item Serial No. Manufacturer Lot No. LRB No. Used Action  HFN RH 130 DEG X - [id number] Nail HFN RH 130 DEG X  [employer](ORTH,TRAU,[employer],SG) [id number] Right 1 Implanted  SCREW LAG HIP NAIL 10.5X95 - [id number] Screw SCREW LAG HIP NAIL 10.5X95  [employer](ORTH,TRAU,BIO,SG) [id number] Right 1 Implanted  SCREW BONE CORTICAL 5.0X42 - [id number] Screw SCREW BONE CORTICAL 5.0X42  [employer](ORTH,TRAU,BIO,SG) [id number] Right 1 Implanted    Indications for Surgery:   Amy [name] is a 77 y.o. female with with advanced Parkinson's disease, dementia, Hashimoto thyroiditis, hypothyroidism, comes to the hospital with an unwitnessed fall at home. X-ray of the hip showed acute intertrochanteric fracture of the proximal right femur.   Benefits and risks of operative and nonoperative management were discussed prior to surgery with patient/guardian(s) and informed consent form was completed.  Specific risks including infection, bleeding, damage to neurovascular structures, failure of implant, failure to  heal, need for additional surgery, need for assistive device and cardiopulmonary complications for anesthesia. The patient's family agreed to proceed with surgery but wanted her to be DNR during the procedure.   Procedure:    .The patient was placed supine on a fracture table and appropriate reduction was obtained and visualized on fluoroscopy prior to the beginning of the procedure.  We made an incision proximal to the greater trochanter and dissected down through the fascia.  We then carefully placed our starting wire localizing under fluoroscopy prior to advancing the awl into the bone and entry reamer was used.  A sball-tipped guidewire through into the femoral canal.  The wire was passed the physeal scar of the distal femur. The wire was measured and we selected a length of nail noted above.The canal was sequentially reamed up to a 11 mm reamer to accommodate a 9 mm diameter nail.   At this point we placed our nail localizing under fluoroscopy that it was at the appropriate level prior to using the outrigger device to pass a wire and then the cephalo-medullary screw.  The screw was locked proximally to avoid over collapse. Using free hand perfect circle technique a single distal interlock screw was placed through the nail. Final fluoroscopy images were taken.  The wounds were thoroughly irrigated closed in a multilayer fashion with absorable sutures and skin staples.  A sterile dressing was placed.  The patient was awoken from general anesthesia and taken to the PACU in stable condition without complication.     Luci Bank 12/24/2022, 7:58 PM Orthopaedic Surgery

## 2022-12-24 NOTE — Progress Notes (Signed)
Initial Nutrition Assessment  DOCUMENTATION CODES:   Non-severe (moderate) malnutrition in context of chronic illness  INTERVENTION:  Diet progression when medically appropriate, goal diet regular. Provide tray set up and feeding assist as needed.  Supplement meals with Ensure Enlive bid for repletion and to optimize nutrient intakes.  3.   MVI daily for micronutrient support due to prolonged suboptimal intakes.   NUTRITION DIAGNOSIS:   Moderate Malnutrition related to chronic illness, decreased appetite as evidenced by moderate muscle depletion, moderate fat depletion, energy intake < 75% for > or equal to 1 month.    GOAL:   Patient will meet greater than or equal to 90% of their needs    MONITOR:   PO intake, Labs, Supplement acceptance, Weight trends, Diet advancement, Skin  REASON FOR ASSESSMENT:   Consult Assessment of nutrition requirement/status  ASSESSMENT:   77 y/o female presented to the ED via EMS after unwitnessed fall at home. Family stays with patient 24 hrs daily. When husband stepped away for a few minutes, he heard her fall. Found to have acute intertrochanteric fracture of the proximal right femur. PMH: advanced Parkinson's disease, dementia, non-verbal, Hashimoto's thyroiditis, hypothyroidism, osteoporosis, RLS, PVCs, dyspnea on exertion, elevated blood sugar, hair loss, impaired fasting blood sugar, palpitations, Parkinsonism, ventricular premature beats, weakness.   NPO for surgery for hip repair. Nutrition hx obtained from husband and son at bedside. She has not had any recent changes to her diet, however over time, she has been gradually eating less. Consumes two to three meals daily, B cereal, egg, coffee, L skips or small meal, D full meal.  Takes one carton of Ensure.  She is able to self feed.  No difficulty chewing or swallow food and or liquids noted so far. Tolerates all foods at baseline.  Stated usual body weight 135#, per son more recently  107#.  Per EMR, weights have been gradually trending down the past four years. Edema, non-pitting right lower extremity. Normally has regular bowel movements.      Wt Readings from Last 10 Encounters:  12/24/22 48.6 kg  09/16/22 46.7 kg  05/13/22 49.9 kg  05/01/21 51.2 kg  01/22/21 52.3 kg  08/28/20 57.2 kg  03/16/20 60.6 kg  10/26/19 59.9 kg  06/21/19 61.2 kg  03/01/19 61.7 kg     Medications reviewed and include synthroid, 0.9% sodium chloride @75  mL/hr.  Labs reviewed.     NUTRITION - FOCUSED PHYSICAL EXAM:   Flowsheet Row Most Recent Value  Orbital Region No depletion  Upper Arm Region Moderate depletion  Thoracic and Lumbar Region No depletion  Buccal Region No depletion  Temple Region Moderate depletion  Clavicle Bone Region Mild depletion  Clavicle and Acromion Bone Region Mild depletion  Scapular Bone Region Unable to assess  Dorsal Hand No depletion  Patellar Region No depletion  Anterior Thigh Region Mild depletion  Posterior Calf Region Mild depletion  Edema (RD Assessment) None  Hair Reviewed  Eyes Reviewed  Skin Reviewed  Nails Reviewed       Diet Order:   Diet Order             Diet NPO time specified Except for: Sips with Meds  Diet effective now                   EDUCATION NEEDS:   Not appropriate for education at this time  Skin:  Skin Assessment: Reviewed RN Assessment  Last BM:  prior to admission  Height:   Ht Readings  from Last 1 Encounters:  12/23/22 5\' 2"  (1.575 m)    Weight:   Wt Readings from Last 1 Encounters:  12/24/22 48.6 kg    Ideal Body Weight:  52.3 kg  BMI:  Body mass index is 19.6 kg/m.  Estimated Nutritional Needs:   Kcal:  8119-1478  Protein:  58-73  Fluid:  1701    Alvino Chapel, RDLD Clinical Dietitian See AMION for contact information

## 2022-12-25 ENCOUNTER — Encounter (HOSPITAL_COMMUNITY): Payer: Self-pay | Admitting: Orthopedic Surgery

## 2022-12-25 DIAGNOSIS — G20A1 Parkinson's disease without dyskinesia, without mention of fluctuations: Secondary | ICD-10-CM

## 2022-12-25 DIAGNOSIS — W19XXXA Unspecified fall, initial encounter: Secondary | ICD-10-CM | POA: Diagnosis not present

## 2022-12-25 DIAGNOSIS — Z7189 Other specified counseling: Secondary | ICD-10-CM | POA: Diagnosis not present

## 2022-12-25 DIAGNOSIS — E860 Dehydration: Secondary | ICD-10-CM

## 2022-12-25 DIAGNOSIS — E440 Moderate protein-calorie malnutrition: Secondary | ICD-10-CM

## 2022-12-25 DIAGNOSIS — S72001A Fracture of unspecified part of neck of right femur, initial encounter for closed fracture: Secondary | ICD-10-CM | POA: Diagnosis not present

## 2022-12-25 DIAGNOSIS — Z515 Encounter for palliative care: Secondary | ICD-10-CM | POA: Diagnosis not present

## 2022-12-25 MED ORDER — ENSURE ENLIVE PO LIQD
237.0000 mL | Freq: Two times a day (BID) | ORAL | Status: DC
  Administered 2022-12-25 – 2022-12-30 (×9): 237 mL via ORAL

## 2022-12-25 MED ORDER — ADULT MULTIVITAMIN W/MINERALS CH
1.0000 | ORAL_TABLET | Freq: Every day | ORAL | Status: DC
  Administered 2022-12-25 – 2022-12-30 (×5): 1 via ORAL
  Filled 2022-12-25 (×5): qty 1

## 2022-12-25 NOTE — Progress Notes (Signed)
Progress Note   Patient: Amy [name] ZOX:[id number] DOB: 02/11/1946 DOA: 12/23/2022     2 DOS: the patient was seen and examined on 12/25/2022    Subjective:  Patient seen and examined at bedside this morning Denies any acute overnight events PT OT requested Given advanced dementia palliative care have been consulted   Brief hospital course: From HPI "Amy [name] is a 77 y.o. female with medical history significant of advanced Parkinson's disease, dementia, Hashimoto's thyroiditis, hypothyroidism, osteoporosis, RLS, PVCs presented to ED via EMS after an unwitnessed fall at home.  Family reported that patient was found on the ground.  She was hemodynamically stable with EMS.  Complained of right hip pain.  Labs notable for WBC 19.2, hemoglobin 11.8 (previously >14 on labs done over 3 years ago), MCV 88.8.  X-ray of hip/pelvis showing acute intertrochanteric fracture of the proximal right femur.  Chest x-ray showing no acute cardiopulmonary disease.  CT head/C-spine negative for acute findings.  EDP discussed the case with Dr. Hulda Humphrey, orthopedics will consult.  [facility] called to admit.   Patient has advanced dementia, nonverbal.  History provided by her husband and son at bedside.  Husband states he stays with the patient 24 hours a day due to her advanced Parkinson's/dementia.  Today he stepped away from her for a few minutes when he heard her fall.  When he arrived, she was on the ground screaming in pain.  Other than the fall today, she has been at baseline.  No fevers, cough, shortness of breath, vomiting, diarrhea, or abdominal pain reported by family."    Assessment and Plan:  Acute intertrochanteric fracture of the proximal right femur Patient is status post right hip intramedullary nailing done by orthopedics on 12/24/2022 Continue as needed pain management PT OT consulted TOC consulted Monitor pain level closely  Leukocytosis secondary to urinary tract infection Patient already on  cefazolin we will continue  Dehydration She has dry mucous membranes and BUN slightly elevated on labs.  Will be n.p.o. in anticipation of surgery, give gentle IV fluid hydration.   Mild normocytic anemia Hemoglobin currently 11.8, previously >14 on labs done over 3 years ago.  No signs of active bleeding.  Anemia panel and FOBT ordered.  Continue to monitor CBC.   Advanced Parkinson's disease/dementia Continue amantadine and carbidopa-levodopa.   Continue delirium precautions. Palliative care consulted  Hypothyroidism Continue levothyroxine  Restless leg syndrome Continue pramipexole.   History of PVCs Continue cardiac monitoring   DVT prophylaxis: SCDs  Code Status: DNI DNR   Consults called: Orthopedics  Level of care: Telemetry bed   Physical Exam: ENT:     Head: Normocephalic and atraumatic.     Mouth/Throat:     Mouth: Mucous membranes are dry.     Comments: Dry mucous membranes Eyes:     Extraocular Movements: Extraocular movements intact.  Cardiovascular:     Rate and Rhythm: Normal rate and regular rhythm.     Pulses: Normal pulses.  Pulmonary:     Effort: Pulmonary effort is normal. No respiratory distress.     Breath sounds: Normal breath sounds. No wheezing or rales.  Abdominal:     General: Bowel sounds are normal. There is no distension.     Palpations: Abdomen is soft.     Tenderness: There is no abdominal tenderness.  Musculoskeletal:     Cervical back: Normal range of motion.     Right lower leg: No edema.     Left lower leg: No edema.  Comments: Dressing noted to the hip Skin:    General: Skin is warm and dry.  Neurological:     Mental Status: She is alert. Mental status is at baseline.     Data Reviewed: Reviewed patient lab results including CBC, CMP as well as urine culture results showing findings of UTI, I have also reviewed x-ray of the right femur showing findings of proximal femoral fracture, I have also reviewed orthopedics  documentation Family Communication: Husband and son updated  Disposition: Status is: Inpatient   Planned Discharge Destination: Pending orthopedic clearance as well as PT OT commendation, I anticipate patient will need rehab   Time spent: 55 minutes     Latest Ref Rng & Units 12/24/2022    6:12 AM 12/23/2022    7:50 PM 08/15/2019   10:57 AM  CBC  WBC 4.0 - 10.5 K/uL 15.3  19.2  8.6   Hemoglobin 12.0 - 15.0 g/dL 40.9  81.1  91.4   Hematocrit 36.0 - 46.0 % 34.4  37.1  44.5   Platelets 150 - 400 K/uL 232  271  272        Latest Ref Rng & Units 12/23/2022    7:50 PM 07/29/2018   11:58 AM 06/04/2018    5:17 PM  CMP  Glucose 70 - 99 mg/dL 782  92  98   BUN 8 - 23 mg/dL 29  14  15    Creatinine 0.44 - 1.00 mg/dL 9.56  2.13  0.86   Sodium 135 - 145 mmol/L 141  138  137   Potassium 3.5 - 5.1 mmol/L 4.6  4.2  5.0   Chloride 98 - 111 mmol/L 106  102  105   CO2 22 - 32 mmol/L 25  21  23    Calcium 8.9 - 10.3 mg/dL 9.4  9.5  8.6   Total Protein 6.5 - 8.1 g/dL 7.0  7.0  6.3   Total Bilirubin 0.3 - 1.2 mg/dL 0.6  0.8  1.1   Alkaline Phos 38 - 126 U/L 107  71  67   AST 15 - 41 U/L 18  27  42   ALT 0 - 44 U/L 5  10  8      Vitals:   12/24/22 2353 12/25/22 0435 12/25/22 0533 12/25/22 0900  BP: 115/68 (!) 89/77 99/60 (!) 98/54  Pulse: 84 85  95  Resp:  20 20 18   Temp:    98 F (36.7 C)  TempSrc:    Oral  SpO2: 95% 99% 100% 100%  Weight:      Height:        Author: Loyce Dys, MD 12/25/2022 1:56 PM  For on call review [email or web address].

## 2022-12-25 NOTE — Evaluation (Signed)
Occupational Therapy Evaluation Patient Details Name: Amy [name] MRN: [id number] DOB: Dec 24, 1945 Today's Date: 12/25/2022   History of Present Illness 77 y.o. female  admitted 10/14 after fall at home where she sustained right hip fx. IM nail on 10/15.  PMH: advanced Parkinson's disease, dementia, Hashimoto's thyroiditis, hypothyroidism, osteoporosis, RLS, PVCs   Clinical Impression   Pt admitted for above and limited by problem list below.  She requires +2 total assist for bed mobility but progressed from max assist to min guard sitting EOB over 10 minutes.  Requires min to total assist +2 for ADLs.  She reports needing assist for ADLs at baseline, but using RW for mobility; per chart pt has 24/7 support from spouse and pt is a poor historian due to advanced dementia baseline.  She follows some commands with increased time, but only oriented to self.  If family can provide physical assist required at dc and 24/7 support would recommend home with Home heatlh services but otherwise will need >3hrs/day inpatient setting. Will follow acutely.       If plan is discharge home, recommend the following: Two people to help with walking and/or transfers;Two people to help with bathing/dressing/bathroom (total care)    Functional Status Assessment  Patient has had a recent decline in their functional status and demonstrates the ability to make significant improvements in function in a reasonable and predictable amount of time.  Equipment Recommendations  Hospital bed;BSC/3in1;Wheelchair (measurements OT);Wheelchair cushion (measurements OT)    Recommendations for Other Services       Precautions / Restrictions Precautions Precautions: Fall Precaution Comments: watch BP Restrictions Weight Bearing Restrictions: Yes RLE Weight Bearing: Weight bearing as tolerated      Mobility Bed Mobility Overal bed mobility: Needs Assistance Bed Mobility: Supine to Sit, Sit to Supine     Supine to  sit: Total assist, +2 for physical assistance, +2 for safety/equipment Sit to supine: Total assist, +2 for physical assistance, +2 for safety/equipment   General bed mobility comments: Pt needed total assist +2 to asssist to EOB with incr pain right LE with movement.    Transfers                   General transfer comment: unable      Balance Overall balance assessment: Needs assistance Sitting-balance support: No upper extremity supported, Feet supported Sitting balance-Leahy Scale: Zero Sitting balance - Comments: Initially pt was max assist of 2 to sit up on EOB but with time progressed to CGA. Pt needs UE support at all times and took incr time to gain posture at EOB. Pt sat a total of 10 min at EOB and was able to wash her face with cues. Postural control: Posterior lean                                 ADL either performed or assessed with clinical judgement   ADL Overall ADL's : Needs assistance/impaired     Grooming: Sitting;Moderate assistance Grooming Details (indicate cue type and reason): able to wash face but would need increased assist for higher level grooming tasks due to cognition         Upper Body Dressing : Moderate assistance;Sitting   Lower Body Dressing: Total assistance;+2 for safety/equipment;+2 for physical assistance;Sitting/lateral leans;Bed level     Toilet Transfer Details (indicate cue type and reason): deferred         Functional mobility during ADLs: Total  assistance;+2 for physical assistance;+2 for safety/equipment       Vision   Vision Assessment?: No apparent visual deficits Additional Comments: not formally tested     Perception         Praxis         Pertinent Vitals/Pain Pain Assessment Pain Assessment: Faces Faces Pain Scale: Hurts even more Pain Location: R LE Pain Descriptors / Indicators: Discomfort, Grimacing, Guarding Pain Intervention(s): Limited activity within patient's tolerance,  Monitored during session, Repositioned     Extremity/Trunk Assessment Upper Extremity Assessment Upper Extremity Assessment: Defer to OT evaluation   Lower Extremity Assessment Lower Extremity Assessment: RLE deficits/detail;LLE deficits/detail RLE Deficits / Details: did pump ankle to command but did not move LE other than that, P.ROM limited by pain LLE Deficits / Details: would not move to command   Cervical / Trunk Assessment Cervical / Trunk Assessment: Kyphotic   Communication Communication Communication: Difficulty following commands/understanding;Difficulty communicating thoughts/reduced clarity of speech Following commands: Follows one step commands inconsistently Cueing Techniques: Verbal cues;Gestural cues;Tactile cues;Visual cues   Cognition Arousal: Alert Behavior During Therapy: WFL for tasks assessed/performed Overall Cognitive Status: History of cognitive impairments - at baseline                                 General Comments: pt with dementia and parkinsons at baseline.  Per chart 24/7 from spouse. oriented to self only. pt verbalizing but non sensical at times.     General Comments  VSS with BP somewhat soft without c/o dizziness    Exercises     Shoulder Instructions      Home Living Family/patient expects to be discharged to:: Private residence Living Arrangements: Spouse/significant other Available Help at Discharge: Available 24 hours/day                             Additional Comments: pt unable to report      Prior Functioning/Environment Prior Level of Function : Patient poor historian/Family not available;Needs assist             Mobility Comments: pt reports using RW, per chart spouse assisting pt with mobility ADLs Comments: pt reports spouse assists with ADLs        OT Problem List: Decreased strength;Decreased activity tolerance;Impaired balance (sitting and/or standing);Decreased safety  awareness;Decreased cognition;Decreased knowledge of use of DME or AE;Decreased knowledge of precautions;Pain      OT Treatment/Interventions: Self-care/ADL training;DME and/or AE instruction;Therapeutic activities;Balance training;Patient/family education    OT Goals(Current goals can be found in the care plan section) Acute Rehab OT Goals Patient Stated Goal: none stated OT Goal Formulation: Patient unable to participate in goal setting Time For Goal Achievement: 01/08/23 Potential to Achieve Goals: Fair  OT Frequency: Min 1X/week    Co-evaluation PT/OT/SLP Co-Evaluation/Treatment: Yes Reason for Co-Treatment: For patient/therapist safety;To address functional/ADL transfers;Necessary to address cognition/behavior during functional activity PT goals addressed during session: Mobility/safety with mobility OT goals addressed during session: ADL's and self-care      AM-PAC OT "6 Clicks" Daily Activity     Outcome Measure Help from another person eating meals?: A Lot Help from another person taking care of personal grooming?: A Lot Help from another person toileting, which includes using toliet, bedpan, or urinal?: Total Help from another person bathing (including washing, rinsing, drying)?: Total Help from another person to put on and taking off regular upper body  clothing?: Total Help from another person to put on and taking off regular lower body clothing?: Total 6 Click Score: 8   End of Session Nurse Communication: Mobility status;Other (comment) (hypotension)  Activity Tolerance: Patient limited by pain Patient left: in bed;with call bell/phone within reach;with bed alarm set  OT Visit Diagnosis: Other abnormalities of gait and mobility (R26.89);Muscle weakness (generalized) (M62.81);Pain Pain - Right/Left: Right Pain - part of body: Leg;Hip                Time: 8295-6213 OT Time Calculation (min): 23 min Charges:  OT General Charges $OT Visit: 1 Visit OT Evaluation $OT  Eval Moderate Complexity: 1 Mod  Barry Brunner, OT Acute Rehabilitation Services Office [phone]    Chancy Milroy 12/25/2022, 2:07 PM

## 2022-12-25 NOTE — Consult Note (Signed)
Consultation Note Date: 12/25/2022   Patient Name: Amy [name]  DOB: October 13, 1945  MRN: [id number]  Age / Sex: 77 y.o., female  PCP: Georgann Housekeeper, MD Referring Physician: Loyce Dys, MD  Reason for Consultation: Establishing goals of care  HPI/Patient Profile: 77 y.o. female  with past medical history of advanced Parkinson's disease, dementia, Hashimoto's thyroiditis, hypothyroidism, osteoporosis, RLS, PVCs admitted on 12/23/2022 with unwitnessed fall at home.   X-ray of hip/pelvis showing acute intertrochanteric fracture of the proximal right femur. She is admitted for futher management including surgical repair  PMT has been consulted POD 1 to assist with goals of care conversation.  Clinical Assessment and Goals of Care:  I have reviewed medical records including EPIC notes, labs and imaging, assessed the patient and then had a phone conversation with [name] and son Apolinar Junes to discuss diagnosis prognosis, GOC, EOL wishes, disposition and options.  I introduced Palliative Medicine as specialized medical care for people living with serious illness. It focuses on providing relief from the symptoms and stress of a serious illness. The goal is to improve quality of life for both the patient and the family.  We discussed a brief life review of the patient and then focused on their current illness.   I attempted to elicit values and goals of care important to the patient.    Medical History Review and Understanding:  [name] shared that she has had difficulties with her Parkinson's symptoms for several years.  They understand that this right hip fracture increases the likelihood of further complications and worsening symptom burden.  Social History: Patient is married and receives 24/7 care from her husband.  They are well supported by patient's son.  She has difficulties with  anxiety, hallucinations, with unpredictable pattern.  Functional and Nutritional State: Patient has not been able to walk for several years.  She is minimally verbal, difficult to understand.  Albumin of 3.8 noted.  Palliative Symptoms: Chronic pain described as pins-and-needles, stiffness, anxiety  Advance Directives: A detailed discussion regarding advanced directives was had.  [name] reports she has a living will completed.  Encouraged to bring in copy of documentation.  Not currently on file.  Code Status: Concepts specific to code status, artifical feeding and hydration, and rehospitalization were considered and discussed.  [name] confirmed their conversation with orthopedic surgery, at which time they had expressed desire for DNR/DNI.  Discussion: Provided education on CODE STATUS, explaining that DNR does not impact the care patient receives up until time of cardiac arrest and death. Recommended consideration of DNR status, understanding evidenced-based poor outcomes in similar hospitalized patients, as the cause of the arrest is likely associated with chronic/terminal disease rather than a reversible acute cardio-pulmonary event.  They agree that this would be best for the patient and likely consistent with what she would prefer for herself if she understood her current situation.  They are agreeable to CODE STATUS being updated accordingly at this time. Otherwise, patient's family tell me that they have not discussed her care preferences in the past and are uncertain about her overall wishes.  They do not want to pursue aggressive interventions with potential to cause more harm than benefit, however they want to be clear that they still desire to treat the treatable and improve her health is much as possible.  This includes therapies and potentially short-term rehab prior to returning home. They are open to further conversations based on how she does.   Discussed the  importance  of continued conversation with family and the medical providers regarding overall plan of care and treatment options, ensuring decisions are within the context of the patient's values and GOCs.   Questions and concerns were addressed.  Hard Choices booklet left for review. The family was encouraged to call with questions or concerns.  PMT will continue to support holistically.   SUMMARY OF RECOMMENDATIONS   -CODE STATUS changed to DNR-Limited (no intubation).  Gold form signed and placed in patient's hard chart.  Will also scan copy into EMR/Vynca -Continue current care plan -Goal is for gentle medical interventions and medical optimization, improved quality of life.  [name] and son are interested in SNF placement upon discharge -Ongoing goals of care discussions pending clinical course -Psychosocial and emotional support provided -PMT will continue to follow and support  Prognosis:  Guarded  Discharge Planning: To Be Determined      Primary Diagnoses: Present on Admission:  Hip fracture (HCC)  Parkinson's disease (HCC)  Restless leg syndrome   Physical Exam Vitals and nursing note reviewed.  Constitutional:      General: She is not in acute distress.    Appearance: She is ill-appearing.  Cardiovascular:     Rate and Rhythm: Normal rate.  Skin:    General: Skin is warm and dry.  Neurological:     Mental Status: She is alert. She is disoriented.  Psychiatric:        Speech: Speech is slurred.        Behavior: Behavior is cooperative.        Cognition and Memory: Cognition is impaired.    Vital Signs: BP (!) 98/54   Pulse 95   Temp 98 F (36.7 C) (Oral)   Resp 18   Ht 5\' 2"  (1.575 m)   Wt 48.6 kg   SpO2 100%   BMI 19.60 kg/m  Pain Scale: PAINAD      SpO2: SpO2: 100 % O2 Device:SpO2: 100 % O2 Flow Rate: .O2 Flow Rate (L/min): 1 L/min   Palliative Assessment/Data: 30%     MDM: high   Shaleta Ruacho Jeni Salles, PA-C  Palliative  Medicine Team Team phone # [phone]  Thank you for allowing the Palliative Medicine Team to assist in the care of this patient. Please utilize secure chat with additional questions, if there is no response within 30 minutes please call the above phone number.  Palliative Medicine Team providers are available by phone from 7am to 7pm daily and can be reached through the team cell phone.  Should this patient require assistance outside of these hours, please call the patient's attending physician.

## 2022-12-25 NOTE — Progress Notes (Signed)
.  Subjective: 1 Day Post-Op Procedure(s) (LRB): INTRAMEDULLARY (IM) NAIL INTERTROCHANTERIC (Right)  Sitting up in bed. Pain well controlled. No events overnight  Activity level:  WBAT Diet tolerance:  as tolerated Voiding:  as tolerated Patient reports pain as mild.    Objective: Vital signs in last 24 hours: Temp:  [97.6 F (36.4 C)-98 F (36.7 C)] 98 F (36.7 C) (10/16 0900) Pulse Rate:  [83-96] 95 (10/16 0900) Resp:  [14-20] 18 (10/16 0900) BP: (89-116)/(54-77) 98/54 (10/16 0900) SpO2:  [92 %-100 %] 100 % (10/16 0900)  Labs: Recent Labs    12/23/22 1950 12/24/22 0612  HGB 11.8* 11.1*   Recent Labs    12/23/22 1950 12/24/22 0132 12/24/22 0612  WBC 19.2*  --  15.3*  RBC 4.18 4.03 3.98  HCT 37.1  --  34.4*  PLT 271  --  232   Recent Labs    12/23/22 1950  NA 141  K 4.6  CL 106  CO2 25  BUN 29*  CREATININE 0.66  GLUCOSE 128*  CALCIUM 9.4   No results for input(s): "LABPT", "INR" in the last 72 hours.  Physical Exam:  Neurologically at baseline Sensation intact distally Intact pulses distally Dorsiflexion/Plantar flexion intact Incision: dressing C/D/I  Assessment/Plan:  1 Day Post-Op Procedure(s) (LRB): INTRAMEDULLARY (IM) NAIL INTERTROCHANTERIC (Right)   Up with therapy, WBAT Pain control, recommend oral medications with IV medications as needed for severe pain Recommend aspirin 81mg  BID for DVT ppx Rest of management per medical team Likely discharge to SNF    Luci Bank 12/25/2022, 2:26 PM

## 2022-12-25 NOTE — Care Management Important Message (Signed)
Important Message  Patient Details  Name: Amy [name] MRN: [id number] Date of Birth: 10-Oct-1945   Important Message Given:  Yes - Medicare IM     Sherilyn Banker 12/25/2022, 4:36 PM

## 2022-12-25 NOTE — Plan of Care (Signed)
  Problem: Pain Managment: Goal: General experience of comfort will improve Outcome: Progressing   Problem: Safety: Goal: Ability to remain free from injury will improve Outcome: Progressing   Problem: Skin Integrity: Goal: Risk for impaired skin integrity will decrease Outcome: Progressing   

## 2022-12-25 NOTE — Evaluation (Addendum)
Physical Therapy Evaluation Patient Details Name: Amy [name] MRN: [id number] DOB: Apr 25, 1945 Today's Date: 12/25/2022  History of Present Illness  77 y.o. female  admitted 10/14 after fall at home where she sustained right hip fx. IM nail on 10/15.  PMH: advanced Parkinson's disease, dementia, Hashimoto's thyroiditis, hypothyroidism, osteoporosis, RLS, PVCs  Clinical Impression  Pt admitted with above diagnosis. Pt was able to sit eOB needing +2 total assist to come to eOB and +2 max assist initially to sit progressing to CGA to sit EOB. Pt will need assist x 24 hours at home and if family cannot provide this, will need post acute therapy < 3 hours day. Will follow acutely.  Pt currently with functional limitations due to the deficits listed below (see PT Problem List). Pt will benefit from acute skilled PT to increase their independence and safety with mobility to allow discharge.           If plan is discharge home, recommend the following: Two people to help with walking and/or transfers;Two people to help with bathing/dressing/bathroom;Assistance with cooking/housework;Assist for transportation;Help with stairs or ramp for entrance;Supervision due to cognitive status   Can travel by private vehicle   No    Equipment Recommendations Other (comment) (TBA)  Recommendations for Other Services       Functional Status Assessment Patient has had a recent decline in their functional status and demonstrates the ability to make significant improvements in function in a reasonable and predictable amount of time.     Precautions / Restrictions Precautions Precautions: Fall Precaution Comments: watch BP Restrictions Weight Bearing Restrictions: Yes RLE Weight Bearing: Weight bearing as tolerated      Mobility  Bed Mobility Overal bed mobility: Needs Assistance Bed Mobility: Supine to Sit, Sit to Supine     Supine to sit: Total assist, +2 for physical assistance Sit to supine: Total  assist, +2 for physical assistance   General bed mobility comments: Pt needed total assist +2 to asssist to EOB with incr pain right LE with movement.    Transfers                   General transfer comment: Did not test as pt needing assist to sit EOB much of the session    Ambulation/Gait                  Stairs            Wheelchair Mobility     Tilt Bed    Modified Rankin (Stroke Patients Only)       Balance Overall balance assessment: Needs assistance Sitting-balance support: No upper extremity supported, Feet supported, Bilateral upper extremity supported, Single extremity supported Sitting balance-Leahy Scale: Zero Sitting balance - Comments: Initially pt was max assist of 2 to sit up on EOB but with time progressed to CGA.  Pt needs UE support at all times and took incr time to gain posture at EOB. Pt sat a total of 10 min at EOB and was able to wash her face with cues. Postural control: Posterior lean                                   Pertinent Vitals/Pain Pain Assessment Pain Assessment: Faces Faces Pain Scale: Hurts even more Pain Location: right hip Pain Descriptors / Indicators: Aching, Discomfort, Grimacing, Guarding Pain Intervention(s): Limited activity within patient's tolerance, Monitored during session, Repositioned  Home Living Family/patient expects to be discharged to:: Private residence Living Arrangements: Spouse/significant other Available Help at Discharge: Available 24 hours/day               Additional Comments: pt unable to report    Prior Function Prior Level of Function : Patient poor historian/Family not available;Needs assist             Mobility Comments: pt reports using RW, per chart spouse assisting pt with mobility ADLs Comments: pt reports spouse assists with ADLs     Extremity/Trunk Assessment   Upper Extremity Assessment Upper Extremity Assessment: Defer to OT evaluation     Lower Extremity Assessment Lower Extremity Assessment: RLE deficits/detail;LLE deficits/detail RLE Deficits / Details: did pump ankle to command but did not move LE other than that, P.ROM limited by pain LLE Deficits / Details: would not move to command    Cervical / Trunk Assessment Cervical / Trunk Assessment: Kyphotic  Communication   Communication Communication: Difficulty following commands/understanding;Difficulty communicating thoughts/reduced clarity of speech Following commands: Follows one step commands inconsistently Cueing Techniques: Verbal cues;Gestural cues;Tactile cues;Visual cues  Cognition Arousal: Alert Behavior During Therapy: Flat affect Overall Cognitive Status: History of cognitive impairments - at baseline                                 General Comments: hx of dementia, pt talks throughout eval however unrelated information, cannot answer questions, Oriented to self only        General Comments General comments (skin integrity, edema, etc.): VSS with BP somewhat soft without c/o dizziness    Exercises General Exercises - Lower Extremity Ankle Circles/Pumps: AROM, Both, 10 reps, Supine Long Arc Quad: AAROM, Both, 5 reps, Seated Heel Slides: AAROM, Right, 5 reps, Supine   Assessment/Plan    PT Assessment Patient needs continued PT services  PT Problem List Decreased activity tolerance;Decreased range of motion;Decreased strength;Decreased balance;Decreased mobility;Decreased knowledge of use of DME;Decreased cognition;Decreased safety awareness;Decreased knowledge of precautions;Pain       PT Treatment Interventions DME instruction;Gait training;Functional mobility training;Therapeutic activities;Therapeutic exercise;Balance training;Patient/family education;Wheelchair mobility training    PT Goals (Current goals can be found in the Care Plan section)  Acute Rehab PT Goals Patient Stated Goal: pt unable to state PT Goal Formulation:  Patient unable to participate in goal setting Time For Goal Achievement: 01/08/23 Potential to Achieve Goals: Fair    Frequency Min 1X/week     Co-evaluation   Reason for Co-Treatment: For patient/therapist safety;To address functional/ADL transfers;Necessary to address cognition/behavior during functional activity  PT goals addressed: Safety with mobility OT goals addressed during session: ADL's and self-care       AM-PAC PT "6 Clicks" Mobility  Outcome Measure Help needed turning from your back to your side while in a flat bed without using bedrails?: Total Help needed moving from lying on your back to sitting on the side of a flat bed without using bedrails?: Total Help needed moving to and from a bed to a chair (including a wheelchair)?: Total Help needed standing up from a chair using your arms (e.g., wheelchair or bedside chair)?: Total Help needed to walk in hospital room?: Total Help needed climbing 3-5 steps with a railing? : Total 6 Click Score: 6    End of Session Equipment Utilized During Treatment: Gait belt Activity Tolerance: Patient limited by fatigue;Patient limited by pain Patient left: in bed;with call bell/phone within reach;with bed alarm  set Nurse Communication: Mobility status;Need for lift equipment PT Visit Diagnosis: Muscle weakness (generalized) (M62.81);Unsteadiness on feet (R26.81);History of falling (Z91.81)    Time: 1610-9604 PT Time Calculation (min) (ACUTE ONLY): 24 min   Charges:   PT Evaluation $PT Eval Moderate Complexity: 1 Mod   PT General Charges $$ ACUTE PT VISIT: 1 Visit         Lashonta Pilling M,PT Acute Rehab Services [phone]   Bevelyn Buckles 12/25/2022, 1:40 PM

## 2022-12-26 DIAGNOSIS — Z515 Encounter for palliative care: Secondary | ICD-10-CM | POA: Diagnosis not present

## 2022-12-26 DIAGNOSIS — Z7189 Other specified counseling: Secondary | ICD-10-CM | POA: Diagnosis not present

## 2022-12-26 DIAGNOSIS — S72001D Fracture of unspecified part of neck of right femur, subsequent encounter for closed fracture with routine healing: Secondary | ICD-10-CM | POA: Diagnosis not present

## 2022-12-26 DIAGNOSIS — W19XXXA Unspecified fall, initial encounter: Secondary | ICD-10-CM | POA: Diagnosis not present

## 2022-12-26 DIAGNOSIS — G20A1 Parkinson's disease without dyskinesia, without mention of fluctuations: Secondary | ICD-10-CM | POA: Diagnosis not present

## 2022-12-26 DIAGNOSIS — S72001A Fracture of unspecified part of neck of right femur, initial encounter for closed fracture: Secondary | ICD-10-CM | POA: Diagnosis not present

## 2022-12-26 LAB — CBC
HCT: 25 % — ABNORMAL LOW (ref 36.0–46.0)
Hemoglobin: 8.1 g/dL — ABNORMAL LOW (ref 12.0–15.0)
MCH: 27.8 pg (ref 26.0–34.0)
MCHC: 32.4 g/dL (ref 30.0–36.0)
MCV: 85.9 fL (ref 80.0–100.0)
Platelets: 154 10*3/uL (ref 150–400)
RBC: 2.91 MIL/uL — ABNORMAL LOW (ref 3.87–5.11)
RDW: 14.3 % (ref 11.5–15.5)
WBC: 12.5 10*3/uL — ABNORMAL HIGH (ref 4.0–10.5)
nRBC: 0 % (ref 0.0–0.2)

## 2022-12-26 NOTE — TOC CAGE-AID Note (Signed)
Transition of Care [facility]) - CAGE-AID Screening   Patient Details  Name: Amy [name] MRN: [id number] Date of Birth: 01-Jun-1945  [facility]) CM/SW Contact:    Leota Sauers, RN Phone Number: 12/26/2022, 6:11 AM   Clinical Narrative:  Patient denies use of alcohol and illicit drugs. Resources not given at this time.  CAGE-AID Screening:    Have You Ever Felt You Ought to Cut Down on Your Drinking or Drug Use?: No Have People Annoyed You By Critizing Your Drinking Or Drug Use?: No Have You Felt Bad Or Guilty About Your Drinking Or Drug Use?: No Have You Ever Had a Drink or Used Drugs First Thing In The Morning to Steady Your Nerves or to Get Rid of a Hangover?: No CAGE-AID Score: 0  Substance Abuse Education Offered: No

## 2022-12-26 NOTE — Progress Notes (Signed)
RE:  Amy [name]       Date of Birth:  February 21, 2046     Date:   12/26/22       To Whom It May Concern:  Please be advised that the above-named patient will require a short-term nursing home stay - anticipated 30 days or less for rehabilitation and strengthening.  The plan is for return home.                 MD signature                Date

## 2022-12-26 NOTE — Plan of Care (Signed)
Problem: Education: Goal: Knowledge of General Education information will improve Description: Including pain rating scale, medication(s)/side effects and non-pharmacologic comfort measures Outcome: Not Progressing   Problem: Health Behavior/Discharge Planning: Goal: Ability to manage health-related needs will improve Outcome: Not Progressing   Problem: Clinical Measurements: Goal: Ability to maintain clinical measurements within normal limits will improve Outcome: Progressing Goal: Will remain free from infection Outcome: Progressing Goal: Diagnostic test results will improve Outcome: Progressing Goal: Respiratory complications will improve Outcome: Progressing Goal: Cardiovascular complication will be avoided Outcome: Progressing   Problem: Activity: Goal: Risk for activity intolerance will decrease Outcome: Not Progressing   Problem: Nutrition: Goal: Adequate nutrition will be maintained Outcome: Progressing   Problem: Coping: Goal: Level of anxiety will decrease Outcome: Progressing   Problem: Elimination: Goal: Will not experience complications related to bowel motility Outcome: Progressing Goal: Will not experience complications related to urinary retention Outcome: Progressing   Problem: Pain Managment: Goal: General experience of comfort will improve Outcome: Progressing   Problem: Safety: Goal: Ability to remain free from injury will improve Outcome: Progressing   Problem: Skin Integrity: Goal: Risk for impaired skin integrity will decrease Outcome: Progressing

## 2022-12-26 NOTE — NC FL2 (Signed)
MEDICAID FL2 LEVEL OF CARE FORM     IDENTIFICATION  Patient Name: Amy [name] Birthdate: 25-May-1945 Sex: female Admission Date (Current Location): 12/23/2022  [facility] and [place] Number:  Producer, television/film/video and Address:  [facility], [address] [place], [place] [address]      Provider Number: [id number]  Attending Physician Name and Address:  Loyce Dys, MD  Relative Name and Phone Number:  NEELIE, WELSHANS   [phone]    Current Level of Care: Hospital Recommended Level of Care: Skilled Nursing Facility Prior Approval Number:    Date Approved/Denied:   PASRR Number:    Discharge Plan: SNF    Current Diagnoses: Patient Active Problem List   Diagnosis Date Noted   Hypothyroidism 12/24/2022   Leukocytosis 12/24/2022   Dehydration 12/24/2022   Malnutrition of moderate degree 12/24/2022   Hip fracture (HCC) 12/23/2022   Restless leg syndrome 06/21/2019   Memory loss 01/29/2018   Parkinson's disease (HCC) 01/27/2017    Orientation RESPIRATION BLADDER Height & Weight     Self  Normal External catheter, Incontinent Weight: 107 lb 2.3 oz (48.6 kg) Height:  5\' 2"  (157.5 cm)  BEHAVIORAL SYMPTOMS/MOOD NEUROLOGICAL BOWEL NUTRITION STATUS      Incontinent Diet (see discharge summary)  AMBULATORY STATUS COMMUNICATION OF NEEDS Skin   Total Care Verbally Surgical wounds                       Personal Care Assistance Level of Assistance  Bathing, Feeding, Dressing, Total care Bathing Assistance: Maximum assistance Feeding assistance: Maximum assistance Dressing Assistance: Maximum assistance Total Care Assistance: Maximum assistance   Functional Limitations Info  Sight, Hearing, Speech          SPECIAL CARE FACTORS FREQUENCY  PT (By licensed PT), OT (By licensed OT)     PT Frequency: 5x week OT Frequency: 5x week            Contractures Contractures Info: Not present    Additional Factors Info   Code Status, Allergies Code Status Info: DNR Allergies Info: Ciprofloxacin           Current Medications (12/26/2022):  This is the current hospital active medication list Current Facility-Administered Medications  Medication Dose Route Frequency Provider Last Rate Last Admin   acetaminophen (TYLENOL) tablet 650 mg  650 mg Oral Q6H PRN John Giovanni, MD       amantadine (SYMMETREL) capsule 100 mg  100 mg Oral TID John Giovanni, MD   100 mg at 12/26/22 0817   carbidopa-levodopa (SINEMET IR) 25-100 MG per tablet immediate release 1 tablet  1 tablet Oral 8 times per day John Giovanni, MD   1 tablet at 12/26/22 0819   feeding supplement (ENSURE ENLIVE / ENSURE PLUS) liquid 237 mL  237 mL Oral BID BM Rosezetta Schlatter T, MD   237 mL at 12/26/22 0819   levothyroxine (SYNTHROID) tablet 75 mcg  75 mcg Oral Q0600 John Giovanni, MD   75 mcg at 12/26/22 0520   morphine (PF) 2 MG/ML injection 1 mg  1 mg Intravenous Q4H PRN John Giovanni, MD   1 mg at 12/25/22 0251   multivitamin with minerals tablet 1 tablet  1 tablet Oral Daily Loyce Dys, MD   1 tablet at 12/26/22 0817   naloxone (NARCAN) injection 0.4 mg  0.4 mg Intravenous PRN John Giovanni, MD       oxyCODONE (Oxy IR/ROXICODONE) immediate release tablet 5  mg  5 mg Oral Q6H PRN John Giovanni, MD       pramipexole (MIRAPEX) tablet 1 mg  1 mg Oral TID John Giovanni, MD   1 mg at 12/26/22 1610     Discharge Medications: Please see discharge summary for a list of discharge medications.  Relevant Imaging Results:  Relevant Lab Results:   Additional Information SSN: [id number]  Lorri Frederick, LCSW

## 2022-12-26 NOTE — Progress Notes (Signed)
.  Subjective: 2 Days Post-Op Procedure(s) (LRB): INTRAMEDULLARY (IM) NAIL INTERTROCHANTERIC (Right) Sitting up in bed. Pain well controlled. No events overnight   Activity level:  WBAT Diet tolerance:  as tolerated Voiding:  as tolerated Patient reports pain as mild.    Objective: Vital signs in last 24 hours: Temp:  [98 F (36.7 C)-98.1 F (36.7 C)] 98 F (36.7 C) (10/17 0348) Pulse Rate:  [88-97] 97 (10/17 0348) Resp:  [18] 18 (10/17 0348) BP: (93-103)/(53-86) 95/60 (10/17 1130) SpO2:  [97 %-100 %] 99 % (10/17 0348)  Labs: Recent Labs    12/23/22 1950 12/24/22 0612 12/26/22 0712  HGB 11.8* 11.1* 8.1*   Recent Labs    12/24/22 0612 12/26/22 0712  WBC 15.3* 12.5*  RBC 3.98 2.91*  HCT 34.4* 25.0*  PLT 232 154   Recent Labs    12/23/22 1950  NA 141  K 4.6  CL 106  CO2 25  BUN 29*  CREATININE 0.66  GLUCOSE 128*  CALCIUM 9.4   No results for input(s): "LABPT", "INR" in the last 72 hours.  Physical Exam:  Neurologically at baseline Sensation intact distally Intact pulses distally Dorsiflexion/Plantar flexion intact Incision: dressing C/D/I    Assessment/Plan:  2 Days Post-Op Procedure(s) (LRB): INTRAMEDULLARY (IM) NAIL INTERTROCHANTERIC (Right)  Up with therapy, WBAT Pain control, recommend oral medications with IV medications as needed for severe pain Recommend aspirin 81mg  BID for DVT ppx Rest of management per medical team Likely discharge to SNF    Luci Bank 12/26/2022, 2:20 PM

## 2022-12-26 NOTE — Progress Notes (Signed)
Daily Progress Note   Patient Name: Amy [name]       Date: 12/26/2022 DOB: 1945/08/28  Age: 77 y.o. MRN#: [id number] Attending Physician: Loyce Dys, MD Primary Care Physician: Georgann Housekeeper, MD Admit Date: 12/23/2022  Reason for Consultation/Follow-up: Establishing goals of care  Subjective: Medical records reviewed including progress notes, labs, and imaging. Patient assessed at the bedside. She remains confused, difficult to understand. Discussed with RN. [name] and [name] are present visiting.  Family was concerned that patient has not been eating much and I provided reassurance on the multiple attempts to feed her per RN. She is now stating that she is hungry and willing to eat small bites for family. They note that she typically has a good appetite and we discussed that her acute illness is likely a factor, with pain and confusion in her different environment. Provided education on use of mittens.  Goal remains to discharge to SNF when ready and I encouraged family to tour facilities that present bed offers. Husband is concerned that he knows nothing about them while still being unable to care for her at her current level. Recommended completion of a MOST form, broadly covering concepts specific to code status, artifical feeding and hydration, continued IV antibiotics and rehospitalization.  Family would like to review further and complete tomorrow at the earliest.   Questions and concerns addressed. PMT will continue to support holistically.   Length of Stay: 3   Physical Exam Vitals and nursing note reviewed.  Constitutional:      General: She is not in acute distress.    Appearance: She is ill-appearing.  Cardiovascular:     Rate and Rhythm: Normal rate.   Pulmonary:     Effort: Pulmonary effort is normal.  Neurological:     Mental Status: She is alert. She is disoriented.  Psychiatric:        Mood and Affect: Mood normal.        Cognition and Memory: Cognition is impaired.            Vital Signs: BP 103/67 (BP Location: Right Arm)   Pulse 97   Temp 98 F (36.7 C)   Resp 18   Ht 5\' 2"  (1.575 m)   Wt 48.6 kg   SpO2 99%   BMI 19.60 kg/m  SpO2: SpO2: 99 % O2 Device: O2 Device: Room Air O2 Flow Rate: O2 Flow Rate (L/min): 1 L/min      Palliative Assessment/Data: 20-30%   Palliative Care Assessment & Plan   Patient Profile: 77 y.o. female  with past medical history of advanced Parkinson's disease, dementia, Hashimoto's thyroiditis, hypothyroidism, osteoporosis, RLS, PVCs admitted on 12/23/2022 with unwitnessed fall at home.    X-ray of hip/pelvis showing acute intertrochanteric fracture of the proximal right femur. She is admitted for futher management including surgical repair   PMT has been consulted POD 1 to assist with goals of care conversation  Assessment: Goals of care conversation Acute intertrochanteric fracture of the proximal right femur S/P right hip intramedullary nailing  Advanced Parkinson's disease/dementia  Recommendations/Plan: Continue DNR/DNI Continue current care plan MOST form introduced today. Will follow up with family tomorrow for completion at their request Psychosocial and emotional support provided Goal remains SNF for rehab PMT will continue to follow and support   Prognosis:  Guarded, high risk for further decline  Discharge Planning: SNF  Care plan was discussed with patient, patient's husband and son, RN   Total time: I spent 50 minutes in the care of the patient today in the above activities and documenting the encounter.         Royden Bulman Jeni Salles, PA-C  Palliative Medicine Team Team phone # [phone]  Thank you for allowing the Palliative Medicine Team to assist in  the care of this patient. Please utilize secure chat with additional questions, if there is no response within 30 minutes please call the above phone number.  Palliative Medicine Team providers are available by phone from 7am to 7pm daily and can be reached through the team cell phone.  Should this patient require assistance outside of these hours, please call the patient's attending physician.

## 2022-12-26 NOTE — Progress Notes (Signed)
Progress Note   Patient: Amy [name] ZOX:[id number] DOB: [birth year] DOA: 12/23/2022     3 DOS: the patient was seen and examined on 12/26/2022     Subjective:  Seen and examined at bedside this morning Admits to improvement in hip pain Exhibiting symptoms of dementia Denies any nausea vomiting or chest pain   Brief hospital course: From HPI "Amy [name] is a 77 y.o. female with medical history significant of advanced Parkinson's disease, dementia, Hashimoto's thyroiditis, hypothyroidism, osteoporosis, RLS, PVCs presented to ED via EMS after an unwitnessed fall at home.  Family reported that patient was found on the ground.  She was hemodynamically stable with EMS.  Complained of right hip pain.  Labs notable for WBC 19.2, hemoglobin 11.8 (previously >14 on labs done over 3 years ago), MCV 88.8.  X-ray of hip/pelvis showing acute intertrochanteric fracture of the proximal right femur.  Chest x-ray showing no acute cardiopulmonary disease.  CT head/C-spine negative for acute findings.  EDP discussed the case with Dr. Hulda Humphrey, orthopedics will consult.  TRH called to admit.   Patient has advanced dementia, nonverbal.  History provided by her husband and son at bedside.  Husband states he stays with the patient 24 hours a day due to her advanced Parkinson's/dementia.  Today he stepped away from her for a few minutes when he heard her fall.  When he arrived, she was on the ground screaming in pain.  Other than the fall today, she has been at baseline.  No fevers, cough, shortness of breath, vomiting, diarrhea, or abdominal pain reported by family."     Assessment and Plan:   Acute intertrochanteric fracture of the proximal right femur Patient is status post right hip intramedullary nailing done by orthopedics on 12/24/2022 Continue as needed pain management Continue PT OT Orthopedics on board we appreciate input TOC manager working on placement Continue to monitor pain scale    ACute on  chronic anemia likely postoperative No indication for blood transfusion at this time Continue to monitor CBC closely  Leukocytosis secondary to urinary tract infection Continue current antibiotics   Dehydration She has dry mucous membranes and BUN slightly elevated on labs.   Continue oral hydration   Advanced Parkinson's disease/dementia Continue amantadine and carbidopa-levodopa.   Continue delirium precautions. Palliative care on board we appreciate input   Hypothyroidism Continue levothyroxine   Restless leg syndrome Continue pramipexole.   History of PVCs Continue cardiac monitoring   DVT prophylaxis: SCDs   Code Status: DNI DNR     Consults called: Orthopedics   Level of care: Telemetry bed     Physical Exam: ENT:     Head: Normocephalic and atraumatic.     Mouth/Throat:     Mouth: Mucous membranes are dry.     Comments: Dry mucous membranes Eyes:     Extraocular Movements: Extraocular movements intact.  Cardiovascular:     Rate and Rhythm: Normal rate and regular rhythm.     Pulses: Normal pulses.  Pulmonary:     Effort: Pulmonary effort is normal. No respiratory distress.     Breath sounds: Normal breath sounds. No wheezing or rales.  Abdominal:     General: Bowel sounds are normal. There is no distension.     Palpations: Abdomen is soft.     Tenderness: There is no abdominal tenderness.  Musculoskeletal:     Cervical back: Normal range of motion.     Right lower leg: No edema.     Left lower leg: No edema.  Comments: Dressing noted to the hip Skin:    General: Skin is warm and dry.  Neurological:     Mental Status: She is alert. Mental status is at baseline.      Data Reviewed: I have reviewed patient lab reports below as well as vitals, I have reviewed orthopedics documentation, TOC manager documentation as well as palliative care notes  Family Communication: Husband and son updated   Disposition: Status is: Inpatient    Planned  Discharge Destination: Pending orthopedic clearance as well as PT OT commendation, I anticipate patient will need rehab     Time spent: 45 minutes      Latest Ref Rng & Units 12/26/2022    7:12 AM 12/24/2022    6:12 AM 12/23/2022    7:50 PM  CBC  WBC 4.0 - 10.5 K/uL 12.5  15.3  19.2   Hemoglobin 12.0 - 15.0 g/dL 8.1  81.1  91.4   Hematocrit 36.0 - 46.0 % 25.0  34.4  37.1   Platelets 150 - 400 K/uL 154  232  271        Latest Ref Rng & Units 12/23/2022    7:50 PM 07/29/2018   11:58 AM 06/04/2018    5:17 PM  BMP  Glucose 70 - 99 mg/dL 782  92  98   BUN 8 - 23 mg/dL 29  14  15    Creatinine 0.44 - 1.00 mg/dL 9.56  2.13  0.86   Sodium 135 - 145 mmol/L 141  138  137   Potassium 3.5 - 5.1 mmol/L 4.6  4.2  5.0   Chloride 98 - 111 mmol/L 106  102  105   CO2 22 - 32 mmol/L 25  21  23    Calcium 8.9 - 10.3 mg/dL 9.4  9.5  8.6      Vitals:   12/25/22 1448 12/25/22 2018 12/26/22 0348 12/26/22 1130  BP: 102/86 (!) 93/53 103/67 95/60  Pulse: 88 88 97   Resp:  18 18   Temp: 98.1 F (36.7 C) 98 F (36.7 C) 98 F (36.7 C)   TempSrc: Oral Oral    SpO2: 100% 97% 99%   Weight:      Height:        Author: Loyce Dys, MD 12/26/2022 3:58 PM  For on call review [email or web address].

## 2022-12-26 NOTE — TOC Initial Note (Addendum)
Transition of Care [facility]) - Initial/Assessment Note    Patient Details  Name: Amy [name] MRN: [id number] Date of Birth: 1945/06/14  [facility]) CM/SW Contact:    Lorri Frederick, LCSW Phone Number: 12/26/2022, 11:53 AM  Clinical Narrative:   Pt oriented x1, CSW spoke with husband  Amy [name] by phone regarding PT recommendations for SNF.  Amy [name] agreeable to this, will be at the hospital later today, medicare choice document placed in pt room for him to review.  Pt from home with Amy [name] and one adult son.  No current services.  Referral sent out in hub for SNF. Additional info uploaded in [place] must for PASSR.           1400: PASSR received: [id number]  Expected Discharge Plan: Skilled Nursing Facility Barriers to Discharge: Continued Medical Work up, SNF Pending bed offer   Patient Goals and CMS Choice   CMS Medicare.gov Compare Post Acute Care list provided to:: Patient Represenative (must comment) (husband) Choice offered to / list presented to : Spouse      Expected Discharge Plan and Services In-house Referral: Clinical Social Work     Living arrangements for the past 2 months: Single Family Home                                      Prior Living Arrangements/Services Living arrangements for the past 2 months: Single Family Home Lives with:: Spouse, Adult Children Patient language and need for interpreter reviewed:: Yes        Need for Family Participation in Patient Care: Yes (Comment) Care giver support system in place?: Yes (comment) Current home services: Other (comment) (none) Criminal Activity/Legal Involvement Pertinent to Current Situation/Hospitalization: No - Comment as needed  Activities of Daily Living      Permission Sought/Granted                  Emotional Assessment Appearance:: Appears stated age Attitude/Demeanor/Rapport: Unable to Assess Affect (typically observed): Unable to Assess Orientation: : Oriented  to Self      Admission diagnosis:  Hip fracture (HCC) [S72.009A] Fall, initial encounter [W19.XXXA] Closed fracture of right hip, initial encounter [facility]) [S72.001A] Patient Active Problem List   Diagnosis Date Noted   Hypothyroidism 12/24/2022   Leukocytosis 12/24/2022   Dehydration 12/24/2022   Malnutrition of moderate degree 12/24/2022   Hip fracture (HCC) 12/23/2022   Restless leg syndrome 06/21/2019   Memory loss 01/29/2018   Parkinson's disease (HCC) 01/27/2017   PCP:  Georgann Housekeeper, MD Pharmacy:   [facility] [phone] - , [place] - [address] AT [address] OF [facility] [address] [place] [address] Phone: [phone] Fax: [phone]     Social Determinants of Health (SDOH) Social History: SDOH Screenings   Tobacco Use: Low Risk  (12/24/2022)   SDOH Interventions:     Readmission Risk Interventions     No data to display

## 2022-12-26 NOTE — Plan of Care (Signed)
Problem: Clinical Measurements: Goal: Will remain free from infection Outcome: Progressing Goal: Diagnostic test results will improve Outcome: Progressing Goal: Respiratory complications will improve Outcome: Progressing Goal: Cardiovascular complication will be avoided Outcome: Progressing   Problem: Coping: Goal: Level of anxiety will decrease Outcome: Progressing   Problem: Elimination: Goal: Will not experience complications related to bowel motility Outcome: Progressing Goal: Will not experience complications related to urinary retention Outcome: Progressing   Problem: Safety: Goal: Ability to remain free from injury will improve Outcome: Progressing   Problem: Skin Integrity: Goal: Risk for impaired skin integrity will decrease Outcome: Progressing

## 2022-12-27 DIAGNOSIS — S72001A Fracture of unspecified part of neck of right femur, initial encounter for closed fracture: Secondary | ICD-10-CM | POA: Diagnosis not present

## 2022-12-27 DIAGNOSIS — G20A1 Parkinson's disease without dyskinesia, without mention of fluctuations: Secondary | ICD-10-CM | POA: Diagnosis not present

## 2022-12-27 DIAGNOSIS — Z515 Encounter for palliative care: Secondary | ICD-10-CM | POA: Diagnosis not present

## 2022-12-27 DIAGNOSIS — Z7189 Other specified counseling: Secondary | ICD-10-CM | POA: Diagnosis not present

## 2022-12-27 DIAGNOSIS — W19XXXA Unspecified fall, initial encounter: Secondary | ICD-10-CM | POA: Diagnosis not present

## 2022-12-27 DIAGNOSIS — S72001D Fracture of unspecified part of neck of right femur, subsequent encounter for closed fracture with routine healing: Secondary | ICD-10-CM | POA: Diagnosis not present

## 2022-12-27 LAB — CBC
HCT: 23.8 % — ABNORMAL LOW (ref 36.0–46.0)
Hemoglobin: 7.6 g/dL — ABNORMAL LOW (ref 12.0–15.0)
MCH: 27.6 pg (ref 26.0–34.0)
MCHC: 31.9 g/dL (ref 30.0–36.0)
MCV: 86.5 fL (ref 80.0–100.0)
Platelets: 186 10*3/uL (ref 150–400)
RBC: 2.75 MIL/uL — ABNORMAL LOW (ref 3.87–5.11)
RDW: 14.4 % (ref 11.5–15.5)
WBC: 12.6 10*3/uL — ABNORMAL HIGH (ref 4.0–10.5)
nRBC: 0 % (ref 0.0–0.2)

## 2022-12-27 LAB — PREPARE RBC (CROSSMATCH)

## 2022-12-27 MED ORDER — SODIUM CHLORIDE 0.9% IV SOLUTION: Freq: Once | INTRAVENOUS | Status: AC

## 2022-12-27 NOTE — Plan of Care (Signed)
Not alert and oriented.  Blood consent obtained from husband.   Problem: Education: Goal: Knowledge of General Education information will improve Description: Including pain rating scale, medication(s)/side effects and non-pharmacologic comfort measures Outcome: Progressing   Problem: Health Behavior/Discharge Planning: Goal: Ability to manage health-related needs will improve Outcome: Progressing   Problem: Clinical Measurements: Goal: Ability to maintain clinical measurements within normal limits will improve Outcome: Progressing Goal: Will remain free from infection Outcome: Progressing

## 2022-12-27 NOTE — TOC Progression Note (Signed)
Transition of Care [facility]) - Progression Note    Patient Details  Name: Amy [name] MRN: [id number] Date of Birth: 07/01/1945  [facility]) CM/SW Contact  Lorri Frederick, LCSW Phone Number: 12/27/2022, 8:43 AM  Clinical Narrative:   Medicare payer.  Inpatient order: 12/23/22.    Expected Discharge Plan: Skilled Nursing Facility Barriers to Discharge: Continued Medical Work up, SNF Pending bed offer  Expected Discharge Plan and Services In-house Referral: Clinical Social Work     Living arrangements for the past 2 months: Single Family Home                                       Social Determinants of Health (SDOH) Interventions SDOH Screenings   Tobacco Use: Low Risk  (12/24/2022)    Readmission Risk Interventions     No data to display

## 2022-12-27 NOTE — Progress Notes (Addendum)
Pt spit out meds last night. Pt was encouraged several times to swallow, but does not fallow commands. During repositioning, she does get agitated and start swinging. Mittens on. Pt slept on/off through out night, appears withdrawn

## 2022-12-27 NOTE — Progress Notes (Signed)
.  Subjective: 3 Days Post-Op Procedure(s) (LRB): INTRAMEDULLARY (IM) NAIL INTERTROCHANTERIC (Right)  Sitting up in bed. Pain well controlled. Hgb down to 7.6, getting 1u pRBCs today   Activity level:  WBAT Diet tolerance:  as tolerated Voiding:  as tolerated Patient reports pain as mild.    Objective: Vital signs in last 24 hours: Temp:  [98.4 F (36.9 C)] 98.4 F (36.9 C) (10/18 0930) Pulse Rate:  [85] 85 (10/18 0930) Resp:  [20] 20 (10/18 0930) BP: (101)/(57) 101/57 (10/18 0930) SpO2:  [96 %] 96 % (10/18 0930)  Labs: Recent Labs    12/26/22 0712 12/27/22 0807  HGB 8.1* 7.6*   Recent Labs    12/26/22 0712 12/27/22 0807  WBC 12.5* 12.6*  RBC 2.91* 2.75*  HCT 25.0* 23.8*  PLT 154 186   No results for input(s): "NA", "K", "CL", "CO2", "BUN", "CREATININE", "GLUCOSE", "CALCIUM" in the last 72 hours. No results for input(s): "LABPT", "INR" in the last 72 hours.  Physical Exam:  Neurologically at baseline Sensation intact distally Intact pulses distally Dorsiflexion/Plantar flexion intact Incision: dressing C/D/I  Assessment/Plan:  3 Days Post-Op Procedure(s) (LRB): INTRAMEDULLARY (IM) NAIL INTERTROCHANTERIC (Right)   Plan: Up with therapy, WBAT Hgb at 7.6, Plan to transfuse 1u pRBCs, likely acute on chronic blood loss anemia from fracture and surgery. No signs of hematoma or active bleeding at surgical site Pain control, recommend oral medications with IV medications as needed for severe pain Recommend aspirin 81mg  BID for DVT ppx Rest of management per medical team Likely discharge to SNF    Luci Bank 12/27/2022, 2:38 PM

## 2022-12-27 NOTE — Progress Notes (Signed)
Occupational Therapy Treatment Patient Details Name: Amy [name] MRN: [id number] DOB: May 27, 1945 Today's Date: 12/27/2022   History of present illness 77 y.o. female  admitted 10/14 after fall at home where she sustained right hip fx. IM nail on 10/15.  PMH: advanced Parkinson's disease, dementia, Hashimoto's thyroiditis, hypothyroidism, osteoporosis, RLS, PVCs   OT comments  Patient supine in bed, soiled with urine and assisted with gown, bed pad and mesh underwear change with total assist +2. Requires +2 to sit EOB with total assist +2. Following simple commands with increased time but difficult to understand with mumbled soft speech.  BP remains soft but stable. Pt limited by pain in R hip with all movement and ADLs.  Will follow acutely, continue to recommend inpatient setting with <3hrs/day at dc.        If plan is discharge home, recommend the following:  Two people to help with walking and/or transfers;Two people to help with bathing/dressing/bathroom;Other (comment) (total care)   Equipment Recommendations  Hospital bed;BSC/3in1;Wheelchair (measurements OT);Wheelchair cushion (measurements OT)    Recommendations for Other Services      Precautions / Restrictions Precautions Precautions: Fall Precaution Comments: watch BP Restrictions Weight Bearing Restrictions: Yes RLE Weight Bearing: Weight bearing as tolerated       Mobility Bed Mobility Overal bed mobility: Needs Assistance Bed Mobility: Supine to Sit, Sit to Supine     Supine to sit: Total assist, +2 for physical assistance Sit to supine: Total assist, +2 for physical assistance   General bed mobility comments: Pt needed total assist +2 to asssist to EOB with incr pain right LE with movement.    Transfers                   General transfer comment: Attempted to stand pt with +2 total A however pt only able to clear hips a few inches before requesting to sit down.     Balance Overall balance  assessment: Needs assistance Sitting-balance support: No upper extremity supported, Feet supported, Bilateral upper extremity supported, Single extremity supported Sitting balance-Leahy Scale: Poor Sitting balance - Comments: posterior lean and relies on at least min assist external support Postural control: Posterior lean                                 ADL either performed or assessed with clinical judgement   ADL Overall ADL's : Needs assistance/impaired                     Lower Body Dressing: Total assistance;+2 for physical assistance;Bed level     Toilet Transfer Details (indicate cue type and reason): deferred Toileting- Clothing Manipulation and Hygiene: Total assistance;+2 for physical assistance;Bed level       Functional mobility during ADLs: Total assistance;+2 for physical assistance      Extremity/Trunk Assessment              Vision       Perception     Praxis      Cognition Arousal: Alert Behavior During Therapy: Flat affect Overall Cognitive Status: History of cognitive impairments - at baseline                                 General Comments: hx of dementia, pt talks very softly and tended to ramble, Oriented to self only  Exercises      Shoulder Instructions       General Comments BP soft but stable    Pertinent Vitals/ Pain       Pain Assessment Pain Assessment: Faces Faces Pain Scale: Hurts whole lot Pain Location: right hip Pain Descriptors / Indicators: Aching, Discomfort, Grimacing, Guarding Pain Intervention(s): Limited activity within patient's tolerance, Monitored during session, Repositioned  Home Living                                          Prior Functioning/Environment              Frequency  Min 1X/week        Progress Toward Goals  OT Goals(current goals can now be found in the care plan section)  Progress towards OT goals: Progressing  toward goals  Acute Rehab OT Goals Patient Stated Goal: unable to state OT Goal Formulation: Patient unable to participate in goal setting Time For Goal Achievement: 01/08/23 Potential to Achieve Goals: Fair  Plan      Co-evaluation    PT/OT/SLP Co-Evaluation/Treatment: Yes Reason for Co-Treatment: For patient/therapist safety;To address functional/ADL transfers;Necessary to address cognition/behavior during functional activity PT goals addressed during session: Mobility/safety with mobility OT goals addressed during session: ADL's and self-care      AM-PAC OT "6 Clicks" Daily Activity     Outcome Measure   Help from another person eating meals?: A Lot Help from another person taking care of personal grooming?: A Lot Help from another person toileting, which includes using toliet, bedpan, or urinal?: Total Help from another person bathing (including washing, rinsing, drying)?: Total Help from another person to put on and taking off regular upper body clothing?: Total Help from another person to put on and taking off regular lower body clothing?: Total 6 Click Score: 8    End of Session    OT Visit Diagnosis: Other abnormalities of gait and mobility (R26.89);Muscle weakness (generalized) (M62.81);Pain Pain - Right/Left: Right Pain - part of body: Leg;Hip   Activity Tolerance Patient tolerated treatment well   Patient Left in bed;with call bell/phone within reach;with bed alarm set;with SCD's reapplied   Nurse Communication Mobility status;Other (comment) (pain)        Time: 8295-6213 OT Time Calculation (min): 27 min  Charges: OT General Charges $OT Visit: 1 Visit OT Treatments $Self Care/Home Management : 8-22 mins  Barry Brunner, OT Acute Rehabilitation Services Office [phone]   Chancy Milroy 12/27/2022, 10:56 AM

## 2022-12-27 NOTE — Progress Notes (Signed)
Physical Therapy Treatment Patient Details Name: Darnecia Beem MRN: [id number] DOB: [birth year] Today's Date: 12/27/2022   History of Present Illness 77 y.o. female  admitted 10/14 after fall at home where she sustained right hip fx. IM nail on 10/15.  PMH: advanced Parkinson's disease, dementia, Hashimoto's thyroiditis, hypothyroidism, osteoporosis, RLS, PVCs    PT Comments  Pt tolerated treatment well today. Co-treat with OT. Pt with similar presentation to previous session requiring +2 total A for all mobility. No change in DC/DME recs at this time. PT will continue to follow.    If plan is discharge home, recommend the following: Two people to help with walking and/or transfers;Two people to help with bathing/dressing/bathroom;Assistance with cooking/housework;Assist for transportation;Help with stairs or ramp for entrance;Supervision due to cognitive status   Can travel by private vehicle     No  Equipment Recommendations  Other (comment)    Recommendations for Other Services       Precautions / Restrictions Precautions Precautions: Fall Precaution Comments: watch BP Restrictions Weight Bearing Restrictions: Yes RLE Weight Bearing: Weight bearing as tolerated     Mobility  Bed Mobility Overal bed mobility: Needs Assistance Bed Mobility: Supine to Sit, Sit to Supine     Supine to sit: Total assist, +2 for physical assistance Sit to supine: Total assist, +2 for physical assistance   General bed mobility comments: Pt needed total assist +2 to asssist to EOB with incr pain right LE with movement.    Transfers                   General transfer comment: Attempted to stand pt with +2 total A however pt only able to clear hips a few inches before requesting to sit down.    Ambulation/Gait               General Gait Details: Unable   Stairs             Wheelchair Mobility     Tilt Bed    Modified Rankin (Stroke Patients Only)        Balance Overall balance assessment: Needs assistance Sitting-balance support: No upper extremity supported, Feet supported, Bilateral upper extremity supported, Single extremity supported Sitting balance-Leahy Scale: Poor Sitting balance - Comments: posterior lean and relies on at least min assist external support Postural control: Posterior lean                                  Cognition Arousal: Alert Behavior During Therapy: Flat affect Overall Cognitive Status: History of cognitive impairments - at baseline                                 General Comments: hx of dementia, pt talks very softly and tended to ramble, Oriented to self only        Exercises      General Comments General comments (skin integrity, edema, etc.): BP soft but stable      Pertinent Vitals/Pain Pain Assessment Pain Assessment: Faces Faces Pain Scale: Hurts whole lot Pain Location: right hip Pain Descriptors / Indicators: Aching, Discomfort, Grimacing, Guarding Pain Intervention(s): Limited activity within patient's tolerance, Monitored during session, Repositioned    Home Living                          Prior Function  PT Goals (current goals can now be found in the care plan section) Progress towards PT goals: Progressing toward goals    Frequency    Min 1X/week      PT Plan      Co-evaluation PT/OT/SLP Co-Evaluation/Treatment: Yes Reason for Co-Treatment: For patient/therapist safety;To address functional/ADL transfers;Necessary to address cognition/behavior during functional activity PT goals addressed during session: Mobility/safety with mobility OT goals addressed during session: ADL's and self-care      AM-PAC PT "6 Clicks" Mobility   Outcome Measure  Help needed turning from your back to your side while in a flat bed without using bedrails?: Total Help needed moving from lying on your back to sitting on the side of a flat  bed without using bedrails?: Total Help needed moving to and from a bed to a chair (including a wheelchair)?: Total Help needed standing up from a chair using your arms (e.g., wheelchair or bedside chair)?: Total Help needed to walk in hospital room?: Total Help needed climbing 3-5 steps with a railing? : Total 6 Click Score: 6    End of Session Equipment Utilized During Treatment: Gait belt Activity Tolerance: Patient limited by pain Patient left: in bed;with call bell/phone within reach;with bed alarm set Nurse Communication: Mobility status;Need for lift equipment PT Visit Diagnosis: Muscle weakness (generalized) (M62.81);Unsteadiness on feet (R26.81);History of falling (Z91.81)     Time: 1610-9604 PT Time Calculation (min) (ACUTE ONLY): 27 min  Charges:    $Gait Training: 8-22 mins PT General Charges $$ ACUTE PT VISIT: 1 Visit                     Shela Nevin, PT, DPT Acute Rehab Services [phone]    Gladys Damme 12/27/2022, 12:11 PM

## 2022-12-27 NOTE — Progress Notes (Signed)
Progress Note   Patient: Amy [name] AOZ:[id number] DOB: Jun 08, 1945 DOA: 12/23/2022     4 DOS: the patient was seen and examined on 12/27/2022    Subjective:  Seen and examined at bedside this morning Still exhibiting symptoms of dementia No evidence of external bleeding noted Denies nausea vomiting chest pain   Brief hospital course: From HPI "Amy [name] is a 77 y.o. female with medical history significant of advanced Parkinson's disease, dementia, Hashimoto's thyroiditis, hypothyroidism, osteoporosis, RLS, PVCs presented to ED via EMS after an unwitnessed fall at home.  Family reported that patient was found on the ground.  She was hemodynamically stable with EMS.  Complained of right hip pain.  Labs notable for WBC 19.2, hemoglobin 11.8 (previously >14 on labs done over 3 years ago), MCV 88.8.  X-ray of hip/pelvis showing acute intertrochanteric fracture of the proximal right femur.  Chest x-ray showing no acute cardiopulmonary disease.  CT head/C-spine negative for acute findings.  EDP discussed the case with Dr. Hulda Humphrey, orthopedics will consult.  TRH called to admit.   Patient has advanced dementia, nonverbal.  History provided by her husband and son at bedside.  Husband states he stays with the patient 24 hours a day due to her advanced Parkinson's/dementia.  Today he stepped away from her for a few minutes when he heard her fall.  When he arrived, she was on the ground screaming in pain.  Other than the fall today, she has been at baseline.  No fevers, cough, shortness of breath, vomiting, diarrhea, or abdominal pain reported by family."     Assessment and Plan:   Acute intertrochanteric fracture of the proximal right femur Patient is status post right hip intramedullary nailing done by orthopedics on 12/24/2022 Continue as needed pain management Continue PT OT Orthopedics on board we appreciate input TOC manager working on placement Continue to monitor pain scale     ACute on  chronic anemia likely postoperative patient with baseline hemoglobin of around 11 Postoperatively patient's hemoglobin has down trended to 7s with associated generalized weakness. We will transfuse 1 unit of packed RBC today We will continue to monitor CBC    Urinary tract infection Has completed a course of antibiotics   Dehydration-improved She has dry mucous membranes and BUN slightly elevated on labs.   Encourage oral hydration   Advanced Parkinson's disease/dementia Continue amantadine and carbidopa-levodopa.   Continue delirium precaution Palliative care on board we appreciate input   Hypothyroidism Continue levothyroxine   Restless leg syndrome Continue pramipexole.   History of PVCs Continue cardiac monitoring   DVT prophylaxis: SCDs   Code Status: DNI DNR     Consults called: Orthopedics   Level of care: Telemetry bed     Physical Exam: ENT:     Head: Normocephalic and atraumatic.     Mouth/Throat:     Mouth: Mucous membranes wet Eyes:     Extraocular Movements: Extraocular movements intact.  Cardiovascular:     Rate and Rhythm: Normal rate and regular rhythm.     Pulses: Normal pulses.  Pulmonary:     Effort: Pulmonary effort is normal. No respiratory distress.     Breath sounds: Normal breath sounds. No wheezing or rales.  Abdominal:     General: Bowel sounds are normal. There is no distension.     Palpations: Abdomen is soft.     Tenderness: There is no abdominal tenderness.  Musculoskeletal:     Cervical back: Normal range of motion.     Right  lower leg: No edema.     Left lower leg: No edema.     Comments: Dressing noted to the hip Skin:    General: Skin is warm and dry.  Neurological:     Mental Status: She is alert. Mental status is at baseline.      Data Reviewed: I have reviewed patient's lab report as shown below, vitals, PT OT documentation as well as transition of care managers documentation and nursing documentation  Family  Communication: Husband and son updated   Disposition: Status is: Inpatient    Planned Discharge Destination: Pending orthopedic clearance as well as PT OT commendation, PT OT recommended SNF     Time spent: 40 minutes     Latest Ref Rng & Units 12/27/2022    8:07 AM 12/26/2022    7:12 AM 12/24/2022    6:12 AM  CBC  WBC 4.0 - 10.5 K/uL 12.6  12.5  15.3   Hemoglobin 12.0 - 15.0 g/dL 7.6  8.1  91.4   Hematocrit 36.0 - 46.0 % 23.8  25.0  34.4   Platelets 150 - 400 K/uL 186  154  232        Latest Ref Rng & Units 12/23/2022    7:50 PM 07/29/2018   11:58 AM 06/04/2018    5:17 PM  BMP  Glucose 70 - 99 mg/dL 782  92  98   BUN 8 - 23 mg/dL 29  14  15    Creatinine 0.44 - 1.00 mg/dL 9.56  2.13  0.86   Sodium 135 - 145 mmol/L 141  138  137   Potassium 3.5 - 5.1 mmol/L 4.6  4.2  5.0   Chloride 98 - 111 mmol/L 106  102  105   CO2 22 - 32 mmol/L 25  21  23    Calcium 8.9 - 10.3 mg/dL 9.4  9.5  8.6     Vitals:   12/26/22 0348 12/26/22 1130 12/27/22 0500 12/27/22 0930  BP: 103/67 95/60  (!) 101/57  Pulse: 97   85  Resp: 18   20  Temp: 98 F (36.7 C)   98.4 F (36.9 C)  TempSrc:   Oral Axillary  SpO2: 99%   96%  Weight:      Height:         Author: Loyce Dys, MD 12/27/2022 12:51 PM  For on call review [email or web address].

## 2022-12-27 NOTE — TOC Progression Note (Addendum)
Transition of Care [facility]) - Progression Note    Patient Details  Name: Amy [name] MRN: [id number] Date of Birth: August 21, 1945  Transition of Care [facility]) CM/SW Contact  Lorri Frederick, LCSW Phone Number: 12/27/2022, 12:08 PM  Clinical Narrative:   Bed offers provided to pt husband and son in room.  They are requesting response from [facility].  CSW reached out to that facility.    1330: [facility] does not have available beds.  CSW called husband, left message.   1430: CSW spoke with husband Amy [name], updated him that no beds available at [facility].  He will look at other options, asked that CSW check back with him tomorrow.   Expected Discharge Plan: Skilled Nursing Facility Barriers to Discharge: Continued Medical Work up, SNF Pending bed offer  Expected Discharge Plan and Services In-house Referral: Clinical Social Work     Living arrangements for the past 2 months: Single Family Home                                       Social Determinants of Health (SDOH) Interventions SDOH Screenings   Tobacco Use: Low Risk  (12/24/2022)    Readmission Risk Interventions     No data to display

## 2022-12-27 NOTE — Progress Notes (Signed)
Daily Progress Note   Patient Name: Amy [name]       Date: 12/27/2022 DOB: 1945-07-02  Age: 77 y.o. MRN#: [id number] Attending Physician: Loyce Dys, MD Primary Care Physician: Georgann Housekeeper, MD Admit Date: 12/23/2022  Reason for Consultation/Follow-up: Establishing goals of care  Subjective: AM: Medical records reviewed including progress notes, MAR, labs, and imaging. Patient assessed at the bedside. She is working with PT/OT. No visitors present  Called patient's husband Amy [name] for ongoing palliative support and GOC discussions. Provided update on overnight difficulty with patient agitation and refusal of medications. He is not surprised, stating that this is her normal. Explored whether family has had the opportunity to review MOST form and he confirms they have. He would like to meet at 12:30pm to discuss further to ensure understanding and finalize MOST form.   PM: Returned to the bedside to meet with patient husband and son Amy [name] as planned. An extensive conversation was had, covering concepts specific to code status, artifical feeding and hydration, continued IV antibiotics and rehospitalization.  They would like for patient to be transferred back to the hospital if necessary for limited medical interventions.  Counseled on the lack of benefit from a feeding tube and family agrees this would not be beneficial to patient's quality of life.  Questions and concerns addressed. PMT will continue to support holistically.   Length of Stay: 4   Physical Exam Vitals and nursing note reviewed.  Constitutional:      General: She is not in acute distress.    Appearance: She is ill-appearing.  Cardiovascular:     Rate and Rhythm: Normal rate.  Pulmonary:     Effort: Pulmonary effort is  normal.  Neurological:     Mental Status: She is alert. She is disoriented.  Psychiatric:        Mood and Affect: Mood normal.        Cognition and Memory: Cognition is impaired.           Vital Signs: BP 95/60 (BP Location: Left Arm)   Pulse 97   Temp 98 F (36.7 C)   Resp 18   Ht 5\' 2"  (1.575 m)   Wt 48.6 kg   SpO2 99%   BMI 19.60 kg/m  SpO2: SpO2: 99 % O2 Device: O2 Device: Room Air O2 Flow Rate: O2 Flow Rate (L/min): 1 L/min      Palliative Assessment/Data: 20-30%   Palliative Care Assessment & Plan   Patient Profile: 77 y.o. female  with past medical history of advanced Parkinson's disease, dementia, Hashimoto's thyroiditis, hypothyroidism, osteoporosis, RLS, PVCs admitted on 12/23/2022 with unwitnessed fall at home.    X-ray of hip/pelvis showing acute intertrochanteric fracture of the proximal right femur. She is admitted for futher management including surgical repair   PMT has been consulted POD 1 to assist with goals of care conversation  Assessment: Goals of care conversation Acute intertrochanteric fracture of the proximal right femur S/P right hip intramedullary nailing  Advanced Parkinson's disease/dementia  Recommendations/Plan: Continue DNR/DNI Continue current care plan MOST form completed today.  Provided patient's family with copies and left original in patient's hard chart.  Will also scan copy into EMR/[facility] Psychosocial and emotional  support provided Goal remains to continue gentle medical interventions, discharge to SNF for rehab.  No feeding tube PMT remains available as needed.  Please secure chat or call team line with urgent needs   Prognosis:  Guarded, high risk for further decline  Discharge Planning: SNF  Care plan was discussed with patient, patient's husband and son, RN   Total time: I spent 50 minutes in the care of the patient today in the above activities and documenting the encounter.         Rajni Holsworth Jeni Salles,  PA-C  Palliative Medicine Team Team phone # [phone]  Thank you for allowing the Palliative Medicine Team to assist in the care of this patient. Please utilize secure chat with additional questions, if there is no response within 30 minutes please call the above phone number.  Palliative Medicine Team providers are available by phone from 7am to 7pm daily and can be reached through the team cell phone.  Should this patient require assistance outside of these hours, please call the patient's attending physician.

## 2022-12-27 NOTE — Plan of Care (Signed)
[facility] Tonsillectomy/Adenoidectomy, Postoperative PEDS care plan entered in error.

## 2022-12-28 DIAGNOSIS — S72001A Fracture of unspecified part of neck of right femur, initial encounter for closed fracture: Secondary | ICD-10-CM | POA: Diagnosis not present

## 2022-12-28 LAB — HEMOGLOBIN AND HEMATOCRIT, BLOOD
HCT: 29.8 % — ABNORMAL LOW (ref 36.0–46.0)
Hemoglobin: 9.9 g/dL — ABNORMAL LOW (ref 12.0–15.0)

## 2022-12-28 LAB — CBC
HCT: 27.4 % — ABNORMAL LOW (ref 36.0–46.0)
Hemoglobin: 9.1 g/dL — ABNORMAL LOW (ref 12.0–15.0)
MCH: 28.6 pg (ref 26.0–34.0)
MCHC: 33.2 g/dL (ref 30.0–36.0)
MCV: 86.2 fL (ref 80.0–100.0)
Platelets: 221 10*3/uL (ref 150–400)
RBC: 3.18 MIL/uL — ABNORMAL LOW (ref 3.87–5.11)
RDW: 14.3 % (ref 11.5–15.5)
WBC: 14.3 10*3/uL — ABNORMAL HIGH (ref 4.0–10.5)
nRBC: 0 % (ref 0.0–0.2)

## 2022-12-28 NOTE — Plan of Care (Signed)

## 2022-12-28 NOTE — Progress Notes (Addendum)
Progress Note   Patient: Amy [name] AOZ:[id number] DOB: [birth year] DOA: 12/23/2022     5 DOS: the patient was seen and examined on 12/28/2022    Subjective:  -Patient seen. -Patient could not put 1 leg given the significant history.     Brief hospital course: Patient is a 77 year old female with past medical history significant for advanced Parkinson's disease, dementia, Hashimoto's thyroiditis, hypothyroidism, osteoporosis, RLS and PVCs.  Patient was admitted with acute intertrochanteric fracture of the proximal right femoral.  Patient is s/p right hip intramedullary nailing.  Patient is awaiting disposition.     Assessment and Plan:   Acute intertrochanteric fracture of the proximal right femur Patient is status post right hip intramedullary nailing done by orthopedics on 12/24/2022 Continue as needed pain management Continue PT OT Orthopedics on board we appreciate input TOC manager working on placement Continue to optimize pain control.   Pursue disposition.    ACute on chronic anemia likely postoperative patient with baseline hemoglobin of around 11 Postoperatively patient's hemoglobin has down trended to 7s with associated generalized weakness. We will transfuse 1 unit of packed RBC today Hemoglobin is 9.1 g/dL today.  Continue to monitor.   Urinary tract infection Has completed a course of antibiotics   Dehydration-improved She has dry mucous membranes and BUN slightly elevated on labs.   Encourage oral hydration   Advanced Parkinson's disease/dementia Continue amantadine and carbidopa-levodopa.   Continue delirium precaution Palliative care on board we appreciate input   Hypothyroidism Continue levothyroxine   Restless leg syndrome Continue pramipexole.   History of PVCs Continue cardiac monitoring   DVT prophylaxis: SCDs   Code Status: DNI DNR     Consults called: Orthopedics   Level of care: Telemetry bed     Physical Exam: General  condition: Awake and alert.  Not in any distress. HEENT: Patient is pale. Neck: Supple. Lungs: Clear to auscultation. CVs: S1-S2. Abdomen: Soft and nontender. Neuro: Demented.  Moves all extremities. Extremities: No leg edema.      Data Reviewed: I have reviewed patient's lab report as shown below, vitals, PT OT documentation as well as transition of care managers documentation and nursing documentation  Family Communication: Husband and son updated   Disposition: Status is: Inpatient    Planned Discharge Destination: Pending orthopedic clearance as well as PT OT commendation, PT OT recommended SNF     Time spent: 40 minutes     Latest Ref Rng & Units 12/28/2022    8:36 AM 12/28/2022    4:38 AM 12/27/2022    8:07 AM  CBC  WBC 4.0 - 10.5 K/uL  14.3  12.6   Hemoglobin 12.0 - 15.0 g/dL 9.9  9.1  7.6   Hematocrit 36.0 - 46.0 % 29.8  27.4  23.8   Platelets 150 - 400 K/uL  221  186        Latest Ref Rng & Units 12/23/2022    7:50 PM 07/29/2018   11:58 AM 06/04/2018    5:17 PM  BMP  Glucose 70 - 99 mg/dL 962  92  98   BUN 8 - 23 mg/dL 29  14  15    Creatinine 0.44 - 1.00 mg/dL 9.52  8.41  3.24   Sodium 135 - 145 mmol/L 141  138  137   Potassium 3.5 - 5.1 mmol/L 4.6  4.2  5.0   Chloride 98 - 111 mmol/L 106  102  105   CO2 22 - 32 mmol/L 25  21  23  Calcium 8.9 - 10.3 mg/dL 9.4  9.5  8.6     Vitals:   12/28/22 0242 12/28/22 0305 12/28/22 0533 12/28/22 0720  BP: (!) 91/58 (!) 90/57 (!) 106/59 100/60  Pulse: 84   78  Resp: 19 16 18 18   Temp: 98 F (36.7 C) (!) 97.5 F (36.4 C) 97.8 F (36.6 C) 97.7 F (36.5 C)  TempSrc: Oral Oral Oral Axillary  SpO2: 98%   100%  Weight:      Height:         Author: Barnetta Chapel, MD 12/28/2022 3:14 PM  For on call review [email or web address].

## 2022-12-29 DIAGNOSIS — S72001A Fracture of unspecified part of neck of right femur, initial encounter for closed fracture: Secondary | ICD-10-CM | POA: Diagnosis not present

## 2022-12-29 LAB — RENAL FUNCTION PANEL
Albumin: 3.1 g/dL — ABNORMAL LOW (ref 3.5–5.0)
Anion gap: 10 (ref 5–15)
BUN: 35 mg/dL — ABNORMAL HIGH (ref 8–23)
CO2: 26 mmol/L (ref 22–32)
Calcium: 9.2 mg/dL (ref 8.9–10.3)
Chloride: 106 mmol/L (ref 98–111)
Creatinine, Ser: 0.69 mg/dL (ref 0.44–1.00)
GFR, Estimated: >60 mL/min (ref >60)
Glucose, Bld: 115 mg/dL — ABNORMAL HIGH (ref 70–99)
Phosphorus: 4.2 mg/dL (ref 2.5–4.6)
Potassium: 4.2 mmol/L (ref 3.5–5.1)
Sodium: 142 mmol/L (ref 135–145)

## 2022-12-29 LAB — TYPE AND SCREEN
ABO/RH(D): O POS
Antibody Screen: NEGATIVE
Unit division: 0

## 2022-12-29 LAB — CBC
HCT: 32.4 % — ABNORMAL LOW (ref 36.0–46.0)
Hemoglobin: 10.2 g/dL — ABNORMAL LOW (ref 12.0–15.0)
MCH: 28 pg (ref 26.0–34.0)
MCHC: 31.5 g/dL (ref 30.0–36.0)
MCV: 89 fL (ref 80.0–100.0)
Platelets: 324 10*3/uL (ref 150–400)
RBC: 3.64 MIL/uL — ABNORMAL LOW (ref 3.87–5.11)
RDW: 14.5 % (ref 11.5–15.5)
WBC: 12.3 10*3/uL — ABNORMAL HIGH (ref 4.0–10.5)
nRBC: 0 % (ref 0.0–0.2)

## 2022-12-29 LAB — BPAM RBC
Blood Product Expiration Date: 202411132359
ISSUE DATE / TIME: 202410190242
Unit Type and Rh: 5100

## 2022-12-29 LAB — MAGNESIUM: Magnesium: 2.3 mg/dL (ref 1.7–2.4)

## 2022-12-29 NOTE — Progress Notes (Signed)
Progress Note   Patient: Amy [name] [id number]:[id number] DOB: 1945-04-01 DOA: 12/23/2022     6 DOS: the patient was seen and examined on 12/29/2022    Subjective:  -Patient seen alongside patient's husband and son. -No new changes. -No significant history from patient. -Awaiting disposition.      Brief hospital course: Patient is a 77 year old female with past medical history significant for advanced Parkinson's disease, dementia, Hashimoto's thyroiditis, hypothyroidism, osteoporosis, RLS and PVCs.  Patient was admitted with acute intertrochanteric fracture of the proximal right femoral.  Patient is s/p right hip intramedullary nailing.  Patient is awaiting disposition.     Assessment and Plan:   Acute intertrochanteric fracture of the proximal right femur Patient is status post right hip intramedullary nailing done by orthopedics on 12/24/2022 Continue as needed pain management Continue PT OT Orthopedics on board we appreciate input TOC manager working on placement Continue to optimize pain control.   Pursue disposition.    ACute on chronic anemia likely postoperative patient with baseline hemoglobin of around 11 Postoperatively patient's hemoglobin has down trended to 7s with associated generalized weakness. We will transfuse 1 unit of packed RBC today Hemoglobin is 9.1 g/dL today.  Continue to monitor.   Urinary tract infection Has completed a course of antibiotics   Dehydration-improved She has dry mucous membranes and BUN slightly elevated on labs.   Encourage oral hydration   Advanced Parkinson's disease/dementia Continue amantadine and carbidopa-levodopa.   Continue delirium precaution Palliative care on board we appreciate input   Hypothyroidism Continue levothyroxine   Restless leg syndrome Continue pramipexole.   History of PVCs Continue cardiac monitoring   DVT prophylaxis: SCDs   Code Status: DNI DNR     Consults called: Orthopedics   Level of  care: Telemetry bed     Physical Exam: General condition: Awake and alert.  Not in any distress. HEENT: Patient is pale. Neck: Supple. Lungs: Clear to auscultation. CVs: S1-S2. Abdomen: Soft and nontender. Neuro: Demented.  Moves all extremities. Extremities: No leg edema.      Data Reviewed: I have reviewed patient's lab report as shown below, vitals, PT OT documentation as well as transition of care managers documentation and nursing documentation  Family Communication: Husband and son updated   Disposition: Status is: Inpatient    Planned Discharge Destination: Pending orthopedic clearance as well as PT OT commendation, PT OT recommended SNF     Time spent: 40 minutes     Latest Ref Rng & Units 12/28/2022    8:36 AM 12/28/2022    4:38 AM 12/27/2022    8:07 AM  CBC  WBC 4.0 - 10.5 K/uL  14.3  12.6   Hemoglobin 12.0 - 15.0 g/dL 9.9  9.1  7.6   Hematocrit 36.0 - 46.0 % 29.8  27.4  23.8   Platelets 150 - 400 K/uL  221  186        Latest Ref Rng & Units 12/23/2022    7:50 PM 07/29/2018   11:58 AM 06/04/2018    5:17 PM  BMP  Glucose 70 - 99 mg/dL 366  92  98   BUN 8 - 23 mg/dL 29  14  15    Creatinine 0.44 - 1.00 mg/dL 4.40  3.47  4.25   Sodium 135 - 145 mmol/L 141  138  137   Potassium 3.5 - 5.1 mmol/L 4.6  4.2  5.0   Chloride 98 - 111 mmol/L 106  102  105   CO2 22 - 32  mmol/L 25  21  23    Calcium 8.9 - 10.3 mg/dL 9.4  9.5  8.6     Vitals:   12/28/22 1500 12/28/22 1611 12/29/22 0550 12/29/22 0837  BP:  99/77 133/86 117/64  Pulse: 89 88 85 85  Resp:   18 18  Temp: 98.1 F (36.7 C)  98 F (36.7 C) 98.6 F (37 C)  TempSrc:    Oral  SpO2: 98% 96% 97% 97%  Weight:      Height:         Author: Barnetta Chapel, MD 12/29/2022 4:08 PM  For on call review [email or web address].

## 2022-12-29 NOTE — Plan of Care (Signed)

## 2022-12-30 DIAGNOSIS — I959 Hypotension, unspecified: Secondary | ICD-10-CM | POA: Diagnosis not present

## 2022-12-30 DIAGNOSIS — A419 Sepsis, unspecified organism: Secondary | ICD-10-CM | POA: Diagnosis present

## 2022-12-30 DIAGNOSIS — E871 Hypo-osmolality and hyponatremia: Secondary | ICD-10-CM | POA: Diagnosis present

## 2022-12-30 DIAGNOSIS — E440 Moderate protein-calorie malnutrition: Secondary | ICD-10-CM | POA: Diagnosis not present

## 2022-12-30 DIAGNOSIS — E43 Unspecified severe protein-calorie malnutrition: Secondary | ICD-10-CM | POA: Diagnosis present

## 2022-12-30 DIAGNOSIS — K5730 Diverticulosis of large intestine without perforation or abscess without bleeding: Secondary | ICD-10-CM | POA: Diagnosis not present

## 2022-12-30 DIAGNOSIS — E039 Hypothyroidism, unspecified: Secondary | ICD-10-CM | POA: Diagnosis not present

## 2022-12-30 DIAGNOSIS — E876 Hypokalemia: Secondary | ICD-10-CM | POA: Diagnosis present

## 2022-12-30 DIAGNOSIS — R509 Fever, unspecified: Secondary | ICD-10-CM | POA: Diagnosis not present

## 2022-12-30 DIAGNOSIS — Z681 Body mass index (BMI) 19 or less, adult: Secondary | ICD-10-CM | POA: Diagnosis not present

## 2022-12-30 DIAGNOSIS — R630 Anorexia: Secondary | ICD-10-CM | POA: Diagnosis not present

## 2022-12-30 DIAGNOSIS — S72001D Fracture of unspecified part of neck of right femur, subsequent encounter for closed fracture with routine healing: Secondary | ICD-10-CM | POA: Diagnosis not present

## 2022-12-30 DIAGNOSIS — Z7189 Other specified counseling: Secondary | ICD-10-CM | POA: Diagnosis not present

## 2022-12-30 DIAGNOSIS — R6521 Severe sepsis with septic shock: Secondary | ICD-10-CM | POA: Diagnosis present

## 2022-12-30 DIAGNOSIS — G20C Parkinsonism, unspecified: Secondary | ICD-10-CM | POA: Diagnosis not present

## 2022-12-30 DIAGNOSIS — R2681 Unsteadiness on feet: Secondary | ICD-10-CM | POA: Diagnosis not present

## 2022-12-30 DIAGNOSIS — E870 Hyperosmolality and hypernatremia: Secondary | ICD-10-CM | POA: Diagnosis present

## 2022-12-30 DIAGNOSIS — E063 Autoimmune thyroiditis: Secondary | ICD-10-CM | POA: Diagnosis present

## 2022-12-30 DIAGNOSIS — Z79890 Hormone replacement therapy (postmenopausal): Secondary | ICD-10-CM | POA: Diagnosis not present

## 2022-12-30 DIAGNOSIS — S72142D Displaced intertrochanteric fracture of left femur, subsequent encounter for closed fracture with routine healing: Secondary | ICD-10-CM | POA: Diagnosis not present

## 2022-12-30 DIAGNOSIS — R1310 Dysphagia, unspecified: Secondary | ICD-10-CM | POA: Diagnosis present

## 2022-12-30 DIAGNOSIS — R1312 Dysphagia, oropharyngeal phase: Secondary | ICD-10-CM | POA: Diagnosis not present

## 2022-12-30 DIAGNOSIS — G20A1 Parkinson's disease without dyskinesia, without mention of fluctuations: Secondary | ICD-10-CM | POA: Diagnosis present

## 2022-12-30 DIAGNOSIS — R6520 Severe sepsis without septic shock: Secondary | ICD-10-CM | POA: Diagnosis not present

## 2022-12-30 DIAGNOSIS — F0390 Unspecified dementia without behavioral disturbance: Secondary | ICD-10-CM | POA: Diagnosis not present

## 2022-12-30 DIAGNOSIS — Z4789 Encounter for other orthopedic aftercare: Secondary | ICD-10-CM | POA: Diagnosis not present

## 2022-12-30 DIAGNOSIS — E860 Dehydration: Secondary | ICD-10-CM | POA: Diagnosis present

## 2022-12-30 DIAGNOSIS — R41841 Cognitive communication deficit: Secondary | ICD-10-CM | POA: Diagnosis not present

## 2022-12-30 DIAGNOSIS — Z9181 History of falling: Secondary | ICD-10-CM | POA: Diagnosis not present

## 2022-12-30 DIAGNOSIS — R627 Adult failure to thrive: Secondary | ICD-10-CM | POA: Diagnosis not present

## 2022-12-30 DIAGNOSIS — R531 Weakness: Secondary | ICD-10-CM | POA: Diagnosis not present

## 2022-12-30 DIAGNOSIS — R2689 Other abnormalities of gait and mobility: Secondary | ICD-10-CM | POA: Diagnosis not present

## 2022-12-30 DIAGNOSIS — G2581 Restless legs syndrome: Secondary | ICD-10-CM | POA: Diagnosis present

## 2022-12-30 DIAGNOSIS — N390 Urinary tract infection, site not specified: Secondary | ICD-10-CM | POA: Diagnosis not present

## 2022-12-30 DIAGNOSIS — F0280 Dementia in other diseases classified elsewhere without behavioral disturbance: Secondary | ICD-10-CM | POA: Diagnosis present

## 2022-12-30 DIAGNOSIS — N179 Acute kidney failure, unspecified: Secondary | ICD-10-CM | POA: Diagnosis present

## 2022-12-30 DIAGNOSIS — G9341 Metabolic encephalopathy: Secondary | ICD-10-CM | POA: Diagnosis present

## 2022-12-30 DIAGNOSIS — B351 Tinea unguium: Secondary | ICD-10-CM | POA: Diagnosis present

## 2022-12-30 DIAGNOSIS — M6259 Muscle wasting and atrophy, not elsewhere classified, multiple sites: Secondary | ICD-10-CM | POA: Diagnosis not present

## 2022-12-30 DIAGNOSIS — M6281 Muscle weakness (generalized): Secondary | ICD-10-CM | POA: Diagnosis not present

## 2022-12-30 DIAGNOSIS — R0689 Other abnormalities of breathing: Secondary | ICD-10-CM | POA: Diagnosis not present

## 2022-12-30 DIAGNOSIS — Z741 Need for assistance with personal care: Secondary | ICD-10-CM | POA: Diagnosis not present

## 2022-12-30 DIAGNOSIS — Z515 Encounter for palliative care: Secondary | ICD-10-CM | POA: Diagnosis not present

## 2022-12-30 DIAGNOSIS — E872 Acidosis, unspecified: Secondary | ICD-10-CM | POA: Diagnosis present

## 2022-12-30 DIAGNOSIS — L89150 Pressure ulcer of sacral region, unstageable: Secondary | ICD-10-CM | POA: Diagnosis present

## 2022-12-30 DIAGNOSIS — Z66 Do not resuscitate: Secondary | ICD-10-CM | POA: Diagnosis present

## 2022-12-30 DIAGNOSIS — K7689 Other specified diseases of liver: Secondary | ICD-10-CM | POA: Diagnosis not present

## 2022-12-30 DIAGNOSIS — R000 Tachycardia, unspecified: Secondary | ICD-10-CM | POA: Diagnosis not present

## 2022-12-30 DIAGNOSIS — S72001S Fracture of unspecified part of neck of right femur, sequela: Secondary | ICD-10-CM | POA: Diagnosis not present

## 2022-12-30 DIAGNOSIS — Z7401 Bed confinement status: Secondary | ICD-10-CM | POA: Diagnosis not present

## 2022-12-30 LAB — CBC WITH DIFFERENTIAL/PLATELET
Abs Immature Granulocytes: 0.05 10*3/uL (ref 0.00–0.07)
Basophils Absolute: 0 10*3/uL (ref 0.0–0.1)
Basophils Relative: 0 %
Eosinophils Absolute: 0.1 10*3/uL (ref 0.0–0.5)
Eosinophils Relative: 1 %
HCT: 31.3 % — ABNORMAL LOW (ref 36.0–46.0)
Hemoglobin: 10.2 g/dL — ABNORMAL LOW (ref 12.0–15.0)
Immature Granulocytes: 0 %
Lymphocytes Relative: 10 %
Lymphs Abs: 1.2 10*3/uL (ref 0.7–4.0)
MCH: 28.7 pg (ref 26.0–34.0)
MCHC: 32.6 g/dL (ref 30.0–36.0)
MCV: 87.9 fL (ref 80.0–100.0)
Monocytes Absolute: 1.1 10*3/uL — ABNORMAL HIGH (ref 0.1–1.0)
Monocytes Relative: 9 %
Neutro Abs: 9.4 10*3/uL — ABNORMAL HIGH (ref 1.7–7.7)
Neutrophils Relative %: 80 %
Platelets: 338 10*3/uL (ref 150–400)
RBC: 3.56 MIL/uL — ABNORMAL LOW (ref 3.87–5.11)
RDW: 14.6 % (ref 11.5–15.5)
WBC: 11.9 10*3/uL — ABNORMAL HIGH (ref 4.0–10.5)
nRBC: 0 % (ref 0.0–0.2)

## 2022-12-30 MED ORDER — ASPIRIN 81 MG PO TBEC: 81.0000 mg | DELAYED_RELEASE_TABLET | Freq: Two times a day (BID) | ORAL | 0 refills | Status: DC

## 2022-12-30 MED ORDER — TRAZODONE HCL 50 MG PO TABS
50.0000 mg | ORAL_TABLET | Freq: Every evening | ORAL | Status: DC | PRN
  Administered 2022-12-30: 50 mg via ORAL
  Filled 2022-12-30: qty 1

## 2022-12-30 MED ORDER — OXYCODONE HCL 5 MG PO TABS: 5.0000 mg | ORAL_TABLET | Freq: Four times a day (QID) | ORAL | 0 refills | Status: DC | PRN

## 2022-12-30 NOTE — Plan of Care (Signed)

## 2022-12-30 NOTE — Discharge Summary (Signed)
Amy [name] DGL:[id number] DOB: 14-Jun-1945 DOA: 12/23/2022  PCP: Georgann Housekeeper, MD  Admit date: 12/23/2022 Discharge date: 12/30/2022  Time spent: 35 minutes  Recommendations for Outpatient Follow-up:  Pcp and orthopedics f/u     Discharge Diagnoses:  Principal Problem:   Hip fracture [facility]) Active Problems:   Parkinson's disease (HCC)   Restless leg syndrome   Hypothyroidism   Leukocytosis   Dehydration   Malnutrition of moderate degree   Discharge Condition: stable  Diet recommendation: heart healthy  Filed Weights   12/23/22 2051 12/24/22 0100  Weight: 47 kg 48.6 kg    History of present illness:  From admission h and p Amy [name] is a 77 y.o. female with medical history significant of advanced Parkinson's disease, dementia, Hashimoto's thyroiditis, hypothyroidism, osteoporosis, RLS, PVCs presented to ED via EMS after an unwitnessed fall at home.  Family reported that patient was found on the ground.  She was hemodynamically stable with EMS.  Complained of right hip pain.  Labs notable for WBC 19.2, hemoglobin 11.8 (previously >14 on labs done over 3 years ago), MCV 88.8.  X-ray of hip/pelvis showing acute intertrochanteric fracture of the proximal right femur.  Chest x-ray showing no acute cardiopulmonary disease.  CT head/C-spine negative for acute findings.  EDP discussed the case with Dr. Hulda Humphrey, orthopedics will consult.  [facility] called to admit.   Patient has advanced dementia, nonverbal.  History provided by her husband and son at bedside.  Husband states he stays with the patient 24 hours a day due to her advanced Parkinson's/dementia.  Today he stepped away from her for a few minutes when he heard her fall.  When he arrived, she was on the ground screaming in pain.  Other than the fall today, she has been at baseline.  No fevers, cough, shortness of breath, vomiting, diarrhea, or abdominal pain reported by family.  Hospital Course:  Acute intertrochanteric fracture of  the proximal right femur Patient is status post right hip intramedullary nailing done by orthopedics on 12/24/2022 Continue as needed pain management Ortho f/u in 1-2 weeks, need to schedule Aspirin bid for dvt ppx D/c plan reviewed with husband Appears patient was previously treated for osteoporosis, consider pcp f/u to re-engage with that therapy   ACute on chronic anemia likely postoperative patient with baseline hemoglobin of around 11 Postoperatively patient's hemoglobin has down trended to 7s with associated generalized weakness. Transfused one unit and hgb now stable 9-10s   Urinary tract infection Has completed a course of antibiotics   Advanced Parkinson's disease/dementia Continue amantadine and carbidopa-levodopa.   Continue delirium precaution   Hypothyroidism Continue levothyroxine   Restless leg syndrome Continue pramipexole.   History of PVCs Continue cardiac monitoring  Procedures: Right hip cephalomedullary nailing 12/24/22  Consultations: Orthopedics, palliative  Discharge Exam: Vitals:   12/30/22 0400 12/30/22 0804  BP: (!) 98/56 114/68  Pulse: 86 98  Resp:  17  Temp: 98.4 F (36.9 C) 97.9 F (36.6 C)  SpO2: 95% 95%    General condition: Awake and alert.  Not in any distress. Lungs: Clear to auscultation. CVs: S1-S2. Abdomen: Soft and nontender. Neuro: moving all 4 Extremities: bruising around right hip, bandage c/d/i  Discharge Instructions   Discharge Instructions     Diet - low sodium heart healthy   Complete by: As directed    Increase activity slowly   Complete by: As directed       Allergies as of 12/30/2022       Reactions  Ciprofloxacin Rash        Medication List     STOP taking these medications    alendronate 70 MG tablet Commonly known as: FOSAMAX   Nuplazid 10 MG Tabs Generic drug: Pimavanserin Tartrate       TAKE these medications    amantadine 100 MG capsule Commonly known as: SYMMETREL TAKE  1 CAPSULE BY MOUTH EVERY MORNING, AT NOON & AT BEDTIME   aspirin EC 81 MG tablet Take 1 tablet (81 mg total) by mouth in the morning and at bedtime. Take after 12 weeks for prevention of preeclampsia later in pregnancy   carbidopa-levodopa 25-100 MG tablet Commonly known as: SINEMET IR TAKE 1 TABLET BY MOUTH 8 TIMES DAILY-6 AM, 8 AM, 10 AM, 12 PM, 2 PM, 4 PM, 6 PM AND 8 PM What changed:  how much to take how to take this when to take this additional instructions   escitalopram 20 MG tablet Commonly known as: Lexapro Take 1 tablet (20 mg total) by mouth daily.   oxyCODONE 5 MG immediate release tablet Commonly known as: Oxy IR/ROXICODONE Take 1 tablet (5 mg total) by mouth every 6 (six) hours as needed for breakthrough pain ((for MODERATE breakthrough pain)).   pramipexole 1 MG tablet Commonly known as: MIRAPEX Take 1 tablet (1 mg total) by mouth 3 (three) times daily.   Synthroid 75 MCG tablet Generic drug: levothyroxine Take 75 mcg by mouth daily before breakfast.       Allergies  Allergen Reactions   Ciprofloxacin Rash    Follow-up Information     Georgann Housekeeper, MD Follow up.   Specialty: Internal Medicine Contact information: [address] [place] [address] [phone]         Luci Bank, MD Follow up.   Specialty: Orthopedic Surgery Why: call to schedule follow-up Contact information: [address] [place] [address] [phone]                  The results of significant diagnostics from this hospitalization (including imaging, microbiology, ancillary and laboratory) are listed below for reference.    Significant Diagnostic Studies: DG FEMUR, MIN 2 VIEWS RIGHT  Result Date: 12/24/2022 CLINICAL DATA:  Elective surgery, femoral intramedullary nail. EXAM: RIGHT FEMUR 2 VIEWS COMPARISON:  Preoperative imaging FINDINGS: Four fluoroscopic spot views of the right femur obtained in the operating room. Femoral  intramedullary nail with distal and trans trochanteric screw fixation of proximal femur fracture. Improved fracture alignment from preoperative imaging. Fluoroscopy time 1 minutes 45 seconds. Dose 9.32 mGy. IMPRESSION: Intraoperative fluoroscopy during ORIF of proximal femur fracture. Electronically Signed   By: Narda Rutherford M.D.   On: 12/24/2022 19:37   DG C-Arm 1-60 Min-No Report  Result Date: 12/24/2022 Fluoroscopy was utilized by the requesting physician.  No radiographic interpretation.   CT CERVICAL SPINE WO CONTRAST  Result Date: 12/23/2022 CLINICAL DATA:  Status post trauma. EXAM: CT CERVICAL SPINE WITHOUT CONTRAST TECHNIQUE: Multidetector CT imaging of the cervical spine was performed without intravenous contrast. Multiplanar CT image reconstructions were also generated. RADIATION DOSE REDUCTION: This exam was performed according to the departmental dose-optimization program which includes automated exposure control, adjustment of the mA and/or kV according to patient size and/or use of iterative reconstruction technique. COMPARISON:  None Available. FINDINGS: Alignment: Normal. Skull base and vertebrae: No acute fracture. Degenerative changes are seen involving the tip of the dens and the adjacent portion of the anterior arch of C1. Chronic appearing sclerotic changes are noted  throughout the C5 vertebral body with a small osseous hemangioma suspected within the C6 vertebral body. Soft tissues and spinal canal: No prevertebral fluid or swelling. No visible canal hematoma. Disc levels: Marked severity endplate sclerosis, anterior osteophyte formation and posterior bony spurring are seen at the levels of C5-C6 and C6-C7. There is marked severity narrowing of the anterior atlantoaxial articulation, with marked severity intervertebral disc space narrowing seen at the levels of C5-C6 and C6-C7. Bilateral marked severity multilevel facet joint hypertrophy is noted. Upper chest: There is moderate  severity bilateral apical scarring and/or atelectasis. Other: None. IMPRESSION: 1. Marked severity multilevel degenerative changes, most prominent at the levels of C5-C6 and C6-C7. 2. No acute cervical spine fracture or subluxation. Electronically Signed   By: Aram Candela M.D.   On: 12/23/2022 22:49   CT HEAD WO CONTRAST  Result Date: 12/23/2022 CLINICAL DATA:  Status post trauma. EXAM: CT HEAD WITHOUT CONTRAST TECHNIQUE: Contiguous axial images were obtained from the base of the skull through the vertex without intravenous contrast. RADIATION DOSE REDUCTION: This exam was performed according to the departmental dose-optimization program which includes automated exposure control, adjustment of the mA and/or kV according to patient size and/or use of iterative reconstruction technique. COMPARISON:  None Available. FINDINGS: Brain: There is mild cerebral atrophy with widening of the extra-axial spaces and ventricular dilatation. There are areas of decreased attenuation within the white matter tracts of the supratentorial brain, consistent with microvascular disease changes. Vascular: No hyperdense vessel or unexpected calcification. Skull: Normal. Negative for fracture or focal lesion. Sinuses/Orbits: No acute finding. Other: None. IMPRESSION: 1. Generalized cerebral atrophy with chronic white matter small vessel ischemic changes. 2. No acute intracranial abnormality. Electronically Signed   By: Aram Candela M.D.   On: 12/23/2022 22:45   DG Pelvis Portable  Result Date: 12/23/2022 CLINICAL DATA:  Status post trauma. EXAM: PORTABLE PELVIS 1-2 VIEWS COMPARISON:  None Available. FINDINGS: An acute, comminuted fracture is seen extending through the inter trochanteric region of the proximal right femur. There is no evidence of dislocation. No pelvic bone lesions are seen. IMPRESSION: Acute intertrochanteric fracture of the proximal right femur. Electronically Signed   By: Aram Candela M.D.   On:  12/23/2022 22:43   DG FEMUR PORT, MIN 2 VIEWS RIGHT  Result Date: 12/23/2022 CLINICAL DATA:  Status post trauma. EXAM: RIGHT FEMUR PORTABLE 2 VIEW COMPARISON:  None Available. FINDINGS: An acute, comminuted fracture deformity is seen extending through the inter trochanteric region and proximal shaft of the right femur. There is no evidence of dislocation. Soft tissues are unremarkable. IMPRESSION: Acute fracture of the proximal right femur. Electronically Signed   By: Aram Candela M.D.   On: 12/23/2022 22:42   DG Chest Port 1 View  Result Date: 12/23/2022 CLINICAL DATA:  Status post trauma. EXAM: PORTABLE CHEST 1 VIEW COMPARISON:  Jul 29, 2018 FINDINGS: The heart size and mediastinal contours are within normal limits. The lungs are hyperinflated with mild, diffuse chronic appearing increased lung markings noted. There is no evidence of an acute infiltrate, pleural effusion or pneumothorax. Multilevel degenerative changes seen throughout the thoracic spine. IMPRESSION: Chronic appearing increased lung markings without evidence of acute or active cardiopulmonary disease. Electronically Signed   By: Aram Candela M.D.   On: 12/23/2022 22:42    Microbiology: Recent Results (from the past 240 hour(s))  Surgical pcr screen     Status: None   Collection Time: 12/24/22  7:02 AM   Specimen: Nasal Mucosa; Nasal Swab  Result Value Ref Range Status   MRSA, PCR NEGATIVE NEGATIVE Final   Staphylococcus aureus NEGATIVE NEGATIVE Final    Comment: (NOTE) The Xpert SA Assay (FDA approved for NASAL specimens in patients 46 years of age and older), is one component of a comprehensive surveillance program. It is not intended to diagnose infection nor to guide or monitor treatment. Performed at [facility], [address] [place], [place] [address]      Labs: Basic Metabolic Panel: Recent Labs  Lab 12/23/22 1950 12/29/22 1854  NA 141 142  K 4.6 4.2  CL 106 106  CO2 25 26  GLUCOSE  128* 115*  BUN 29* 35*  CREATININE 0.66 0.69  CALCIUM 9.4 9.2  MG  --  2.3  PHOS  --  4.2   Liver Function Tests: Recent Labs  Lab 12/23/22 1950 12/29/22 1854  AST 18  --   ALT 5  --   ALKPHOS 107  --   BILITOT 0.6  --   PROT 7.0  --   ALBUMIN 3.8 3.1*   No results for input(s): "LIPASE", "AMYLASE" in the last 168 hours. No results for input(s): "AMMONIA" in the last 168 hours. CBC: Recent Labs  Lab 12/23/22 1950 12/24/22 0612 12/26/22 0712 12/27/22 0807 12/28/22 0438 12/28/22 0836 12/29/22 1854 12/30/22 0816  WBC 19.2*   < > 12.5* 12.6* 14.3*  --  12.3* 11.9*  NEUTROABS 16.4*  --   --   --   --   --   --  9.4*  HGB 11.8*   < > 8.1* 7.6* 9.1* 9.9* 10.2* 10.2*  HCT 37.1   < > 25.0* 23.8* 27.4* 29.8* 32.4* 31.3*  MCV 88.8   < > 85.9 86.5 86.2  --  89.0 87.9  PLT 271   < > 154 186 221  --  324 338   < > = values in this interval not displayed.   Cardiac Enzymes: No results for input(s): "CKTOTAL", "CKMB", "CKMBINDEX", "TROPONINI" in the last 168 hours. BNP: BNP (last 3 results) No results for input(s): "BNP" in the last 8760 hours.  ProBNP (last 3 results) No results for input(s): "PROBNP" in the last 8760 hours.  CBG: No results for input(s): "GLUCAP" in the last 168 hours.     Signed:  Silvano Bilis MD.  [facility] 12/30/2022, 11:10 AM

## 2022-12-30 NOTE — TOC Progression Note (Signed)
Transition of Care [facility]) - Progression Note    Patient Details  Name: Amy [name] MRN: [id number] Date of Birth: 1945/11/29  Transition of Care [facility]) CM/SW Contact  Lorri Frederick, LCSW Phone Number: 12/30/2022, 10:24 AM  Clinical Narrative:   CSW spoke with pt husband Richard regarding SNF choice.  He would like to accept offer at [facility].  CSW confirmed with [facility] that they can receive pt today.   MD informed.    Expected Discharge Plan: Skilled Nursing Facility Barriers to Discharge: Continued Medical Work up, SNF Pending bed offer  Expected Discharge Plan and Services In-house Referral: Clinical Social Work     Living arrangements for the past 2 months: Single Family Home                                       Social Determinants of Health (SDOH) Interventions SDOH Screenings   Tobacco Use: Low Risk  (12/24/2022)    Readmission Risk Interventions     No data to display

## 2022-12-30 NOTE — Progress Notes (Signed)
Report given to Alcario Drought, Health and safety inspector at [facility]. All questions and concerns were fully answered, Pt remains alert to self only, D/C as ordered,  Per SW son is aware of her d/c . AVS/necessary documents provided to [facility]

## 2022-12-30 NOTE — TOC Transition Note (Signed)
Transition of Care [facility]) - CM/SW Discharge Note   Patient Details  Name: Azaleigh Sipe MRN: [id number] Date of Birth: [birth year]  Transition of Care [facility]) CM/SW Contact:  Lorri Frederick, LCSW Phone Number: 12/30/2022, 11:25 AM   Clinical Narrative:   Pt discharging to [facility].  RN call report to [phone].      Final next level of care: Skilled Nursing Facility Barriers to Discharge: Barriers Resolved   Patient Goals and CMS Choice CMS Medicare.gov Compare Post Acute Care list provided to:: Patient Represenative (must comment) (husband) Choice offered to / list presented to : Spouse  Discharge Placement                Patient chooses bed at: [facility] Patient to be transferred to facility by: [facility] Name of family member notified: husband Richard Patient and family notified of of transfer: 12/30/22  Discharge Plan and Services Additional resources added to the After Visit Summary for   In-house Referral: Clinical Social Work                                   Social Determinants of Health (SDOH) Interventions SDOH Screenings   Tobacco Use: Low Risk  (12/24/2022)     Readmission Risk Interventions     No data to display

## 2022-12-30 NOTE — Progress Notes (Signed)
   Medical records reviewed including progress notes, labs, and imaging. Patient remains stable and awaits SNF placement.  Goals of care remain clear for SNF/rehabilitation and gentle medical interventions. [name] have been encouraged to reach out with additional needs or concerns. No palliative needs identified at this time.  PMT will sign off. Please re-consult should needs arise. Thank you for your referral and allowing PMT to assist in Amy [name]'s care.    Richardson Dopp, [facility] Palliative Medicine Team  Team Phone # [phone]   NO CHARGE

## 2023-01-01 DIAGNOSIS — M6259 Muscle wasting and atrophy, not elsewhere classified, multiple sites: Secondary | ICD-10-CM | POA: Diagnosis not present

## 2023-01-01 DIAGNOSIS — F0390 Unspecified dementia without behavioral disturbance: Secondary | ICD-10-CM | POA: Diagnosis not present

## 2023-01-01 DIAGNOSIS — Z4789 Encounter for other orthopedic aftercare: Secondary | ICD-10-CM | POA: Diagnosis not present

## 2023-01-01 DIAGNOSIS — M6281 Muscle weakness (generalized): Secondary | ICD-10-CM | POA: Diagnosis not present

## 2023-01-01 DIAGNOSIS — Z9181 History of falling: Secondary | ICD-10-CM | POA: Diagnosis not present

## 2023-01-01 DIAGNOSIS — G20A1 Parkinson's disease without dyskinesia, without mention of fluctuations: Secondary | ICD-10-CM | POA: Diagnosis not present

## 2023-01-01 DIAGNOSIS — R2689 Other abnormalities of gait and mobility: Secondary | ICD-10-CM | POA: Diagnosis not present

## 2023-01-01 DIAGNOSIS — Z741 Need for assistance with personal care: Secondary | ICD-10-CM | POA: Diagnosis not present

## 2023-01-01 DIAGNOSIS — R2681 Unsteadiness on feet: Secondary | ICD-10-CM | POA: Diagnosis not present

## 2023-01-01 DIAGNOSIS — S72001D Fracture of unspecified part of neck of right femur, subsequent encounter for closed fracture with routine healing: Secondary | ICD-10-CM | POA: Diagnosis not present

## 2023-01-01 DIAGNOSIS — E440 Moderate protein-calorie malnutrition: Secondary | ICD-10-CM | POA: Diagnosis not present

## 2023-01-02 DIAGNOSIS — G20C Parkinsonism, unspecified: Secondary | ICD-10-CM | POA: Diagnosis not present

## 2023-01-02 DIAGNOSIS — G2581 Restless legs syndrome: Secondary | ICD-10-CM | POA: Diagnosis not present

## 2023-01-02 DIAGNOSIS — E039 Hypothyroidism, unspecified: Secondary | ICD-10-CM | POA: Diagnosis not present

## 2023-01-02 DIAGNOSIS — S72001D Fracture of unspecified part of neck of right femur, subsequent encounter for closed fracture with routine healing: Secondary | ICD-10-CM | POA: Diagnosis not present

## 2023-01-03 ENCOUNTER — Telehealth: Payer: Self-pay | Admitting: Neurology

## 2023-01-03 NOTE — Telephone Encounter (Signed)
Pt's husband, Mehlani Moceri cancelled appt due to pt has been hospitalized.

## 2023-01-06 DIAGNOSIS — S72142D Displaced intertrochanteric fracture of left femur, subsequent encounter for closed fracture with routine healing: Secondary | ICD-10-CM | POA: Diagnosis not present

## 2023-01-07 ENCOUNTER — Ambulatory Visit: Payer: Medicare Other | Admitting: Adult Health

## 2023-01-09 DIAGNOSIS — R531 Weakness: Secondary | ICD-10-CM | POA: Diagnosis not present

## 2023-01-09 DIAGNOSIS — R627 Adult failure to thrive: Secondary | ICD-10-CM | POA: Diagnosis not present

## 2023-01-09 DIAGNOSIS — N390 Urinary tract infection, site not specified: Secondary | ICD-10-CM | POA: Diagnosis not present

## 2023-01-09 DIAGNOSIS — R630 Anorexia: Secondary | ICD-10-CM | POA: Diagnosis not present

## 2023-01-12 ENCOUNTER — Emergency Department (HOSPITAL_COMMUNITY): Payer: Medicare Other

## 2023-01-12 ENCOUNTER — Inpatient Hospital Stay (HOSPITAL_COMMUNITY)
Admission: EM | Admit: 2023-01-12 | Discharge: 2023-01-17 | DRG: 871 | Disposition: A | Discharge status: Hospice | Payer: Medicare Other | Source: Skilled Nursing Facility | Attending: Internal Medicine | Admitting: Internal Medicine

## 2023-01-12 DIAGNOSIS — E872 Acidosis, unspecified: Secondary | ICD-10-CM | POA: Diagnosis present

## 2023-01-12 DIAGNOSIS — R509 Fever, unspecified: Secondary | ICD-10-CM | POA: Diagnosis not present

## 2023-01-12 DIAGNOSIS — Z79890 Hormone replacement therapy (postmenopausal): Secondary | ICD-10-CM

## 2023-01-12 DIAGNOSIS — Z803 Family history of malignant neoplasm of breast: Secondary | ICD-10-CM

## 2023-01-12 DIAGNOSIS — E870 Hyperosmolality and hypernatremia: Secondary | ICD-10-CM | POA: Diagnosis present

## 2023-01-12 DIAGNOSIS — Z7982 Long term (current) use of aspirin: Secondary | ICD-10-CM

## 2023-01-12 DIAGNOSIS — G20A2 Parkinson's disease without dyskinesia, with fluctuations: Secondary | ICD-10-CM | POA: Diagnosis not present

## 2023-01-12 DIAGNOSIS — Z681 Body mass index (BMI) 19 or less, adult: Secondary | ICD-10-CM

## 2023-01-12 DIAGNOSIS — R0689 Other abnormalities of breathing: Secondary | ICD-10-CM | POA: Diagnosis not present

## 2023-01-12 DIAGNOSIS — F0280 Dementia in other diseases classified elsewhere without behavioral disturbance: Secondary | ICD-10-CM | POA: Diagnosis present

## 2023-01-12 DIAGNOSIS — R6520 Severe sepsis without septic shock: Secondary | ICD-10-CM | POA: Diagnosis not present

## 2023-01-12 DIAGNOSIS — G2581 Restless legs syndrome: Secondary | ICD-10-CM | POA: Diagnosis present

## 2023-01-12 DIAGNOSIS — L89150 Pressure ulcer of sacral region, unstageable: Secondary | ICD-10-CM | POA: Diagnosis present

## 2023-01-12 DIAGNOSIS — K5730 Diverticulosis of large intestine without perforation or abscess without bleeding: Secondary | ICD-10-CM | POA: Diagnosis not present

## 2023-01-12 DIAGNOSIS — Z515 Encounter for palliative care: Secondary | ICD-10-CM

## 2023-01-12 DIAGNOSIS — E860 Dehydration: Secondary | ICD-10-CM | POA: Diagnosis present

## 2023-01-12 DIAGNOSIS — N390 Urinary tract infection, site not specified: Secondary | ICD-10-CM | POA: Diagnosis present

## 2023-01-12 DIAGNOSIS — G20A1 Parkinson's disease without dyskinesia, without mention of fluctuations: Secondary | ICD-10-CM | POA: Diagnosis present

## 2023-01-12 DIAGNOSIS — Z4682 Encounter for fitting and adjustment of non-vascular catheter: Secondary | ICD-10-CM | POA: Diagnosis not present

## 2023-01-12 DIAGNOSIS — M810 Age-related osteoporosis without current pathological fracture: Secondary | ICD-10-CM | POA: Diagnosis present

## 2023-01-12 DIAGNOSIS — B351 Tinea unguium: Secondary | ICD-10-CM | POA: Diagnosis present

## 2023-01-12 DIAGNOSIS — L89321 Pressure ulcer of left buttock, stage 1: Secondary | ICD-10-CM | POA: Diagnosis present

## 2023-01-12 DIAGNOSIS — R627 Adult failure to thrive: Secondary | ICD-10-CM | POA: Diagnosis present

## 2023-01-12 DIAGNOSIS — N179 Acute kidney failure, unspecified: Secondary | ICD-10-CM | POA: Diagnosis present

## 2023-01-12 DIAGNOSIS — B9620 Unspecified Escherichia coli [E. coli] as the cause of diseases classified elsewhere: Secondary | ICD-10-CM | POA: Diagnosis present

## 2023-01-12 DIAGNOSIS — B964 Proteus (mirabilis) (morganii) as the cause of diseases classified elsewhere: Secondary | ICD-10-CM | POA: Diagnosis present

## 2023-01-12 DIAGNOSIS — E871 Hypo-osmolality and hyponatremia: Secondary | ICD-10-CM | POA: Diagnosis present

## 2023-01-12 DIAGNOSIS — G9341 Metabolic encephalopathy: Secondary | ICD-10-CM | POA: Diagnosis present

## 2023-01-12 DIAGNOSIS — A419 Sepsis, unspecified organism: Principal | ICD-10-CM | POA: Diagnosis present

## 2023-01-12 DIAGNOSIS — R1310 Dysphagia, unspecified: Secondary | ICD-10-CM | POA: Diagnosis present

## 2023-01-12 DIAGNOSIS — Z66 Do not resuscitate: Secondary | ICD-10-CM | POA: Diagnosis present

## 2023-01-12 DIAGNOSIS — R6521 Severe sepsis with septic shock: Secondary | ICD-10-CM | POA: Diagnosis present

## 2023-01-12 DIAGNOSIS — Z7189 Other specified counseling: Secondary | ICD-10-CM | POA: Diagnosis not present

## 2023-01-12 DIAGNOSIS — Z79899 Other long term (current) drug therapy: Secondary | ICD-10-CM

## 2023-01-12 DIAGNOSIS — R000 Tachycardia, unspecified: Secondary | ICD-10-CM | POA: Diagnosis not present

## 2023-01-12 DIAGNOSIS — E063 Autoimmune thyroiditis: Secondary | ICD-10-CM | POA: Diagnosis present

## 2023-01-12 DIAGNOSIS — E876 Hypokalemia: Secondary | ICD-10-CM | POA: Diagnosis present

## 2023-01-12 DIAGNOSIS — L8990 Pressure ulcer of unspecified site, unspecified stage: Secondary | ICD-10-CM | POA: Diagnosis present

## 2023-01-12 DIAGNOSIS — E43 Unspecified severe protein-calorie malnutrition: Secondary | ICD-10-CM | POA: Diagnosis present

## 2023-01-12 DIAGNOSIS — I959 Hypotension, unspecified: Secondary | ICD-10-CM | POA: Diagnosis not present

## 2023-01-12 DIAGNOSIS — R739 Hyperglycemia, unspecified: Secondary | ICD-10-CM | POA: Diagnosis present

## 2023-01-12 DIAGNOSIS — K7689 Other specified diseases of liver: Secondary | ICD-10-CM | POA: Diagnosis not present

## 2023-01-12 LAB — COMPREHENSIVE METABOLIC PANEL
ALT: 21 U/L (ref 0–44)
ALT: 24 U/L (ref 0–44)
AST: 58 U/L — ABNORMAL HIGH (ref 15–41)
AST: 60 U/L — ABNORMAL HIGH (ref 15–41)
Albumin: 2.7 g/dL — ABNORMAL LOW (ref 3.5–5.0)
Albumin: 2.8 g/dL — ABNORMAL LOW (ref 3.5–5.0)
Alkaline Phosphatase: 174 U/L — ABNORMAL HIGH (ref 38–126)
Alkaline Phosphatase: 200 U/L — ABNORMAL HIGH (ref 38–126)
BUN: 147 mg/dL — ABNORMAL HIGH (ref 8–23)
BUN: 140 mg/dL — ABNORMAL HIGH (ref 8–23)
CO2: 13 mmol/L — ABNORMAL LOW (ref 22–32)
CO2: 13 mmol/L — ABNORMAL LOW (ref 22–32)
Calcium: 7.9 mg/dL — ABNORMAL LOW (ref 8.9–10.3)
Calcium: 8 mg/dL — ABNORMAL LOW (ref 8.9–10.3)
Chloride: >130 mmol/L (ref 98–111)
Chloride: >130 mmol/L (ref 98–111)
Creatinine, Ser: 3.39 mg/dL — ABNORMAL HIGH (ref 0.44–1.00)
Creatinine, Ser: 3.32 mg/dL — ABNORMAL HIGH (ref 0.44–1.00)
GFR, Estimated: 14 mL/min — ABNORMAL LOW (ref >60)
GFR, Estimated: 14 mL/min — ABNORMAL LOW (ref >60)
Glucose, Bld: 147 mg/dL — ABNORMAL HIGH (ref 70–99)
Glucose, Bld: 158 mg/dL — ABNORMAL HIGH (ref 70–99)
Potassium: 4.6 mmol/L (ref 3.5–5.1)
Potassium: 4.9 mmol/L (ref 3.5–5.1)
Sodium: 163 mmol/L (ref 135–145)
Sodium: 163 mmol/L (ref 135–145)
Total Bilirubin: 1.6 mg/dL — ABNORMAL HIGH (ref 0.3–1.2)
Total Bilirubin: 1.5 mg/dL — ABNORMAL HIGH (ref 0.3–1.2)
Total Protein: 6.3 g/dL — ABNORMAL LOW (ref 6.5–8.1)
Total Protein: 6.2 g/dL — ABNORMAL LOW (ref 6.5–8.1)

## 2023-01-12 LAB — URINALYSIS, W/ REFLEX TO CULTURE (INFECTION SUSPECTED)
Glucose, UA: NEGATIVE mg/dL
Ketones, ur: NEGATIVE mg/dL
Nitrite: POSITIVE — AB
Protein, ur: >300 mg/dL — AB
Specific Gravity, Urine: 1.02 (ref 1.005–1.030)
WBC, UA: >50 WBC/hpf (ref 0–5)
pH: 8.5 — ABNORMAL HIGH (ref 5.0–8.0)

## 2023-01-12 LAB — CBC WITH DIFFERENTIAL/PLATELET
Abs Immature Granulocytes: 0.12 10*3/uL — ABNORMAL HIGH (ref 0.00–0.07)
Abs Immature Granulocytes: 0.2 10*3/uL — ABNORMAL HIGH (ref 0.00–0.07)
Basophils Absolute: 0 10*3/uL (ref 0.0–0.1)
Basophils Absolute: 0.1 10*3/uL (ref 0.0–0.1)
Basophils Relative: 0 %
Basophils Relative: 0 %
Eosinophils Absolute: 0 10*3/uL (ref 0.0–0.5)
Eosinophils Absolute: 0 10*3/uL (ref 0.0–0.5)
Eosinophils Relative: 0 %
Eosinophils Relative: 0 %
HCT: 38.5 % (ref 36.0–46.0)
HCT: 48 % — ABNORMAL HIGH (ref 36.0–46.0)
Hemoglobin: 10.8 g/dL — ABNORMAL LOW (ref 12.0–15.0)
Hemoglobin: 13.8 g/dL (ref 12.0–15.0)
Immature Granulocytes: 1 %
Immature Granulocytes: 1 %
Lymphocytes Relative: 10 %
Lymphocytes Relative: 9 %
Lymphs Abs: 2 10*3/uL (ref 0.7–4.0)
Lymphs Abs: 2.2 10*3/uL (ref 0.7–4.0)
MCH: 28.6 pg (ref 26.0–34.0)
MCH: 28.2 pg (ref 26.0–34.0)
MCHC: 28.1 g/dL — ABNORMAL LOW (ref 30.0–36.0)
MCHC: 28.8 g/dL — ABNORMAL LOW (ref 30.0–36.0)
MCV: 102.1 fL — ABNORMAL HIGH (ref 80.0–100.0)
MCV: 98 fL (ref 80.0–100.0)
Monocytes Absolute: 0.8 10*3/uL (ref 0.1–1.0)
Monocytes Absolute: 1.1 10*3/uL — ABNORMAL HIGH (ref 0.1–1.0)
Monocytes Relative: 4 %
Monocytes Relative: 4 %
Neutro Abs: 16.6 10*3/uL — ABNORMAL HIGH (ref 1.7–7.7)
Neutro Abs: 21.7 10*3/uL — ABNORMAL HIGH (ref 1.7–7.7)
Neutrophils Relative %: 85 %
Neutrophils Relative %: 86 %
Platelets: 260 10*3/uL (ref 150–400)
Platelets: 315 10*3/uL (ref 150–400)
RBC: 3.77 MIL/uL — ABNORMAL LOW (ref 3.87–5.11)
RBC: 4.9 MIL/uL (ref 3.87–5.11)
RDW: 16.4 % — ABNORMAL HIGH (ref 11.5–15.5)
RDW: 16.3 % — ABNORMAL HIGH (ref 11.5–15.5)
Smear Review: NORMAL
WBC: 19.5 10*3/uL — ABNORMAL HIGH (ref 4.0–10.5)
WBC: 25.3 10*3/uL — ABNORMAL HIGH (ref 4.0–10.5)
nRBC: 0.1 % (ref 0.0–0.2)
nRBC: 0.1 % (ref 0.0–0.2)

## 2023-01-12 LAB — GLUCOSE, CAPILLARY
Glucose-Capillary: 160 mg/dL — ABNORMAL HIGH (ref 70–99)
Glucose-Capillary: 154 mg/dL — ABNORMAL HIGH (ref 70–99)

## 2023-01-12 LAB — BASIC METABOLIC PANEL
Anion gap: 13 (ref 5–15)
BUN: 121 mg/dL — ABNORMAL HIGH (ref 8–23)
CO2: 20 mmol/L — ABNORMAL LOW (ref 22–32)
Calcium: 7.8 mg/dL — ABNORMAL LOW (ref 8.9–10.3)
Chloride: 127 mmol/L — ABNORMAL HIGH (ref 98–111)
Creatinine, Ser: 2.81 mg/dL — ABNORMAL HIGH (ref 0.44–1.00)
GFR, Estimated: 17 mL/min — ABNORMAL LOW (ref >60)
Glucose, Bld: 220 mg/dL — ABNORMAL HIGH (ref 70–99)
Potassium: 4 mmol/L (ref 3.5–5.1)
Sodium: 160 mmol/L — ABNORMAL HIGH (ref 135–145)

## 2023-01-12 LAB — I-STAT CG4 LACTIC ACID, ED: Lactic Acid, Venous: 3.5 mmol/L (ref 0.5–1.9)

## 2023-01-12 LAB — LACTIC ACID, PLASMA: Lactic Acid, Venous: 2.2 mmol/L (ref 0.5–1.9)

## 2023-01-12 LAB — PROTIME-INR
INR: 1.9 — ABNORMAL HIGH (ref 0.8–1.2)
Prothrombin Time: 21.9 s — ABNORMAL HIGH (ref 11.4–15.2)

## 2023-01-12 LAB — MRSA NEXT GEN BY PCR, NASAL: MRSA by PCR Next Gen: NOT DETECTED

## 2023-01-12 MED ORDER — CEFEPIME HCL 2 G IV SOLR
2.0000 g | Freq: Once | INTRAVENOUS | Status: AC
  Administered 2023-01-12: 2 g via INTRAVENOUS
  Filled 2023-01-12: qty 12.5

## 2023-01-12 MED ORDER — LACTATED RINGERS IV BOLUS
1000.0000 mL | Freq: Once | INTRAVENOUS | Status: AC
  Administered 2023-01-12: 1000 mL via INTRAVENOUS

## 2023-01-12 MED ORDER — VANCOMYCIN HCL IN DEXTROSE 1-5 GM/200ML-% IV SOLN
1000.0000 mg | Freq: Once | INTRAVENOUS | Status: AC
Start: 2023-01-12 — End: 2023-01-12
  Administered 2023-01-12: 1000 mg via INTRAVENOUS
  Filled 2023-01-12: qty 200

## 2023-01-12 MED ORDER — METRONIDAZOLE 500 MG/100ML IV SOLN
500.0000 mg | Freq: Once | INTRAVENOUS | Status: AC
  Administered 2023-01-12: 500 mg via INTRAVENOUS
  Filled 2023-01-12: qty 100

## 2023-01-12 MED ORDER — LACTATED RINGERS IV SOLN: INTRAVENOUS | Status: AC

## 2023-01-12 MED ORDER — HEPARIN SODIUM (PORCINE) 5000 UNIT/ML IJ SOLN
5000.0000 [IU] | Freq: Three times a day (TID) | INTRAMUSCULAR | Status: DC
  Administered 2023-01-12 – 2023-01-13 (×2): 5000 [IU] via SUBCUTANEOUS
  Filled 2023-01-12 (×2): qty 1

## 2023-01-12 MED ORDER — DOCUSATE SODIUM 100 MG PO CAPS: 100.0000 mg | ORAL_CAPSULE | Freq: Two times a day (BID) | ORAL | Status: DC | PRN

## 2023-01-12 MED ORDER — SODIUM CHLORIDE 0.9 % IV SOLN
1.0000 g | INTRAVENOUS | Status: DC
  Administered 2023-01-13: 1 g via INTRAVENOUS
  Filled 2023-01-12 (×2): qty 10

## 2023-01-12 MED ORDER — LACTATED RINGERS IV BOLUS
30.0000 mL/kg | Freq: Once | INTRAVENOUS | Status: AC
  Administered 2023-01-12: 1470 mL via INTRAVENOUS

## 2023-01-12 MED ORDER — CHLORHEXIDINE GLUCONATE CLOTH 2 % EX PADS
6.0000 | MEDICATED_PAD | Freq: Every day | CUTANEOUS | Status: DC
  Administered 2023-01-12 – 2023-01-15 (×4): 6 via TOPICAL

## 2023-01-12 MED ORDER — ACETAMINOPHEN 650 MG RE SUPP
650.0000 mg | Freq: Once | RECTAL | Status: AC
  Administered 2023-01-12: 650 mg via RECTAL
  Filled 2023-01-12: qty 1

## 2023-01-12 MED ORDER — POLYETHYLENE GLYCOL 3350 17 G PO PACK: 17.0000 g | PACK | Freq: Every day | ORAL | Status: DC | PRN

## 2023-01-12 MED ORDER — SODIUM CHLORIDE 0.9 % IV SOLN: 250.0000 mL | INTRAVENOUS | Status: DC

## 2023-01-12 MED ORDER — NOREPINEPHRINE 4 MG/250ML-% IV SOLN
1.0000 ug/min | INTRAVENOUS | Status: DC
  Administered 2023-01-12: 2 ug/min via INTRAVENOUS
  Filled 2023-01-12 (×2): qty 250

## 2023-01-12 MED ORDER — METRONIDAZOLE 500 MG/100ML IV SOLN
500.0000 mg | Freq: Two times a day (BID) | INTRAVENOUS | Status: DC
  Administered 2023-01-13 (×2): 500 mg via INTRAVENOUS
  Filled 2023-01-12 (×2): qty 100

## 2023-01-12 MED ORDER — INSULIN ASPART 100 UNIT/ML IJ SOLN
0.0000 [IU] | Freq: Four times a day (QID) | INTRAMUSCULAR | Status: DC
  Administered 2023-01-12 – 2023-01-13 (×2): 2 [IU] via SUBCUTANEOUS

## 2023-01-12 NOTE — Progress Notes (Signed)
eLink Physician-Brief Progress Note Patient Name: Amy [name] DOB: [birth year] MRN: [id number]   Date of Service  01/12/2023  HPI/Events of Note  Received request for lactic acid level as on the sepsis alert Also with Elink sign out to monitor electrolytes. Most recent Na 163 on LR at 150 cc/hr  eICU Interventions  Ordered lactic acid and BMP Elink to be informed once resulted     Intervention Category Intermediate Interventions: Communication with other healthcare providers and/or family;Electrolyte abnormality - evaluation and management  Darl Pikes 01/12/2023, 7:33 PM

## 2023-01-12 NOTE — Progress Notes (Signed)
ED Pharmacy Antibiotic Sign Off An antibiotic consult was received from an ED provider for vancomycin and cefepime per pharmacy dosing for sepsis. A chart review was completed to assess appropriateness.   The following one time order(s) were placed:  Vancomycin 1g IV x 1 Cefepime 2g IV x 1  Further antibiotic and/or antibiotic pharmacy consults should be ordered by the admitting provider if indicated.   Thank you for allowing pharmacy to be a part of this patient's care.   Daylene Posey, [facility]  Clinical Pharmacist 01/12/23 2:30 PM

## 2023-01-12 NOTE — Progress Notes (Signed)
Pharmacy Antibiotic Note  Amy [name] is a 77 y.o. female admitted on 01/12/2023 presenting with sepsis from SNF.  Pharmacy has been consulted for cefepime dosing. Cefepime 2g IV x 1 given in ED, baseline SCr 0.6-0.7, now 3.39  Plan: Cefepime 1g IV q 24h Monitor renal function, Cx and clinical progression to narrow     Temp (24hrs), Avg:101 F (38.3 C), Min:99.2 F (37.3 C), Max:102.7 F (39.3 C)  Recent Labs  Lab 01/12/23 1422 01/12/23 1425 01/12/23 1556 01/12/23 1740  WBC 19.5*  --   --  25.3*  CREATININE  --   --  3.39*  --   LATICACIDVEN  --  3.5*  --   --     CrCl cannot be calculated (Unknown ideal weight.).    Allergies  Allergen Reactions   Ciprofloxacin Rash    Daylene Posey, PharmD, [facility] Clinical Pharmacist ED Pharmacist Phone # [phone] 01/12/2023 6:13 PM

## 2023-01-12 NOTE — Progress Notes (Signed)
eLink Physician-Brief Progress Note Patient Name: Amy [name] DOB: [birth year] MRN: [id number]   Date of Service  01/12/2023  HPI/Events of Note  Received request for sliding scale insulin. Current CBG 160 NPO  eICU Interventions  Low dose sliding scale insulin ordered q6     Intervention Category Intermediate Interventions: Hyperglycemia - evaluation and treatment  Rosalie Gums Elany Felix 01/12/2023, 8:02 PM

## 2023-01-12 NOTE — ED Provider Notes (Addendum)
[place] [facility]ERGENC[facility] DEPARTMENT AT [facility] Provider Note  CSN: [id number] Arrival date & time: 01/12/23 1407  Chief Complaint(s) No chief complaint on file.  HPI Amy [name] is a 77 y.o. female history of Parkinson's disease, dementia presenting to the emergency department with possible sepsis.  The patient has apparently been worsening over the past week, not eating or drinking.  She was reportedly being treated for urine infection at her nursing facility.  She was recently hospitalized after a fall and suffered a hip fracture.  She was discharged from the hospital to a skilled nursing facility.  Per EMS the nursing facility was not able to provide very much useful information.  Her family at bedside report that they have been visiting her this week and she has been just more more lethargic.  History very limited due to altered mental status   Past Medical History Past Medical History:  Diagnosis Date   Dyspnea on exertion    Elevated blood sugar    Hair loss    Hashimoto's thyroiditis    Hypothyroidism    Impaired fasting blood sugar    Osteoporosis    Palpitations    Parkinson's disease (HCC)    Parkinsonism (HCC)    Restless leg syndrome 06/21/2019   Ventricular premature beats    Weakness    Patient Active Problem List   Diagnosis Date Noted   Hypothyroidism 12/24/2022   Leukocytosis 12/24/2022   Dehydration 12/24/2022   Malnutrition of moderate degree 12/24/2022   Hip fracture (HCC) 12/23/2022   Restless leg syndrome 06/21/2019   Memory loss 01/29/2018   Parkinson's disease (HCC) 01/27/2017   Home Medication(s) Prior to Admission medications   Medication Sig Start Date End Date Taking? Authorizing Provider  amantadine (SYMMETREL) 100 MG capsule TAKE 1 CAPSULE BY MOUTH EVERY MORNING, AT NOON & AT BEDTIME 07/11/22   Ihor Austin, NP  aspirin EC 81 MG tablet Take 1 tablet (81 mg total) by mouth in the morning and at bedtime. Take after 12 weeks for  prevention of preeclampsia later in pregnancy 12/30/22 01/29/23  Wouk, Wilfred Curtis, MD  carbidopa-levodopa (SINEMET IR) 25-100 MG tablet TAKE 1 TABLET BY MOUTH 8 TIMES DAILY-6 AM, 8 AM, 10 AM, 12 PM, 2 PM, 4 PM, 6 PM AND 8 PM Patient taking differently: Take 1 tablet by mouth See admin instructions. Take 1 tablet by mouth every other hour or 1.5 hours 10/21/22   Huston Foley, MD  escitalopram (LEXAPRO) 20 MG tablet Take 1 tablet (20 mg total) by mouth daily. 03/16/20   York Spaniel, MD  oxyCODONE (OXY IR/ROXICODONE) 5 MG immediate release tablet Take 1 tablet (5 mg total) by mouth every 6 (six) hours as needed for breakthrough pain ((for MODERATE breakthrough pain)). 12/30/22   Wouk, Wilfred Curtis, MD  pramipexole (MIRAPEX) 1 MG tablet Take 1 tablet (1 mg total) by mouth 3 (three) times daily. 05/13/22   Ihor Austin, NP  SYNTHROID 75 MCG tablet Take 75 mcg by mouth daily before breakfast. 11/16/18   [provider]  Past Surgical History Past Surgical History:  Procedure Laterality Date   CATARACT EXTRACTION Bilateral    CESAREAN SECTION     x2   CHOLECYSTECTOMY     FOOT SURGERY Right 01/15/2016   INTRAMEDULLARY (IM) NAIL INTERTROCHANTERIC Right 12/24/2022   Procedure: INTRAMEDULLARY (IM) NAIL INTERTROCHANTERIC;  Surgeon: Luci Bank, MD;  Location: [facility] OR;  Service: Orthopedics;  Laterality: Right;   WRIST SURGERY  1999   Family History Family History  Problem Relation Age of Onset   Breast cancer Mother    Lung disease Brother    Parkinson's disease Neg Hx     Social History Social History   Tobacco Use   Smoking status: Never   Smokeless tobacco: Never  Vaping Use   Vaping status: Never Used  Substance Use Topics   Alcohol use: No   Drug use: No   Allergies Ciprofloxacin  Review of Systems Review of Systems  All other systems  reviewed and are negative.   Physical Exam Vital Signs  I have reviewed the triage vital signs BP (!) 107/54   Pulse 95   Temp (!) 102.7 F (39.3 C) (Rectal)   Resp (!) 28   SpO2 100%  Physical Exam Vitals and nursing note reviewed.  Constitutional:      General: She is in acute distress.     Appearance: She is ill-appearing and toxic-appearing.  HENT:     Head: Normocephalic and atraumatic.     Mouth/Throat:     Mouth: Mucous membranes are dry.  Eyes:     Conjunctiva/sclera: Conjunctivae normal.  Cardiovascular:     Rate and Rhythm: Regular rhythm. Tachycardia present.  Pulmonary:     Breath sounds: Normal breath sounds.     Comments: Tachypnea present Abdominal:     Tenderness: There is abdominal tenderness (Patient grimaces to palpation throughout abdomen.  Palpation of the lower abdomen seems to cause gas to be expelled from her pelvic region.).  Genitourinary:    Comments: Chaperoned by RN, no large wounds, brown stool mixed with blood in diaper Musculoskeletal:     Right lower leg: No edema.     Left lower leg: No edema.  Skin:    General: Skin is dry.  Neurological:     Mental Status: She is disoriented.     Comments: Obtunded, moaning  Psychiatric:     Comments: Unable to assess     ED Results and Treatments Labs (all labs ordered are listed, but only abnormal results are displayed) Labs Reviewed  CBC WITH DIFFERENTIAL/PLATELET - Abnormal; Notable for the following components:      Result Value   WBC 19.5 (*)    RBC 3.77 (*)    Hemoglobin 10.8 (*)    MCV 102.1 (*)    MCHC 28.1 (*)    RDW 16.4 (*)    Neutro Abs 16.6 (*)    Abs Immature Granulocytes 0.12 (*)    All other components within normal limits  PROTIME-INR - Abnormal; Notable for the following components:   Prothrombin Time 21.9 (*)    INR 1.9 (*)    All other components within normal limits  URINALYSIS, W/ REFLEX TO CULTURE (INFECTION SUSPECTED) - Abnormal; Notable for the following  components:   Color, Urine BROWN (*)    APPearance TURBID (*)    pH 8.5 (*)    Hgb urine dipstick LARGE (*)    Bilirubin Urine SMALL (*)    Protein, ur >300 (*)    Nitrite POSITIVE (*)  Leukocytes,Ua LARGE (*)    Bacteria, UA MANY (*)    All other components within normal limits  I-STAT CG4 LACTIC ACID, ED - Abnormal; Notable for the following components:   Lactic Acid, Venous 3.5 (*)    All other components within normal limits  CULTURE, BLOOD (ROUTINE X 2)  CULTURE, BLOOD (ROUTINE X 2)  URINE CULTURE  COMPREHENSIVE METABOLIC PANEL  I-STAT CG4 LACTIC ACID, ED  TYPE AND SCREEN                                                                                                                          Radiology DG Chest Port 1 View  Result Date: 01/12/2023 CLINICAL DATA:  Sepsis EXAM: PORTABLE CHEST 1 VIEW COMPARISON:  12/23/2022 FINDINGS: Marked patient rotation limits the exam. No acute airspace disease or pleural effusion. Stable cardiomediastinal silhouette. No pneumothorax IMPRESSION: No active disease. Marked patient rotation limits the exam. Electronically Signed   By: Jasmine Pang M.D.   On: 01/12/2023 15:27    Pertinent labs & imaging results that were available during my care of the patient were reviewed by me and considered in my medical decision making (see MDM for details).  Medications Ordered in ED Medications  lactated ringers infusion (has no administration in time range)  metroNIDAZOLE (FLAGYL) IVPB 500 mg (has no administration in time range)  vancomycin (VANCOCIN) IVPB 1000 mg/200 mL premix (has no administration in time range)  ceFEPIme (MAXIPIME) 2 g in sodium chloride 0.9 % 100 mL IVPB (2 g Intravenous New Bag/Given 01/12/23 1459)  lactated ringers bolus 1,470 mL (1,470 mLs Intravenous New Bag/Given 01/12/23 1453)  acetaminophen (TYLENOL) suppository 650 mg (650 mg Rectal Given 01/12/23 1451)                                                                                                                                      Procedures .Critical Care  Performed by: Lonell Grandchild, MD Authorized by: Lonell Grandchild, MD   Critical care provider statement:    Critical care time (minutes):  30   Critical care was necessary to treat or prevent imminent or life-threatening deterioration of the following conditions:  Sepsis   Critical care was time spent personally by me on the following activities:  Development of treatment plan with patient or surrogate, discussions with consultants, evaluation of patient's response to treatment, examination of  patient, ordering and review of laboratory studies, ordering and review of radiographic studies, ordering and performing treatments and interventions, pulse oximetry, re-evaluation of patient's condition and review of old charts   (including critical care time)  Medical Decision Making / ED Course   MDM:  77 year old female presenting to the emergency department with possible sepsis.  Patient critically ill-appearing, vitals notable for fever, hypotension, tachycardia and tachypnea.  Suspect sepsis likely secondary to UTI.  She has a very foul-smelling and turbid urinalysis.  Urinalysis is nitrate positive with bacteria and numerous white blood cells.  Palpation of her lower abdomen did cause some air to be expelled likely from her urethra.  CT shows significant air in her bladder pending formal read.  Could represent colovesicular fistula or gas-forming organism.  Cover broadly with antibiotics.  There is some report of rhonchi per EMS but I did not note this on my exam and her chest x-ray is clear.  CT also pending given abdominal tenderness to evaluate for any other intra-abdominal process.  Labs notable for significant leukocytosis, lactic acidosis.  Blood pressure did improve some with IV fluids.  Discussed with patient's family at the bedside.  Emphasized critical nature of patient's current illness.   She will definitely need to be admitted to the hospital.  Patient also did have some rectal bleeding but no large wound concerning for Fournier's gangrene or acute infectious process.  Hemoglobin is not significantly different from time of discharge to the hospital but given patient's severe dehydration this may be falsely elevated.  Will need to be trended.  Did require transfusion while in the hospital.   signed out to oncoming physician Dr. Rubin Payor pending CT abdomen, further monitoring, determination on disposition whether the hospitalist service or ICU pending continued improvement in clinical course.      Additional history obtained: -Additional history obtained from family and ems -External records from outside source obtained and reviewed including: Chart review including previous notes, labs, imaging, consultation notes including recent hospitalization   Lab Tests: -I ordered, reviewed, and interpreted labs.   The pertinent results include:   Labs Reviewed  CBC WITH DIFFERENTIAL/PLATELET - Abnormal; Notable for the following components:      Result Value   WBC 19.5 (*)    RBC 3.77 (*)    Hemoglobin 10.8 (*)    MCV 102.1 (*)    MCHC 28.1 (*)    RDW 16.4 (*)    Neutro Abs 16.6 (*)    Abs Immature Granulocytes 0.12 (*)    All other components within normal limits  PROTIME-INR - Abnormal; Notable for the following components:   Prothrombin Time 21.9 (*)    INR 1.9 (*)    All other components within normal limits  URINALYSIS, W/ REFLEX TO CULTURE (INFECTION SUSPECTED) - Abnormal; Notable for the following components:   Color, Urine BROWN (*)    APPearance TURBID (*)    pH 8.5 (*)    Hgb urine dipstick LARGE (*)    Bilirubin Urine SMALL (*)    Protein, ur >300 (*)    Nitrite POSITIVE (*)    Leukocytes,Ua LARGE (*)    Bacteria, UA MANY (*)    All other components within normal limits  I-STAT CG4 LACTIC ACID, ED - Abnormal; Notable for the following components:    Lactic Acid, Venous 3.5 (*)    All other components within normal limits  CULTURE, BLOOD (ROUTINE X 2)  CULTURE, BLOOD (ROUTINE X 2)  URINE CULTURE  COMPREHENSIVE  METABOLIC PANEL  I-STAT CG4 LACTIC ACID, ED  TYPE AND SCREEN    Notable for leukocytosis, lactic acidosis, UTI   EKG   EKG Interpretation Date/Time:  Sunday January 12 2023 14:10:27 EST Ventricular Rate:  101 PR Interval:  121 QRS Duration:  91 QT Interval:  345 QTC Calculation: 448 R Axis:   78  Text Interpretation: Sinus tachycardia Confirmed by Alvino Blood ([id number]) on 01/12/2023 3:09:32 PM         Imaging Studies ordered: I ordered imaging studies including CT abdomen On my interpretation imaging demonstrates air in bladder  I independently visualized and interpreted imaging. I agree with the radiologist interpretation   Medicines ordered and prescription drug management: Meds ordered this encounter  Medications   lactated ringers infusion   ceFEPIme (MAXIPIME) 2 g in sodium chloride 0.9 % 100 mL IVPB    Order Specific Question:   Antibiotic Indication:    Answer:   Other Indication (list below)    Order Specific Question:   Other Indication:    Answer:   Unknown source   metroNIDAZOLE (FLAGYL) IVPB 500 mg    Order Specific Question:   Antibiotic Indication:    Answer:   Other Indication (list below)    Order Specific Question:   Other Indication:    Answer:   Unknown source   vancomycin (VANCOCIN) IVPB 1000 mg/200 mL premix    Order Specific Question:   Indication:    Answer:   Other Indication (list below)    Order Specific Question:   Other Indication:    Answer:   Unknown source   lactated ringers bolus 1,470 mL   acetaminophen (TYLENOL) suppository 650 mg    -I have reviewed the patients home medicines and have made adjustments as needed  Cardiac Monitoring: The patient was maintained on a cardiac monitor.  I personally viewed and interpreted the cardiac monitored which showed an  underlying rhythm of: sinus tachycardia   Reevaluation: After the interventions noted above, I reevaluated the patient and found that their symptoms have improved  Co morbidities that complicate the patient evaluation  Past Medical History:  Diagnosis Date   Dyspnea on exertion    Elevated blood sugar    Hair loss    Hashimoto's thyroiditis    Hypothyroidism    Impaired fasting blood sugar    Osteoporosis    Palpitations    Parkinson's disease (HCC)    Parkinsonism (HCC)    Restless leg syndrome 06/21/2019   Ventricular premature beats    Weakness       Dispostion: Disposition decision including need for hospitalization was considered, and patient disposition pending at time of sign out.    Final Clinical Impression(s) / ED Diagnoses Final diagnoses:  Sepsis secondary to UTI [facility])     This chart was dictated using voice recognition software.  Despite best efforts to proofread,  errors can occur which can change the documentation meaning.    Lonell Grandchild, MD 01/12/23 1557    Lonell Grandchild, MD 01/12/23 1600

## 2023-01-12 NOTE — ED Triage Notes (Signed)
Patient arrives by EMS from [facility] for possible sepsis.  Patient currently being treated for UTI, last dose of antibiotics today. Patient with Rhonchi, staff unsure if patient aspirated.  Patient with history of parkinsons dementia.  EMS vitals  HR 104 BP 92/60 CBG 148 O2 95% on RA

## 2023-01-12 NOTE — ED Notes (Signed)
Pts IV in left AC infiltrated, pharmacist notified, advised to put compression on the site.

## 2023-01-12 NOTE — ED Provider Notes (Addendum)
  Physical Exam  BP (!) 107/54   Pulse 95   Temp (!) 102.7 F (39.3 C) (Rectal)   Resp (!) 28   SpO2 100%   Physical Exam  Procedures  Procedures  ED Course / MDM    Medical Decision Making Amount and/or Complexity of Data Reviewed Labs: ordered. Radiology: ordered.  Risk OTC drugs. Prescription drug management. Decision regarding hospitalization.   Past Medical History:  Diagnosis Date   Dyspnea on exertion    Elevated blood sugar    Hair loss    Hashimoto's thyroiditis    Hypothyroidism    Impaired fasting blood sugar    Osteoporosis    Palpitations    Parkinson's disease (HCC)    Parkinsonism (HCC)    Restless leg syndrome 06/21/2019   Ventricular premature beats    Weakness    Past Surgical History:  Procedure Laterality Date   CATARACT EXTRACTION Bilateral    CESAREAN SECTION     x2   CHOLECYSTECTOMY     FOOT SURGERY Right 01/15/2016   INTRAMEDULLARY (IM) NAIL INTERTROCHANTERIC Right 12/24/2022   Procedure: INTRAMEDULLARY (IM) NAIL INTERTROCHANTERIC;  Surgeon: Luci Bank, MD;  Location: [facility] OR;  Service: Orthopedics;  Laterality: Right;   WRIST SURGERY  1999    Received in signout.  Hypotension fever sepsis.  Did have recent hip surgery.  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  I discussed with radiologist.  Also independently interpreted the CT scan.  Lactic acid elevated hypotensive on peripheral Levophed.  Discussed with patient's son and husband.  Would not want extraordinary measures such as intubation or central line but will treat for now.  Has new renal failure.  Fluid bolus given and still hypotensive.  Will discuss with intensivist.  Do not think the patient is a surgical candidate at this time.  CRITICAL CARE Performed by: Benjiman Core Total critical care time: 30 minutes Critical care time was exclusive of separately billable procedures and treating other patients. Critical care was necessary to treat or prevent imminent or  life-threatening deterioration. Critical care was time spent personally by me on the following activities: development of treatment plan with patient and/or surrogate as well as nursing, discussions with consultants, evaluation of patient's response to treatment, examination of patient, obtaining history from patient or surrogate, ordering and performing treatments and interventions, ordering and review of laboratory studies, ordering and review of radiographic studies, pulse oximetry and re-evaluation of patient's condition.        Benjiman Core, MD 01/12/23 1651    Benjiman Core, MD 01/12/23 [phone]

## 2023-01-12 NOTE — Sepsis Progress Note (Addendum)
Sepsis protocol monitored by eLink  Sent secure chat to bedside RN at 1641 asking if second lactic acid has been drawn. Waiting for reply

## 2023-01-12 NOTE — H&P (Signed)
NAME:  Amy [name], MRN:  [id number], DOB:  09/24/1945, LOS: 0 ADMISSION DATE:  01/12/2023, CONSULTATION DATE: 01/12/2023 REFERRING MD: Dr. Rubin Payor, CHIEF COMPLAINT: Sepsis, septic shock, hyponatremia  History of Present Illness:  Brought in for possible sepsis Worsening over the last 2 weeks Recently had a hip fracture for which she had surgery discharged from the hospital to skilled nursing facility, decreased intake Decreased interaction Concern for urinary tract infection recently, started on antibiotics Has not been eating or drinking much Increasing lethargy led to her being brought to the hospital  Pertinent  Medical History   Past Medical History:  Diagnosis Date   Dyspnea on exertion    Elevated blood sugar    Hair loss    Hashimoto's thyroiditis    Hypothyroidism    Impaired fasting blood sugar    Osteoporosis    Palpitations    Parkinson's disease (HCC)    Parkinsonism (HCC)    Restless leg syndrome 06/21/2019   Ventricular premature beats    Weakness    Significant Hospital Events: Including procedures, antibiotic start and stop dates in addition to other pertinent events   CT abdomen-air in the bladder, significant stool burden  Interim History / Subjective:  Very frail, chronically ill-appearing  Objective   Blood pressure 111/81, pulse (!) 106, temperature 99.2 F (37.3 C), resp. rate (!) 26, SpO2 100%.        Intake/Output Summary (Last 24 hours) at 01/12/2023 1754 Last data filed at 01/12/2023 1723 Gross per 24 hour  Intake 100 ml  Output --  Net 100 ml   There were no vitals filed for this visit.  Examination: General: Elderly, chronically ill-appearing HENT: Dry oral mucosa Lungs: Decreased air movement bilaterally Cardiovascular: S1-S2 appreciated Abdomen: Soft, bowel sounds appreciated Extremities: No clubbing, no edema Neuro: Awake, not following commands GU:   Resolved Hospital Problem list    Assessment & Plan:  Septic  shock -May be secondary to urinary tract infection -Fluid resuscitation -Continue pressors -Follow cultures  Acute kidney injury -Likely prerenal -Continue fluid resuscitation -Renal dose medications  Hypernatremia -Monitor closely -Avoid overcorrection  Concern for colovesical fistula -Will need surgical consultation  Severe protein calorie malnutrition Failure to thrive -Will need cortrak placed in AM -Caution as she may have refeeding  Resume medications for Parkinson's when able to place cortrak  History of hypothyroidism -Synthroid when able to place cortrak   Best Practice (right click and "Reselect all SmartList Selections" daily)   Diet/type: NPO DVT prophylaxis: prophylactic heparin  GI prophylaxis: N/A Lines: N/A Foley:  N/A Code Status:  limited Last date of multidisciplinary goals of care discussion [discussed with family at bedside]  Labs   CBC: Recent Labs  Lab 01/12/23 1422  WBC 19.5*  NEUTROABS 16.6*  HGB 10.8*  HCT 38.5  MCV 102.1*  PLT 260    Basic Metabolic Panel: Recent Labs  Lab 01/12/23 1556  NA 163*  K 4.6  CL >130*  CO2 13*  GLUCOSE 147*  BUN 147*  CREATININE 3.39*  CALCIUM 7.9*   GFR: CrCl cannot be calculated (Unknown ideal weight.). Recent Labs  Lab 01/12/23 1422 01/12/23 1425  WBC 19.5*  --   LATICACIDVEN  --  3.5*    Liver Function Tests: Recent Labs  Lab 01/12/23 1556  AST 58*  ALT 21  ALKPHOS 174*  BILITOT 1.6*  PROT 6.3*  ALBUMIN 2.7*   No results for input(s): "LIPASE", "AMYLASE" in the last 168 hours. No results for input(s): "AMMONIA"  in the last 168 hours.  ABG No results found for: "PHART", "PCO2ART", "PO2ART", "HCO3", "TCO2", "ACIDBASEDEF", "O2SAT"   Coagulation Profile: Recent Labs  Lab 01/12/23 1422  INR 1.9*    Cardiac Enzymes: No results for input(s): "CKTOTAL", "CKMB", "CKMBINDEX", "TROPONINI" in the last 168 hours.  HbA1C: No results found for: "HGBA1C"  CBG: No  results for input(s): "GLUCAP" in the last 168 hours.  Review of Systems:   Increasing lethargy  Past Medical History:  She,  has a past medical history of Dyspnea on exertion, Elevated blood sugar, Hair loss, Hashimoto's thyroiditis, Hypothyroidism, Impaired fasting blood sugar, Osteoporosis, Palpitations, Parkinson's disease (HCC), Parkinsonism (HCC), Restless leg syndrome (06/21/2019), Ventricular premature beats, and Weakness.   Surgical History:   Past Surgical History:  Procedure Laterality Date   CATARACT EXTRACTION Bilateral    CESAREAN SECTION     x2   CHOLECYSTECTOMY     FOOT SURGERY Right 01/15/2016   INTRAMEDULLARY (IM) NAIL INTERTROCHANTERIC Right 12/24/2022   Procedure: INTRAMEDULLARY (IM) NAIL INTERTROCHANTERIC;  Surgeon: Luci Bank, MD;  Location: [facility] OR;  Service: Orthopedics;  Laterality: Right;   WRIST SURGERY  1999     Social History:   reports that she has never smoked. She has never used smokeless tobacco. She reports that she does not drink alcohol and does not use drugs.   Family History:  Her family history includes Breast cancer in her mother; Lung disease in her brother. There is no history of Parkinson's disease.   Allergies Allergies  Allergen Reactions   Ciprofloxacin Rash     The patient is critically ill with multiple organ systems failure and requires high complexity decision making for assessment and support, frequent evaluation and titration of therapies, application of advanced monitoring technologies and extensive interpretation of multiple databases. Critical Care Time devoted to patient care services described in this note independent of APP/resident time (if applicable)  is 40 minutes.   Virl Diamond MD [facility] Personal pager: See Amion If unanswered, please page CCM On-call: #[phone]

## 2023-01-12 NOTE — ED Notes (Signed)
ED TO INPATIENT HANDOFF REPORT  ED Nurse Name and Phone #: Darral Dash RN [phone]  S Name/Age/Gender Amy [name] 77 y.o. female Room/Bed: 034C/034C  Code Status   Code Status: Limited: Do not attempt resuscitation (DNR) -DNR-LIMITED -Do Not Intubate/DNI   Home/SNF/Other Nursing Home A/OX0 Is this baseline? No    Triage Complete: Triage complete  Chief Complaint Septic shock (HCC) [A41.9, R65.21]  Triage Note Patient arrives by EMS from [facility] for possible sepsis.  Patient currently being treated for UTI, last dose of antibiotics today. Patient with Rhonchi, staff unsure if patient aspirated.  Patient with history of parkinsons dementia.  EMS vitals  HR 104 BP 92/60 CBG 148 O2 95% on RA   Allergies Allergies  Allergen Reactions   Ciprofloxacin Rash    Level of Care/Admitting Diagnosis ED Disposition     ED Disposition  Admit   Condition  --   Comment  Hospital Area: [facility] [[id number]]  Level of Care: ICU [6]  May admit patient to [facility] or [facility] if equivalent level of care is available:: Yes  Covid Evaluation: Asymptomatic - no recent exposure (last 10 days) testing not required  Diagnosis: Septic shock [facility]) [[id number]]  Admitting Physician: Tomma Lightning [[id number]]  Attending Physician: Tomma Lightning [id number]  Certification:: I certify this patient will need inpatient services for at least 2 midnights  Expected Medical Readiness: 01/16/2023          B Medical/Surgery History Past Medical History:  Diagnosis Date   Dyspnea on exertion    Elevated blood sugar    Hair loss    Hashimoto's thyroiditis    Hypothyroidism    Impaired fasting blood sugar    Osteoporosis    Palpitations    Parkinson's disease (HCC)    Parkinsonism (HCC)    Restless leg syndrome 06/21/2019   Ventricular premature beats    Weakness    Past Surgical History:  Procedure Laterality Date   CATARACT EXTRACTION Bilateral    CESAREAN  SECTION     x2   CHOLECYSTECTOMY     FOOT SURGERY Right 01/15/2016   INTRAMEDULLARY (IM) NAIL INTERTROCHANTERIC Right 12/24/2022   Procedure: INTRAMEDULLARY (IM) NAIL INTERTROCHANTERIC;  Surgeon: Luci Bank, MD;  Location: [facility] OR;  Service: Orthopedics;  Laterality: Right;   WRIST SURGERY  1999     A IV Location/Drains/Wounds Patient Lines/Drains/Airways Status     Active Line/Drains/Airways     Name Placement date Placement time Site Days   Peripheral IV 01/12/23 20 G Posterior;Right Hand 01/12/23  --  Hand  less than 1   Peripheral IV 01/12/23 18 G Right Antecubital 01/12/23  1630  Antecubital  less than 1            Intake/Output Last 24 hours  Intake/Output Summary (Last 24 hours) at 01/12/2023 1810 Last data filed at 01/12/2023 1723 Gross per 24 hour  Intake 100 ml  Output --  Net 100 ml    Labs/Imaging Results for orders placed or performed during the hospital encounter of 01/12/23 (from the past 48 hour(s))  CBC with Differential     Status: Abnormal   Collection Time: 01/12/23  2:22 PM  Result Value Ref Range   WBC 19.5 (H) 4.0 - 10.5 K/uL   RBC 3.77 (L) 3.87 - 5.11 MIL/uL   Hemoglobin 10.8 (L) 12.0 - 15.0 g/dL   HCT 78.2 95.6 - 21.3 %   MCV 102.1 (H) 80.0 - 100.0 fL   MCH  28.6 26.0 - 34.0 pg   MCHC 28.1 (L) 30.0 - 36.0 g/dL   RDW 09.8 (H) 11.9 - 14.7 %   Platelets 260 150 - 400 K/uL   nRBC 0.1 0.0 - 0.2 %   Neutrophils Relative % 85 %   Neutro Abs 16.6 (H) 1.7 - 7.7 K/uL   Lymphocytes Relative 10 %   Lymphs Abs 2.0 0.7 - 4.0 K/uL   Monocytes Relative 4 %   Monocytes Absolute 0.8 0.1 - 1.0 K/uL   Eosinophils Relative 0 %   Eosinophils Absolute 0.0 0.0 - 0.5 K/uL   Basophils Relative 0 %   Basophils Absolute 0.0 0.0 - 0.1 K/uL   Immature Granulocytes 1 %   Abs Immature Granulocytes 0.12 (H) 0.00 - 0.07 K/uL    Comment: Performed at [facility], [address] [place], [place] [address]  Protime-INR     Status: Abnormal   Collection  Time: 01/12/23  2:22 PM  Result Value Ref Range   Prothrombin Time 21.9 (H) 11.4 - 15.2 seconds   INR 1.9 (H) 0.8 - 1.2    Comment: (NOTE) INR goal varies based on device and disease states. Performed at [facility], [address] [place], [place] [address]   Urinalysis, w/ Reflex to Culture (Infection Suspected) -Urine, Catheterized     Status: Abnormal   Collection Time: 01/12/23  2:22 PM  Result Value Ref Range   Specimen Source URINE, CATHETERIZED    Color, Urine BROWN (A) YELLOW    Comment: BIOCHEMICALS MAY BE AFFECTED BY COLOR   APPearance TURBID (A) CLEAR   Specific Gravity, Urine 1.020 1.005 - 1.030   pH 8.5 (H) 5.0 - 8.0   Glucose, UA NEGATIVE NEGATIVE mg/dL   Hgb urine dipstick LARGE (A) NEGATIVE   Bilirubin Urine SMALL (A) NEGATIVE   Ketones, ur NEGATIVE NEGATIVE mg/dL   Protein, ur >657 (A) NEGATIVE mg/dL   Nitrite POSITIVE (A) NEGATIVE   Leukocytes,Ua LARGE (A) NEGATIVE   Squamous Epithelial / HPF 0-5 0 - 5 /HPF   WBC, UA >50 0 - 5 WBC/hpf    Comment: Reflex urine culture not performed if WBC <=10, OR if Squamous epithelial cells >5. If Squamous epithelial cells >5, suggest recollection.   RBC / HPF 21-50 0 - 5 RBC/hpf   Bacteria, UA MANY (A) NONE SEEN   Amorphous Crystal PRESENT    Triple Phosphate Crystal PRESENT     Comment: Performed at [facility], [address] [place], [place] [address]  I-Stat Lactic Acid, ED     Status: Abnormal   Collection Time: 01/12/23  2:25 PM  Result Value Ref Range   Lactic Acid, Venous 3.5 (HH) 0.5 - 1.9 mmol/L   Comment NOTIFIED PHYSICIAN   Type and screen [facility]     Status: None   Collection Time: 01/12/23  3:50 PM  Result Value Ref Range   ABO/RH(D) O POS    Antibody Screen POS    Sample Expiration      01/15/2023,2359 Performed at [facility], [address] [place], [place] [address]   Comprehensive metabolic panel     Status: Abnormal   Collection Time: 01/12/23  3:56  PM  Result Value Ref Range   Sodium 163 (HH) 135 - 145 mmol/L    Comment: CRITICAL RESULT CALLED TO, READ BACK BY AND VERIFIED WITH Affan Callow, E. RN @1641  01/12/23 SATRAINR   Potassium 4.6 3.5 - 5.1  mmol/L   Chloride >130 (HH) 98 - 111 mmol/L    Comment: CALLED TO Karstyn Birkey, E. RN OKAY TO RELEASE @1641  01/12/23 SATRAINR   CO2 13 (L) 22 - 32 mmol/L   Glucose, Bld 147 (H) 70 - 99 mg/dL    Comment: Glucose reference range applies only to samples taken after fasting for at least 8 hours.   BUN 147 (H) 8 - 23 mg/dL   Creatinine, Ser 1.61 (H) 0.44 - 1.00 mg/dL   Calcium 7.9 (L) 8.9 - 10.3 mg/dL   Total Protein 6.3 (L) 6.5 - 8.1 g/dL   Albumin 2.7 (L) 3.5 - 5.0 g/dL   AST 58 (H) 15 - 41 U/L   ALT 21 0 - 44 U/L   Alkaline Phosphatase 174 (H) 38 - 126 U/L   Total Bilirubin 1.6 (H) 0.3 - 1.2 mg/dL   GFR, Estimated 14 (L) >60 mL/min    Comment: (NOTE) Calculated using the CKD-EPI Creatinine Equation (2021)    Anion gap NOT CALCULATED 5 - 15    Comment: REPEATED TO VERIFY Performed at [facility], [address] [place], [place] [address]   CBC with Differential     Status: Abnormal (Preliminary result)   Collection Time: 01/12/23  5:40 PM  Result Value Ref Range   WBC 25.3 (H) 4.0 - 10.5 K/uL   RBC 4.90 3.87 - 5.11 MIL/uL   Hemoglobin 13.8 12.0 - 15.0 g/dL    Comment: REPEATED TO VERIFY   HCT 48.0 (H) 36.0 - 46.0 %   MCV 98.0 80.0 - 100.0 fL   MCH 28.2 26.0 - 34.0 pg   MCHC 28.8 (L) 30.0 - 36.0 g/dL   RDW 54.0 (H) 98.1 - 19.1 %   Platelets 315 150 - 400 K/uL   nRBC 0.1 0.0 - 0.2 %    Comment: Performed at [facility], [address], [place], [place] [address]   Neutrophils Relative % PENDING %   Neutro Abs PENDING 1.7 - 7.7 K/uL   Band Neutrophils PENDING %   Lymphocytes Relative PENDING %   Lymphs Abs PENDING 0.7 - 4.0 K/uL   Monocytes Relative PENDING %   Monocytes Absolute PENDING 0.1 - 1.0 K/uL   Eosinophils Relative PENDING %   Eosinophils Absolute PENDING 0.0 -  0.5 K/uL   Basophils Relative PENDING %   Basophils Absolute PENDING 0.0 - 0.1 K/uL   WBC Morphology PENDING    RBC Morphology PENDING    Smear Review PENDING    Other PENDING %   nRBC PENDING 0 /100 WBC   Metamyelocytes Relative PENDING %   Myelocytes PENDING %   Promyelocytes Relative PENDING %   Blasts PENDING %   Immature Granulocytes PENDING %   Abs Immature Granulocytes PENDING 0.00 - 0.07 K/uL   CT ABDOMEN PELVIS WO CONTRAST  Result Date: 01/12/2023 CLINICAL DATA:  Sepsis. Air from introitus on direct pelvic pressure. Recent right hip fracture fixation. EXAM: CT ABDOMEN AND PELVIS WITHOUT CONTRAST TECHNIQUE: Multidetector CT imaging of the abdomen and pelvis was performed following the standard protocol without IV contrast. RADIATION DOSE REDUCTION: This exam was performed according to the departmental dose-optimization program which includes automated exposure control, adjustment of the mA and/or kV according to patient size and/or use of iterative reconstruction technique. COMPARISON:  Chest radiographs 01/12/2023. Abdominal radiographs 07/10/2019. Cardiac CT 08/13/2018. FINDINGS: Despite efforts by the technologist and patient, mild motion artifact is present on today's exam and could not be eliminated.  This reduces exam sensitivity and specificity. Lower chest: Clear lung bases. No significant pleural or pericardial effusion. Hepatobiliary: 4.4 cm cyst posteriorly in the dome of the right hepatic lobe has enlarged from the previous cardiac CT, although demonstrates no aggressive characteristics on noncontrast imaging. There is a calcified granuloma anteriorly in the right lobe. Patient is status post remote cholecystectomy with a low-density collection in the cholecystectomy bed, measuring 3.7 x 2.6 cm on image 21/3, not previously imaged. No evidence of biliary dilatation or pneumobilia. Pancreas:  Not well visualized.  No focal abnormality identified. Spleen: Normal in size without  focal abnormality. Adrenals/Urinary Tract: Both adrenal glands appear normal. No evidence of urinary tract calculus, suspicious renal lesion or hydronephrosis. There is no air within the renal collecting systems or ureters. However, there is a large amount of air within the urinary bladder lumen. There is a small amount of high density material dependently in the right bladder lumen. No bladder wall thickening, surrounding inflammation or obvious colovesical fistula identified. Stomach/Bowel: No enteric contrast administered. The stomach appears unremarkable for its degree of distention. No small bowel distension, wall thickening or surrounding inflammation identified. There is a large amount of high density stool throughout the colon, especially within the rectum. No rectal wall thickening or surrounding inflammation is seen. As above, no obvious colovesical fistula. There are some sigmoid colon diverticular changes. Vascular/Lymphatic: There are no enlarged abdominal or pelvic lymph nodes. Aortic and branch vessel atherosclerosis without evidence of aneurysm. Reproductive: Uterine atrophy or surgical absence. No suspicious adnexal findings. Other: No evidence of abdominal wall mass or hernia. No ascites or pneumoperitoneum. Musculoskeletal: Postsurgical changes from recent right femoral ORIF for an intertrochanteric fracture. The right hip is abducted. Age-indeterminate T12 compression fracture appears new from 2020 and is associated with mild osseous retropulsion and possible mild paraspinal edema. No other acute osseous findings. There is mild multilevel spondylosis. IMPRESSION: 1. Large amount of air within the urinary bladder lumen, nonspecific but suggestive of a colovesical fistula in this clinical context. No bladder wall thickening, surrounding inflammation or obvious colovesical fistula identified. 2. Large amount of high density stool throughout the colon, especially within the rectum, consistent with  constipation. No evidence of bowel obstruction or inflammation. 3. Age-indeterminate T12 compression fracture, new from 2020. Correlate with any pain at this level. 4. Status post remote cholecystectomy with a low-density collection in the cholecystectomy bed, not previously imaged. This could reflect a postoperative seroma or biloma. 5. Postsurgical changes from recent right femoral ORIF for an intertrochanteric fracture. 6.  Aortic Atherosclerosis (ICD10-I70.0). Electronically Signed   By: Carey Bullocks M.D.   On: 01/12/2023 16:26   DG Chest Port 1 View  Result Date: 01/12/2023 CLINICAL DATA:  Sepsis EXAM: PORTABLE CHEST 1 VIEW COMPARISON:  12/23/2022 FINDINGS: Marked patient rotation limits the exam. No acute airspace disease or pleural effusion. Stable cardiomediastinal silhouette. No pneumothorax IMPRESSION: No active disease. Marked patient rotation limits the exam. Electronically Signed   By: Jasmine Pang M.D.   On: 01/12/2023 15:27    Pending Labs Unresulted Labs (From admission, onward)     Start     Ordered   01/13/23 0500  CBC  Tomorrow morning,   R        01/12/23 1753   01/12/23 1740  Comprehensive metabolic panel  ONCE - STAT,   STAT        01/12/23 1740   01/12/23 1422  Culture, blood (Routine x 2)  BLOOD CULTURE X 2,  R (with STAT occurrences)      01/12/23 1421   01/12/23 1422  Urine Culture  Once,   R        01/12/23 1422            Vitals/Pain Today's Vitals   01/12/23 1710 01/12/23 1725 01/12/23 1730 01/12/23 1745  BP: 118/73 103/70 123/79 111/81  Pulse: (!) 110 (!) 109 (!) 116 (!) 106  Resp: (!) 29 (!) 29 (!) 28 (!) 26  Temp:    99.2 F (37.3 C)  TempSrc:      SpO2: 100% 100% 100% 100%    Isolation Precautions No active isolations  Medications Medications  lactated ringers infusion ( Intravenous New Bag/Given 01/12/23 1637)  vancomycin (VANCOCIN) IVPB 1000 mg/200 mL premix (1,000 mg Intravenous New Bag/Given 01/12/23 1731)  0.9 %  sodium chloride  infusion (250 mLs Intravenous Not Given 01/12/23 1731)  norepinephrine (LEVOPHED) 4mg  in (0.016 mg/mL) premix infusion (4 mcg/min Intravenous Rate/Dose Change 01/12/23 1650)  docusate sodium (COLACE) capsule 100 mg (has no administration in time range)  polyethylene glycol (MIRALAX / GLYCOLAX) packet 17 g (has no administration in time range)  heparin injection 5,000 Units (has no administration in time range)  metroNIDAZOLE (FLAGYL) IVPB 500 mg (has no administration in time range)  ceFEPIme (MAXIPIME) 2 g in sodium chloride 0.9 % 100 mL IVPB (0 g Intravenous Stopped 01/12/23 1622)  metroNIDAZOLE (FLAGYL) IVPB 500 mg (0 mg Intravenous Stopped 01/12/23 1723)  lactated ringers bolus 1,470 mL (0 mLs Intravenous Stopped 01/12/23 1638)  acetaminophen (TYLENOL) suppository 650 mg (650 mg Rectal Given 01/12/23 1451)  lactated ringers bolus 1,000 mL (1,000 mLs Intravenous New Bag/Given 01/12/23 1731)    Mobility non-ambulatory      Focused Assessments     R Recommendations: See Admitting Provider Note  Report given to:   Additional Notes:

## 2023-01-12 NOTE — Progress Notes (Signed)
eLink Physician-Brief Progress Note Patient Name: Amy [name] DOB: 10/04/1945 MRN: [id number]   Date of Service  01/12/2023  HPI/Events of Note  Na 160 from 163 in approximately 3 hours Lactic acid 2.2 from 3.5 Creatinine improving  eICU Interventions  Her rise in Na was likely acute from dehydration but would still want to avoid overcorrection. Decrease LR to 75 cc an hour and will recheck Na level in 4 hours Discussed with bedside RN     Intervention Category Intermediate Interventions: Electrolyte abnormality - evaluation and management  Darl Pikes 01/12/2023, 9:40 PM

## 2023-01-13 ENCOUNTER — Inpatient Hospital Stay (HOSPITAL_COMMUNITY): Payer: Medicare Other

## 2023-01-13 DIAGNOSIS — N179 Acute kidney failure, unspecified: Secondary | ICD-10-CM | POA: Diagnosis not present

## 2023-01-13 DIAGNOSIS — E870 Hyperosmolality and hypernatremia: Secondary | ICD-10-CM

## 2023-01-13 DIAGNOSIS — E43 Unspecified severe protein-calorie malnutrition: Secondary | ICD-10-CM | POA: Diagnosis present

## 2023-01-13 DIAGNOSIS — R6521 Severe sepsis with septic shock: Secondary | ICD-10-CM | POA: Diagnosis not present

## 2023-01-13 DIAGNOSIS — A419 Sepsis, unspecified organism: Secondary | ICD-10-CM | POA: Diagnosis not present

## 2023-01-13 LAB — TYPE AND SCREEN
ABO/RH(D): O POS
ABO/RH(D): O POS
Antibody Screen: POSITIVE
Antibody Screen: POSITIVE

## 2023-01-13 LAB — GLUCOSE, CAPILLARY
Glucose-Capillary: 134 mg/dL — ABNORMAL HIGH (ref 70–99)
Glucose-Capillary: 120 mg/dL — ABNORMAL HIGH (ref 70–99)
Glucose-Capillary: 118 mg/dL — ABNORMAL HIGH (ref 70–99)
Glucose-Capillary: 123 mg/dL — ABNORMAL HIGH (ref 70–99)
Glucose-Capillary: 138 mg/dL — ABNORMAL HIGH (ref 70–99)
Glucose-Capillary: 193 mg/dL — ABNORMAL HIGH (ref 70–99)

## 2023-01-13 LAB — BASIC METABOLIC PANEL
Anion gap: 12 (ref 5–15)
Anion gap: 8 (ref 5–15)
BUN: 108 mg/dL — ABNORMAL HIGH (ref 8–23)
BUN: 61 mg/dL — ABNORMAL HIGH (ref 8–23)
BUN: 69 mg/dL — ABNORMAL HIGH (ref 8–23)
BUN: 81 mg/dL — ABNORMAL HIGH (ref 8–23)
BUN: 89 mg/dL — ABNORMAL HIGH (ref 8–23)
CO2: 19 mmol/L — ABNORMAL LOW (ref 22–32)
CO2: 19 mmol/L — ABNORMAL LOW (ref 22–32)
CO2: 19 mmol/L — ABNORMAL LOW (ref 22–32)
CO2: 21 mmol/L — ABNORMAL LOW (ref 22–32)
CO2: 22 mmol/L (ref 22–32)
Calcium: 7.8 mg/dL — ABNORMAL LOW (ref 8.9–10.3)
Calcium: 7.8 mg/dL — ABNORMAL LOW (ref 8.9–10.3)
Calcium: 7.9 mg/dL — ABNORMAL LOW (ref 8.9–10.3)
Calcium: 7.9 mg/dL — ABNORMAL LOW (ref 8.9–10.3)
Calcium: 7.9 mg/dL — ABNORMAL LOW (ref 8.9–10.3)
Chloride: 126 mmol/L — ABNORMAL HIGH (ref 98–111)
Chloride: 130 mmol/L — ABNORMAL HIGH (ref 98–111)
Chloride: >130 mmol/L (ref 98–111)
Chloride: >130 mmol/L (ref 98–111)
Chloride: >130 mmol/L (ref 98–111)
Creatinine, Ser: 1.05 mg/dL — ABNORMAL HIGH (ref 0.44–1.00)
Creatinine, Ser: 1.24 mg/dL — ABNORMAL HIGH (ref 0.44–1.00)
Creatinine, Ser: 1.48 mg/dL — ABNORMAL HIGH (ref 0.44–1.00)
Creatinine, Ser: 1.66 mg/dL — ABNORMAL HIGH (ref 0.44–1.00)
Creatinine, Ser: 2.22 mg/dL — ABNORMAL HIGH (ref 0.44–1.00)
GFR, Estimated: 23 mL/min — ABNORMAL LOW (ref >60)
GFR, Estimated: 32 mL/min — ABNORMAL LOW (ref >60)
GFR, Estimated: 37 mL/min — ABNORMAL LOW (ref >60)
GFR, Estimated: 45 mL/min — ABNORMAL LOW (ref >60)
GFR, Estimated: 55 mL/min — ABNORMAL LOW (ref >60)
Glucose, Bld: 123 mg/dL — ABNORMAL HIGH (ref 70–99)
Glucose, Bld: 126 mg/dL — ABNORMAL HIGH (ref 70–99)
Glucose, Bld: 140 mg/dL — ABNORMAL HIGH (ref 70–99)
Glucose, Bld: 177 mg/dL — ABNORMAL HIGH (ref 70–99)
Glucose, Bld: 192 mg/dL — ABNORMAL HIGH (ref 70–99)
Potassium: 3.3 mmol/L — ABNORMAL LOW (ref 3.5–5.1)
Potassium: 3.5 mmol/L (ref 3.5–5.1)
Potassium: 3.6 mmol/L (ref 3.5–5.1)
Potassium: 3.7 mmol/L (ref 3.5–5.1)
Potassium: 4 mmol/L (ref 3.5–5.1)
Sodium: 156 mmol/L — ABNORMAL HIGH (ref 135–145)
Sodium: 161 mmol/L (ref 135–145)
Sodium: 161 mmol/L (ref 135–145)
Sodium: 163 mmol/L (ref 135–145)
Sodium: 163 mmol/L (ref 135–145)

## 2023-01-13 LAB — CBC
HCT: 36.1 % (ref 36.0–46.0)
Hemoglobin: 11 g/dL — ABNORMAL LOW (ref 12.0–15.0)
MCH: 28.8 pg (ref 26.0–34.0)
MCHC: 30.5 g/dL (ref 30.0–36.0)
MCV: 94.5 fL (ref 80.0–100.0)
Platelets: 268 10*3/uL (ref 150–400)
RBC: 3.82 MIL/uL — ABNORMAL LOW (ref 3.87–5.11)
RDW: 16.1 % — ABNORMAL HIGH (ref 11.5–15.5)
WBC: 20.6 10*3/uL — ABNORMAL HIGH (ref 4.0–10.5)
nRBC: 0 % (ref 0.0–0.2)

## 2023-01-13 MED ORDER — DEXTROSE-SODIUM CHLORIDE 5-0.45 % IV SOLN: INTRAVENOUS | Status: DC

## 2023-01-13 MED ORDER — FENTANYL CITRATE PF 50 MCG/ML IJ SOSY
12.5000 ug | PREFILLED_SYRINGE | Freq: Once | INTRAMUSCULAR | Status: AC
  Administered 2023-01-13: 25 ug via INTRAVENOUS
  Filled 2023-01-13: qty 1

## 2023-01-13 MED ORDER — OXYCODONE HCL 5 MG PO TABS: 2.5000 mg | ORAL_TABLET | Freq: Three times a day (TID) | ORAL | Status: DC | PRN

## 2023-01-13 MED ORDER — OXYCODONE HCL 5 MG PO TABS
5.0000 mg | ORAL_TABLET | Freq: Three times a day (TID) | ORAL | Status: DC | PRN
  Administered 2023-01-14 – 2023-01-15 (×2): 5 mg
  Filled 2023-01-13 (×2): qty 1

## 2023-01-13 MED ORDER — SODIUM CHLORIDE 0.9% FLUSH
3.0000 mL | Freq: Two times a day (BID) | INTRAVENOUS | Status: DC
Start: 2023-01-13 — End: 2023-01-15
  Administered 2023-01-13 – 2023-01-15 (×3): 3 mL via INTRAVENOUS

## 2023-01-13 MED ORDER — POTASSIUM CHLORIDE 20 MEQ PO PACK
40.0000 meq | PACK | Freq: Once | ORAL | Status: AC
  Administered 2023-01-13: 40 meq
  Filled 2023-01-13: qty 2

## 2023-01-13 MED ORDER — OSMOLITE 1.2 CAL PO LIQD
1000.0000 mL | ORAL | Status: DC
  Administered 2023-01-13: 1000 mL
  Filled 2023-01-13 (×2): qty 1000

## 2023-01-13 MED ORDER — ORAL CARE MOUTH RINSE: 15.0000 mL | OROMUCOSAL | Status: DC | PRN

## 2023-01-13 MED ORDER — LACTATED RINGERS IV SOLN: INTRAVENOUS | Status: DC

## 2023-01-13 MED ORDER — OXYCODONE HCL 5 MG PO TABS
2.5000 mg | ORAL_TABLET | Freq: Three times a day (TID) | ORAL | Status: DC | PRN
  Administered 2023-01-13 – 2023-01-14 (×2): 2.5 mg
  Filled 2023-01-13 (×2): qty 1

## 2023-01-13 MED ORDER — FREE WATER
200.0000 mL | Status: DC
  Administered 2023-01-13 – 2023-01-15 (×11): 200 mL

## 2023-01-13 MED ORDER — DEXTROSE 5 % IV SOLN: INTRAVENOUS | Status: AC

## 2023-01-13 MED ORDER — THIAMINE HCL 100 MG/ML IJ SOLN
100.0000 mg | Freq: Every day | INTRAMUSCULAR | Status: DC
  Administered 2023-01-13: 100 mg via INTRAVENOUS
  Filled 2023-01-13 (×2): qty 2

## 2023-01-13 MED ORDER — CEFTRIAXONE SODIUM 2 G IJ SOLR
2.0000 g | INTRAMUSCULAR | Status: DC
  Administered 2023-01-13: 2 g via INTRAVENOUS
  Filled 2023-01-13: qty 20

## 2023-01-13 MED ORDER — HEPARIN SODIUM (PORCINE) 5000 UNIT/ML IJ SOLN
5000.0000 [IU] | Freq: Two times a day (BID) | INTRAMUSCULAR | Status: DC
  Administered 2023-01-13 – 2023-01-15 (×4): 5000 [IU] via SUBCUTANEOUS
  Filled 2023-01-13 (×4): qty 1

## 2023-01-13 MED ORDER — PIVOT 1.5 CAL PO LIQD
1000.0000 mL | ORAL | Status: DC
  Filled 2023-01-13: qty 1000

## 2023-01-13 MED ORDER — INSULIN ASPART 100 UNIT/ML IJ SOLN
0.0000 [IU] | Freq: Four times a day (QID) | INTRAMUSCULAR | Status: DC
  Administered 2023-01-13: 1 [IU] via SUBCUTANEOUS
  Administered 2023-01-13 – 2023-01-14 (×2): 2 [IU] via SUBCUTANEOUS
  Administered 2023-01-14: 1 [IU] via SUBCUTANEOUS
  Administered 2023-01-14: 2 [IU] via SUBCUTANEOUS
  Administered 2023-01-15 (×3): 1 [IU] via SUBCUTANEOUS

## 2023-01-13 NOTE — Procedures (Signed)
Cortrak  Person Inserting Tube:  [name] D, RD Tube Type:  Cortrak - 43 inches Tube Size:  10 Tube Location:  Right nare Secured by: Bridle Technique Used to Measure Tube Placement:  Marking at nare/corner of mouth Cortrak Secured At:  61 cm Procedure Comments:  Cortrak Tube Team Note:  Consult received to place a Cortrak feeding tube.   X-ray is required, abdominal x-ray has been ordered by the Cortrak team. Please confirm tube placement before using the Cortrak tube.   If the tube becomes dislodged please keep the tube and contact the Cortrak team at [email or web address] for replacement.  If after hours and replacement cannot be delayed, place a NG tube and confirm placement with an abdominal x-ray.    [name], RD, LDN Clinical Dietitian RD pager # available in [facility]  After hours/weekend pager # available in [facility]

## 2023-01-13 NOTE — Progress Notes (Signed)
eLink Physician-Brief Progress Note Patient Name: Amy [name] DOB: 1945/06/13 MRN: [id number]   Date of Service  01/13/2023  HPI/Events of Note  Na 161 Chloride back to > 130   LR at 75  eICU Interventions  LR increased back up to 150 cc/hr        Irving Burton T Oshea Percival 01/13/2023, 1:10 AM

## 2023-01-13 NOTE — Progress Notes (Signed)
Initial Nutrition Assessment  DOCUMENTATION CODES:   Severe malnutrition in context of chronic illness, Underweight  INTERVENTION:  Initiate trickle tube feeding via Cortrak: Osmolite 1.2 at 69ml/h  Once able to advance recommend: Advancing Osmolite 1.2 by 10ml q8h to a goal rate of 45 ml/h (1080 ml per day)  TF at goal would provide: 1296 kcal, 60 gm protein, 886 ml free water daily  Monitor magnesium and phosphorus every 12 hours x 4 occurrences, MD to replete as needed, as pt is at risk for refeeding syndrome.  Recommend thiamine 100mg  x5 days; reached out to MD  NUTRITION DIAGNOSIS:   Severe Malnutrition related to chronic illness (parkinsons, memory loss) as evidenced by severe fat depletion, severe muscle depletion, percent weight loss.  GOAL:   Patient will meet greater than or equal to 90% of their needs  MONITOR:   Labs, Weight trends, TF tolerance, Skin  REASON FOR ASSESSMENT:   Consult Enteral/tube feeding initiation and management  ASSESSMENT:   Pt admitted with sepsis and AKI; recently admitted 10/14-10/21 for hip fracture s/p repair and discharge to SNF where she has had increased lethargy, decreased po intake and interaction. PMH significant for hair loss, Hashimoto's thyroiditis, osteoporosis, Parkinson's disease.  11/4 s/p Cortrak placement  Pt remains NPO with Cortrak order in place. Called and briefly spoke with pt's husband via phone call as no family present at bedside during pt assessment. Pt's husband agrees to temporary nutrition support. He states that her intake has declined significantly since her last admission on 10/14.  Admit weight: 42.1 kg  Current weight: 42.6kg Review of weight history reflects pt to have had a weight loss of 14.6% within the last 9 months which is clinically significant for time frame.    Intake/Output Summary (Last 24 hours) at 01/13/2023 1600 Last data filed at 01/13/2023 1400 Gross per 24 hour  Intake 2625.51 ml   Output 950 ml  Net 1675.51 ml   Net IO Since Admission: 1,675.51 mL [01/13/23 1600]  Nutritionally Relevant Medications: Scheduled Meds:  Chlorhexidine Gluconate Cloth  6 each Topical Daily   heparin  5,000 Units Subcutaneous BID   insulin aspart  0-9 Units Subcutaneous Q6H   sodium chloride flush  3 mL Intravenous Q12H   thiamine (VITAMIN B1) injection  100 mg Intravenous Daily   Continuous Infusions:  cefTRIAXone (ROCEPHIN)  IV Stopped (01/13/23 1337)   dextrose 75 mL/hr at 01/13/23 1400   feeding supplement (OSMOLITE 1.2 CAL)     lactated ringers 75 mL/hr at 01/13/23 1400   metronidazole Stopped (01/13/23 0710)   norepinephrine (LEVOPHED) Adult infusion 1 mcg/min (01/13/23 1400)   PRN Meds:.docusate sodium, mouth rinse, polyethylene glycol  Labs Reviewed: Sodium 163 Cl >130 BUN 81 Cr 1.48 GFR 37 Lactic acid 2.2 CBG ranges from 118-160 mg/dL over the last 24 hours   NUTRITION - FOCUSED PHYSICAL EXAM:  Flowsheet Row Most Recent Value  Orbital Region Severe depletion  Upper Arm Region Severe depletion  Thoracic and Lumbar Region Severe depletion  Buccal Region Severe depletion  Temple Region Severe depletion  Clavicle Bone Region Severe depletion  Clavicle and Acromion Bone Region Severe depletion  Scapular Bone Region Severe depletion  Dorsal Hand Severe depletion  Patellar Region Severe depletion  Anterior Thigh Region Severe depletion  Posterior Calf Region Severe depletion  Edema (RD Assessment) None  Hair Reviewed  Eyes Unable to assess  Mouth Reviewed  Skin Reviewed  Nails Reviewed       Diet Order:   Diet  Order             Diet NPO time specified  Diet effective now                   EDUCATION NEEDS:   No education needs have been identified at this time  Skin:  Skin Assessment: Skin Integrity Issues: Skin Integrity Issues:: Stage I Stage I: L ischial tuberosity  Last BM:  11/3 (type 6 large; type 7 small)  Height:   Ht  Readings from Last 1 Encounters:  12/23/22 5\' 2"  (1.575 m)    Weight:   Wt Readings from Last 1 Encounters:  01/13/23 42.6 kg   BMI:  Body mass index is 17.18 kg/m.  Estimated Nutritional Needs:   Kcal:  1300-1500  Protein:  60-75g  Fluid:  1.3-1.5L  Drusilla Kanner, RDN, LDN Clinical Nutrition

## 2023-01-13 NOTE — Progress Notes (Addendum)
NAME:  Amy [name], MRN:  [id number], DOB:  Oct 05, 1945, LOS: 1 ADMISSION DATE:  01/12/2023, CONSULTATION DATE: 01/12/2023 REFERRING MD: Dr. Rubin Payor, CHIEF COMPLAINT: Sepsis, septic shock, hyponatremia  History of Present Illness:  77 year old woman admitted with AKI and severe hyponatremia with a history of decreased p.o. intake Recently had a hip fracture for which she had surgery discharged from the hospital to skilled nursing facility,  Concern for urinary tract infection recently, started on antibiotics Has not been eating or drinking much Increasing lethargy led to her being brought to the hospital  Pertinent  Medical History   Past Medical History:  Diagnosis Date   Dyspnea on exertion    Elevated blood sugar    Hair loss    Hashimoto's thyroiditis    Hypothyroidism    Impaired fasting blood sugar    Osteoporosis    Palpitations    Parkinson's disease (HCC)    Parkinsonism (HCC)    Restless leg syndrome 06/21/2019   Ventricular premature beats    Weakness    Significant Hospital Events: Including procedures, antibiotic start and stop dates in addition to other pertinent events   CT abdomen-air in the bladder, significant stool burden  Interim History / Subjective:   Remains critically ill, on low-dose Levophed. Urine output about 1 L Low-grade febrile  Objective   Blood pressure 100/67, pulse 94, temperature 100 F (37.8 C), resp. rate (!) 31, weight 42.6 kg, SpO2 97%.        Intake/Output Summary (Last 24 hours) at 01/13/2023 1102 Last data filed at 01/13/2023 1000 Gross per 24 hour  Intake 2112.34 ml  Output 950 ml  Net 1162.34 ml   Filed Weights   01/12/23 1857 01/13/23 0500  Weight: 42.1 kg 42.6 kg    Examination: General: Elderly, chronically ill-appearing HENT: Dry oral mucosa, flat neck veins Lungs: Decreased air movement bilaterally Cardiovascular: S1-S2 irregular daughter Abdomen: Soft, bowel sounds appreciated Extremities: No clubbing, no  edema Neuro: Lethargic, does not follow commands, no meningeal signs  Labs show persistent hyponatremia 163, BUN/creatinine improved to 81/1.5, persistent leukocytosis with left shift, hemoglobin dropped from 13.8-11  Resolved Hospital Problem list    Assessment & Plan:  Septic shock -May be secondary to urinary tract infection -Continue LR at 75 cc -Coming off Levophed slowly -Follow cultures  Acute kidney injury -Likely prerenal , improving -Continue fluid resuscitation -Renal dose medications  Hypernatremia -Monitor every 6 sodium, add hypotonic D5W at 75 -Avoid overcorrection  Concern for colovesical fistula -Will need surgical consultation  Severe protein calorie malnutrition Failure to thrive -Okay to place core track  Resume medications for Parkinson's when able to place cortrak  History of hypothyroidism -Synthroid when enteral access available  Palliative care input for goals of care conversation   Best Practice (right click and "Reselect all SmartList Selections" daily)   Diet/type: NPO DVT prophylaxis: prophylactic heparin  GI prophylaxis: N/A Lines: N/A Foley:  N/A Code Status:  limited Last date of multidisciplinary goals of care discussion family updated 11/4 - son & husband  - they do not desire invasive procedures, she is not a candidate for fistula repair should this be a colovesical fistula , so will not pursue further workup, they are ok with short term feeding tube Labs   CBC: Recent Labs  Lab 01/12/23 1422 01/12/23 1740 01/13/23 0712  WBC 19.5* 25.3* 20.6*  NEUTROABS 16.6* 21.7*  --   HGB 10.8* 13.8 11.0*  HCT 38.5 48.0* 36.1  MCV 102.1* 98.0 94.5  PLT 260 315 268    Basic Metabolic Panel: Recent Labs  Lab 01/12/23 1740 01/12/23 2022 01/13/23 0012 01/13/23 0712 01/13/23 0914  NA 163* 160* 161* 161* 163*  K 4.9 4.0 4.0 3.6 3.7  CL >130* 127* >130* >130* >130*  CO2 13* 20* 19* 19* 19*  GLUCOSE 158* 220* 177* 123* 126*  BUN  140* 121* 108* 89* 81*  CREATININE 3.32* 2.81* 2.22* 1.66* 1.48*  CALCIUM 8.0* 7.8* 7.9* 7.9* 7.9*   GFR: Estimated Creatinine Clearance: 22.1 mL/min (A) (by C-G formula based on SCr of 1.48 mg/dL (H)). Recent Labs  Lab 01/12/23 1422 01/12/23 1425 01/12/23 1740 01/12/23 2022 01/13/23 0712  WBC 19.5*  --  25.3*  --  20.6*  LATICACIDVEN  --  3.5*  --  2.2*  --     Liver Function Tests: Recent Labs  Lab 01/12/23 1556 01/12/23 1740  AST 58* 60*  ALT 21 24  ALKPHOS 174* 200*  BILITOT 1.6* 1.5*  PROT 6.3* 6.2*  ALBUMIN 2.7* 2.8*   No results for input(s): "LIPASE", "AMYLASE" in the last 168 hours. No results for input(s): "AMMONIA" in the last 168 hours.  ABG No results found for: "PHART", "PCO2ART", "PO2ART", "HCO3", "TCO2", "ACIDBASEDEF", "O2SAT"   Coagulation Profile: Recent Labs  Lab 01/12/23 1422  INR 1.9*    Cardiac Enzymes: No results for input(s): "CKTOTAL", "CKMB", "CKMBINDEX", "TROPONINI" in the last 168 hours.  HbA1C: No results found for: "HGBA1C"  CBG: Recent Labs  Lab 01/12/23 1939 01/12/23 2320 01/13/23 0336 01/13/23 0802  GLUCAP 160* 154* 134* 120*    The patient is critically ill with multiple organ systems failure and requires high complexity decision making for assessment and support, frequent evaluation and titration of therapies, application of advanced monitoring technologies and extensive interpretation of multiple databases. Critical Care Time devoted to patient care services described in this note independent of APP/resident time (if applicable)  is 32 minutes.   Cyril Mourning MD. Tonny Bollman. [facility] Pager : [phone]  If no response to pager , please call [phone] until 7 pm After 7:00 pm call Elink  [phone]   01/13/2023

## 2023-01-13 NOTE — Progress Notes (Signed)
eLink Physician-Brief Progress Note Patient Name: Amy [name] DOB: 1945-09-18 MRN: [id number]   Date of Service  01/13/2023  HPI/Events of Note  77 year old initially presented from skilled nursing for septic shock thought to be secondary to UTI complicated by encephalopathy.  Recently had hip surgery and appears to be in pain.  Was prescribed oxycodone at home-taking 2.5 mg rather than the prescribed 5 mg.  eICU Interventions  Will resume oxycodone sliding scale     Intervention Category Intermediate Interventions: Pain - evaluation and management  Evaan Tidwell 01/13/2023, 10:53 PM

## 2023-01-14 DIAGNOSIS — Z7189 Other specified counseling: Secondary | ICD-10-CM | POA: Diagnosis not present

## 2023-01-14 DIAGNOSIS — R6521 Severe sepsis with septic shock: Secondary | ICD-10-CM | POA: Diagnosis not present

## 2023-01-14 DIAGNOSIS — A419 Sepsis, unspecified organism: Secondary | ICD-10-CM | POA: Diagnosis not present

## 2023-01-14 DIAGNOSIS — G9341 Metabolic encephalopathy: Secondary | ICD-10-CM

## 2023-01-14 DIAGNOSIS — Z515 Encounter for palliative care: Secondary | ICD-10-CM | POA: Diagnosis not present

## 2023-01-14 DIAGNOSIS — N179 Acute kidney failure, unspecified: Secondary | ICD-10-CM | POA: Diagnosis not present

## 2023-01-14 LAB — CBC WITH DIFFERENTIAL/PLATELET
Abs Immature Granulocytes: 0.09 10*3/uL — ABNORMAL HIGH (ref 0.00–0.07)
Basophils Absolute: 0 10*3/uL (ref 0.0–0.1)
Basophils Relative: 0 %
Eosinophils Absolute: 0.5 10*3/uL (ref 0.0–0.5)
Eosinophils Relative: 3 %
HCT: 34.1 % — ABNORMAL LOW (ref 36.0–46.0)
Hemoglobin: 10.3 g/dL — ABNORMAL LOW (ref 12.0–15.0)
Immature Granulocytes: 1 %
Lymphocytes Relative: 11 %
Lymphs Abs: 1.8 10*3/uL (ref 0.7–4.0)
MCH: 28.4 pg (ref 26.0–34.0)
MCHC: 30.2 g/dL (ref 30.0–36.0)
MCV: 93.9 fL (ref 80.0–100.0)
Monocytes Absolute: 0.6 10*3/uL (ref 0.1–1.0)
Monocytes Relative: 4 %
Neutro Abs: 13.8 10*3/uL — ABNORMAL HIGH (ref 1.7–7.7)
Neutrophils Relative %: 81 %
Platelets: 221 10*3/uL (ref 150–400)
RBC: 3.63 MIL/uL — ABNORMAL LOW (ref 3.87–5.11)
RDW: 15.9 % — ABNORMAL HIGH (ref 11.5–15.5)
WBC: 16.7 10*3/uL — ABNORMAL HIGH (ref 4.0–10.5)
nRBC: 0 % (ref 0.0–0.2)

## 2023-01-14 LAB — BLOOD CULTURE ID PANEL (REFLEXED) - BCID2

## 2023-01-14 LAB — BASIC METABOLIC PANEL
Anion gap: 7 (ref 5–15)
Anion gap: 10 (ref 5–15)
BUN: 39 mg/dL — ABNORMAL HIGH (ref 8–23)
BUN: 29 mg/dL — ABNORMAL HIGH (ref 8–23)
CO2: 20 mmol/L — ABNORMAL LOW (ref 22–32)
CO2: 20 mmol/L — ABNORMAL LOW (ref 22–32)
Calcium: 7.4 mg/dL — ABNORMAL LOW (ref 8.9–10.3)
Calcium: 7.3 mg/dL — ABNORMAL LOW (ref 8.9–10.3)
Chloride: 122 mmol/L — ABNORMAL HIGH (ref 98–111)
Chloride: 114 mmol/L — ABNORMAL HIGH (ref 98–111)
Creatinine, Ser: 0.89 mg/dL (ref 0.44–1.00)
Creatinine, Ser: 0.73 mg/dL (ref 0.44–1.00)
GFR, Estimated: >60 mL/min (ref >60)
GFR, Estimated: >60 mL/min (ref >60)
Glucose, Bld: 214 mg/dL — ABNORMAL HIGH (ref 70–99)
Glucose, Bld: 204 mg/dL — ABNORMAL HIGH (ref 70–99)
Potassium: 3.6 mmol/L (ref 3.5–5.1)
Potassium: 3.8 mmol/L (ref 3.5–5.1)
Sodium: 149 mmol/L — ABNORMAL HIGH (ref 135–145)
Sodium: 144 mmol/L (ref 135–145)

## 2023-01-14 LAB — PHOSPHORUS
Phosphorus: 1.7 mg/dL — ABNORMAL LOW (ref 2.5–4.6)
Phosphorus: 2.8 mg/dL (ref 2.5–4.6)

## 2023-01-14 LAB — HEMOGLOBIN A1C
Hgb A1c MFr Bld: 5.7 % — ABNORMAL HIGH (ref 4.8–5.6)
Mean Plasma Glucose: 117 mg/dL

## 2023-01-14 LAB — GLUCOSE, CAPILLARY
Glucose-Capillary: 110 mg/dL — ABNORMAL HIGH (ref 70–99)
Glucose-Capillary: 122 mg/dL — ABNORMAL HIGH (ref 70–99)
Glucose-Capillary: 133 mg/dL — ABNORMAL HIGH (ref 70–99)
Glucose-Capillary: 152 mg/dL — ABNORMAL HIGH (ref 70–99)
Glucose-Capillary: 178 mg/dL — ABNORMAL HIGH (ref 70–99)
Glucose-Capillary: 196 mg/dL — ABNORMAL HIGH (ref 70–99)
Glucose-Capillary: 200 mg/dL — ABNORMAL HIGH (ref 70–99)

## 2023-01-14 LAB — MAGNESIUM
Magnesium: 2.2 mg/dL (ref 1.7–2.4)
Magnesium: 1.9 mg/dL (ref 1.7–2.4)
Magnesium: 1.7 mg/dL (ref 1.7–2.4)

## 2023-01-14 MED ORDER — LEVOTHYROXINE SODIUM 75 MCG PO TABS
75.0000 ug | ORAL_TABLET | Freq: Every day | ORAL | Status: DC
  Administered 2023-01-15: 75 ug
  Filled 2023-01-14: qty 3

## 2023-01-14 MED ORDER — CARBIDOPA-LEVODOPA 25-100 MG PO TABS
1.0000 | ORAL_TABLET | Freq: Four times a day (QID) | ORAL | Status: DC
Start: 2023-01-14 — End: 2023-01-15
  Administered 2023-01-14 – 2023-01-15 (×6): 1
  Filled 2023-01-14 (×8): qty 1

## 2023-01-14 MED ORDER — MIDODRINE HCL 5 MG PO TABS
10.0000 mg | ORAL_TABLET | Freq: Three times a day (TID) | ORAL | Status: DC
  Administered 2023-01-14 – 2023-01-15 (×3): 10 mg
  Filled 2023-01-14 (×3): qty 2

## 2023-01-14 MED ORDER — PRAMIPEXOLE DIHYDROCHLORIDE 0.25 MG PO TABS
1.0000 mg | ORAL_TABLET | Freq: Three times a day (TID) | ORAL | Status: DC
  Administered 2023-01-14 – 2023-01-15 (×4): 1 mg
  Filled 2023-01-14 (×7): qty 1

## 2023-01-14 MED ORDER — POTASSIUM & SODIUM PHOSPHATES 280-160-250 MG PO PACK
2.0000 | PACK | Freq: Three times a day (TID) | ORAL | Status: AC
  Administered 2023-01-14 (×3): 2
  Filled 2023-01-14 (×3): qty 2

## 2023-01-14 MED ORDER — LACTATED RINGERS BOLUS PEDS
500.0000 mL | Freq: Once | INTRAVENOUS | Status: AC
  Administered 2023-01-14: 500 mL via INTRAVENOUS

## 2023-01-14 MED ORDER — METRONIDAZOLE 500 MG PO TABS
500.0000 mg | ORAL_TABLET | Freq: Two times a day (BID) | ORAL | Status: DC
  Administered 2023-01-14 – 2023-01-15 (×3): 500 mg
  Filled 2023-01-14 (×3): qty 1

## 2023-01-14 MED ORDER — MIDODRINE HCL 5 MG PO TABS
5.0000 mg | ORAL_TABLET | Freq: Three times a day (TID) | ORAL | Status: DC
  Administered 2023-01-14: 5 mg
  Filled 2023-01-14: qty 1

## 2023-01-14 MED ORDER — SODIUM CHLORIDE 0.9 % IV SOLN
2.0000 g | INTRAVENOUS | Status: DC
  Administered 2023-01-14 – 2023-01-15 (×2): 2 g via INTRAVENOUS
  Filled 2023-01-14 (×2): qty 20

## 2023-01-14 MED ORDER — THIAMINE MONONITRATE 100 MG PO TABS
100.0000 mg | ORAL_TABLET | Freq: Every day | ORAL | Status: DC
  Administered 2023-01-14 – 2023-01-15 (×2): 100 mg
  Filled 2023-01-14 (×3): qty 1

## 2023-01-14 MED ORDER — POTASSIUM PHOSPHATES 15 MMOLE/5ML IV SOLN
30.0000 mmol | Freq: Once | INTRAVENOUS | Status: DC
  Administered 2023-01-14: 30 mmol via INTRAVENOUS
  Filled 2023-01-14: qty 10

## 2023-01-14 MED ORDER — FENTANYL CITRATE PF 50 MCG/ML IJ SOSY
12.5000 ug | PREFILLED_SYRINGE | INTRAMUSCULAR | Status: DC | PRN
  Administered 2023-01-14: 12.5 ug via INTRAVENOUS
  Administered 2023-01-15: 25 ug via INTRAVENOUS
  Administered 2023-01-15: 12.5 ug via INTRAVENOUS
  Administered 2023-01-15: 25 ug via INTRAVENOUS
  Filled 2023-01-14 (×4): qty 1

## 2023-01-14 MED ORDER — NOREPINEPHRINE 4 MG/250ML-% IV SOLN: 0.0000 ug/min | INTRAVENOUS | Status: DC

## 2023-01-14 MED ORDER — LACTATED RINGERS IV BOLUS
400.0000 mL | Freq: Once | INTRAVENOUS | Status: AC
  Administered 2023-01-14: 400 mL via INTRAVENOUS

## 2023-01-14 NOTE — Plan of Care (Signed)
  Problem: Clinical Measurements: Goal: Diagnostic test results will improve Outcome: Progressing Goal: Respiratory complications will improve Outcome: Progressing Goal: Cardiovascular complication will be avoided Outcome: Progressing   Problem: Nutrition: Goal: Adequate nutrition will be maintained Outcome: Progressing   Problem: Pain Management: Goal: General experience of comfort will improve Outcome: Progressing   Problem: Safety: Goal: Ability to remain free from injury will improve Outcome: Progressing   Problem: Activity: Goal: Risk for activity intolerance will decrease Outcome: Not Progressing   Problem: Elimination: Goal: Will not experience complications related to bowel motility Outcome: Not Progressing

## 2023-01-14 NOTE — Progress Notes (Signed)
[facility] ADULT ICU REPLACEMENT PROTOCOL   The patient does apply for the [facility] Adult ICU Electrolyte Replacment Protocol based on the criteria listed below:   1.Exclusion criteria: TCTS, ECMO, Dialysis, and Myasthenia Gravis patients 2. Is GFR >/= 30 ml/min? Yes.    Patient's GFR today is >60 3. Is SCr </= 2? Yes.   Patient's SCr is 0.89 mg/dL 4. Did SCr increase >/= 0.5 in 24 hours? No. 5.Pt's weight >40kg  Yes.   6. Abnormal electrolyte(s): Phos 1.7, K+ 3.6  7. Electrolytes replaced per protocol 8.  Call MD STAT for K+ </= 2.5, Phos </= 1, or Mag </= 1 Physician:  Dr. Loralyn Freshwater, Lilia Argue 01/14/2023 5:27 AM

## 2023-01-14 NOTE — Progress Notes (Addendum)
NAME:  Amy [name], MRN:  [id number], DOB:  1945/10/25, LOS: 2 ADMISSION DATE:  01/12/2023, CONSULTATION DATE:  01/12/2023 REFERRING MD:  Rubin Payor, EDP CHIEF COMPLAINT:  Septic shock, hyponatremia   History of Present Illness:  77 year old woman admitted with AKI and severe hyponatremia with a history of decreased p.o. intake Recently had a hip fracture for which she had surgery discharged from the hospital to skilled nursing facility,  Concern for urinary tract infection recently, started on antibiotics Has not been eating or drinking much Increasing lethargy led to her being brought to the hospital  Pertinent  Medical History   Past Medical History:  Diagnosis Date   Dyspnea on exertion    Elevated blood sugar    Hair loss    Hashimoto's thyroiditis    Hypothyroidism    Impaired fasting blood sugar    Osteoporosis    Palpitations    Parkinson's disease (HCC)    Parkinsonism (HCC)    Restless leg syndrome 06/21/2019   Ventricular premature beats    Weakness      Significant Hospital Events: Including procedures, antibiotic start and stop dates in addition to other pertinent events   11/3 admitted to ICU, air seen in bladder on CT, vasopressor needs  Interim History / Subjective:  Not responding to stimuli.   Objective   Blood pressure 101/63, pulse 94, temperature 98.4 F (36.9 C), resp. rate (!) 22, weight 42.9 kg, SpO2 98%.        Intake/Output Summary (Last 24 hours) at 01/14/2023 0637 Last data filed at 01/14/2023 0541 Gross per 24 hour  Intake 2641.74 ml  Output 2475 ml  Net 166.74 ml   Filed Weights   01/12/23 1857 01/13/23 0500 01/14/23 0500  Weight: 42.1 kg 42.6 kg 42.9 kg    Examination: Constitutional:ill appearing elderly female.  Cardio:Regular rate and rhythm. Pulm: Normal work of breathing on room air. Abdomen: Soft, non-distended, positive bowel sounds. EPP:IRJJOACZ for extremity edema. Skin:Warm and dry. Neuro:not responding, withdraws to  pain  Resolved Hospital Problem list     Assessment & Plan:  Septic shock-possibly 2/2 UTI, urine culture positive for Proteus mirabilis and E. coli AKI-improved Hypernatremia-163 peak and down to 149 after hypotonic fluids Concern for colovesicular fistula- seen on CT, not a surgical candidate  Severe protein calorie malnutrition and failure to thrive Parkinson's disease Hypothyroidism  Plan Wean Levophed and for MAP goal of 65 Start midodrine 5 mg TID Continue ceftriaxone and flagyl for 7 days DC LR fluids with core track placed Continue d5W at 75 mL/hr Continue every 6 hours sodium checks Tube feeds and resume home meds per tube  If stable off vasopressors then she is stable for med surg transfer.   Best Practice (right click and "Reselect all SmartList Selections" daily)   Diet/type: tubefeeds DVT prophylaxis: prophylactic heparin  GI prophylaxis: N/A Lines: N/A Foley:  N/A Code Status:  DNR Last date of multidisciplinary goals of care discussion [01/14/2023]  Labs   CBC: Recent Labs  Lab 01/12/23 1422 01/12/23 1740 01/13/23 0712 01/14/23 0340  WBC 19.5* 25.3* 20.6* 16.7*  NEUTROABS 16.6* 21.7*  --  13.8*  HGB 10.8* 13.8 11.0* 10.3*  HCT 38.5 48.0* 36.1 34.1*  MCV 102.1* 98.0 94.5 93.9  PLT 260 315 268 221    Basic Metabolic Panel: Recent Labs  Lab 01/13/23 0712 01/13/23 0914 01/13/23 1520 01/13/23 2125 01/14/23 0340  NA 161* 163* 163* 156* 149*  K 3.6 3.7 3.5 3.3* 3.6  CL >130* >130*  130* 126* 122*  CO2 19* 19* 21* 22 20*  GLUCOSE 123* 126* 140* 192* 214*  BUN 89* 81* 69* 61* 39*  CREATININE 1.66* 1.48* 1.24* 1.05* 0.89  CALCIUM 7.9* 7.9* 7.8* 7.8* 7.4*  MG  --   --   --   --  2.2  PHOS  --   --   --   --  1.7*   GFR: Estimated Creatinine Clearance: 37 mL/min (by C-G formula based on SCr of 0.89 mg/dL). Recent Labs  Lab 01/12/23 1422 01/12/23 1425 01/12/23 1740 01/12/23 2022 01/13/23 0712 01/14/23 0340  WBC 19.5*  --  25.3*  --   20.6* 16.7*  LATICACIDVEN  --  3.5*  --  2.2*  --   --     Liver Function Tests: Recent Labs  Lab 01/12/23 1556 01/12/23 1740  AST 58* 60*  ALT 21 24  ALKPHOS 174* 200*  BILITOT 1.6* 1.5*  PROT 6.3* 6.2*  ALBUMIN 2.7* 2.8*   No results for input(s): "LIPASE", "AMYLASE" in the last 168 hours. No results for input(s): "AMMONIA" in the last 168 hours.  ABG No results found for: "PHART", "PCO2ART", "PO2ART", "HCO3", "TCO2", "ACIDBASEDEF", "O2SAT"   Coagulation Profile: Recent Labs  Lab 01/12/23 1422  INR 1.9*    Cardiac Enzymes: No results for input(s): "CKTOTAL", "CKMB", "CKMBINDEX", "TROPONINI" in the last 168 hours.  HbA1C: Hgb A1c MFr Bld  Date/Time Value Ref Range Status  01/12/2023 08:22 PM 5.7 (H) 4.8 - 5.6 % Final    Comment:    (NOTE)         Prediabetes: 5.7 - 6.4         Diabetes: >6.4         Glycemic control for adults with diabetes: <7.0     CBG: Recent Labs  Lab 01/13/23 1549 01/13/23 1932 01/13/23 2320 01/14/23 0318 01/14/23 0535  GLUCAP 123* 138* 193* 196* 178*    Review of Systems:   Unable to obtain  Past Medical History:  She,  has a past medical history of Dyspnea on exertion, Elevated blood sugar, Hair loss, Hashimoto's thyroiditis, Hypothyroidism, Impaired fasting blood sugar, Osteoporosis, Palpitations, Parkinson's disease (HCC), Parkinsonism (HCC), Restless leg syndrome (06/21/2019), Ventricular premature beats, and Weakness.   Surgical History:   Past Surgical History:  Procedure Laterality Date   CATARACT EXTRACTION Bilateral    CESAREAN SECTION     x2   CHOLECYSTECTOMY     FOOT SURGERY Right 01/15/2016   INTRAMEDULLARY (IM) NAIL INTERTROCHANTERIC Right 12/24/2022   Procedure: INTRAMEDULLARY (IM) NAIL INTERTROCHANTERIC;  Surgeon: Luci Bank, MD;  Location: [facility] OR;  Service: Orthopedics;  Laterality: Right;   WRIST SURGERY  1999     Social History:   reports that she has never smoked. She has never used smokeless  tobacco. She reports that she does not drink alcohol and does not use drugs.   Family History:  Her family history includes Breast cancer in her mother; Lung disease in her brother. There is no history of Parkinson's disease.   Allergies Allergies  Allergen Reactions   Ciprofloxacin Rash     Home Medications  Prior to Admission medications   Medication Sig Start Date End Date Taking? Authorizing Provider  acetaminophen (TYLENOL) 325 MG tablet Take 650 mg by mouth every 6 (six) hours as needed for moderate pain (pain score 4-6).   Yes [provider]  amantadine (SYMMETREL) 100 MG capsule TAKE 1 CAPSULE BY MOUTH EVERY MORNING, AT NOON & AT BEDTIME  Patient taking differently: Take 100 mg by mouth 3 (three) times daily. 07/11/22  Yes Ihor Austin, NP  aspirin EC 81 MG tablet Take 1 tablet (81 mg total) by mouth in the morning and at bedtime. Take after 12 weeks for prevention of preeclampsia later in pregnancy 12/30/22 01/29/23 Yes Wouk, Wilfred Curtis, MD  bisacodyl (FLEET) 10 MG/30ML ENEM Place 10 mg rectally once. Insert 1 suppository rectally every 24 hours as needed if no relief from MOM-constipation.   Yes [provider]  carbidopa-levodopa (SINEMET IR) 25-100 MG tablet TAKE 1 TABLET BY MOUTH 8 TIMES DAILY-6 AM, 8 AM, 10 AM, 12 PM, 2 PM, 4 PM, 6 PM AND 8 PM Patient taking differently: Take 1 tablet by mouth See admin instructions. Take 1 tablet by mouth every other hour or 1.5 hours 10/21/22  Yes Huston Foley, MD  magnesium hydroxide (MILK OF MAGNESIA) 400 MG/5ML suspension Take 30 mLs by mouth daily as needed for mild constipation or moderate constipation.   Yes [provider]  nitrofurantoin, macrocrystal-monohydrate, (MACROBID) 100 MG capsule Take 100 mg by mouth 2 (two) times daily. For 5 days for UTI   Yes [provider]  oxyCODONE (OXY IR/ROXICODONE) 5 MG immediate release tablet Take 1 tablet (5 mg total) by mouth every 6 (six) hours as needed for  breakthrough pain ((for MODERATE breakthrough pain)). Patient taking differently: Take 2.5 mg by mouth every 8 (eight) hours as needed for breakthrough pain ((for MODERATE breakthrough pain)). 12/30/22  Yes Wouk, Wilfred Curtis, MD  pramipexole (MIRAPEX) 1 MG tablet Take 1 tablet (1 mg total) by mouth 3 (three) times daily. 05/13/22  Yes McCue, Shanda Bumps, NP  saccharomyces boulardii (FLORASTOR) 250 MG capsule Take 250 mg by mouth 2 (two) times daily.   Yes [provider]  sodium chloride 0.45 % Inject 50 mLs into the vein once. Prophylaxis until 01/13/2023 23:59. 37ml/hr   Yes [provider]  Sodium Phosphates (ENEMA RE) Place 1 Application rectally daily as needed.   Yes [provider]  SYNTHROID 75 MCG tablet Take 75 mcg by mouth daily before breakfast. 11/16/18  Yes [provider]     Critical care time: 31    Rocky Morel, DO Internal Medicine Resident, PGY-2 Pager# [phone] [facility] 01/14/2023 6:37 AM  For contact information, see Amion. If no response to pager, please call PCCM consult pager. After hours, 7PM- 7AM, please call Elink.

## 2023-01-14 NOTE — Progress Notes (Signed)
eLink Physician-Brief Progress Note Patient Name: Rexanna Louthan DOB: 1946/02/13 MRN: [id number]   Date of Service  01/14/2023  HPI/Events of Note  Still on low-dose norepinephrine.  Order expired.  eICU Interventions  Renew norepinephrine order     Intervention Category Minor Interventions: Routine modifications to care plan (e.g. PRN medications for pain, fever)  Amala Petion 01/14/2023, 9:56 PM

## 2023-01-14 NOTE — Consult Note (Cosign Needed Addendum)
Palliative Medicine Inpatient Consult Note  Consulting Provider:  Oretha Milch, MD   Reason for consult:   Palliative Care Consult Services Palliative Medicine Consult  Reason for Consult? goals of care   01/14/2023  HPI:  Per intake H&P --> 77 year old woman admitted with AKI and severe hyponatremia with a history of decreased p.o. intake. Recently had a hip fracture for which she had surgery discharged from the hospital to skilled nursing facility.  Palliative care has been asked to assist with additional goals of care conversations.   Clinical Assessment/Goals of Care:  *Please note that this is a verbal dictation therefore any spelling or grammatical errors are due to the "Dragon Medical One" system interpretation.  I have reviewed medical records including EPIC notes, labs and imaging, received report from bedside RN, assessed the patient who opens her eyes though is not responsive.    I called patient's husband, Gerlene Burdock and [name] to further discuss diagnosis prognosis, GOC, EOL wishes, disposition and options.   I introduced Palliative Medicine as specialized medical care for people living with serious illness. It focuses on providing relief from the symptoms and stress of a serious illness. The goal is to improve quality of life for both the patient and the family.  Medical History Review and Understanding:  Patient's past medical history is significant for osteoporosis, Parkinson's disease, restless leg syndrome, right hip fracture, and adult failure to thrive.  Social History:  Marine is originally from [place].  She has been married to her husband, Gerlene Burdock for the past 49 years.  They share 2 sons.  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  She used to enjoy mystery novels.  She is a woman of faith and practices within Catholicism.  Functional and Nutritional State:  Preceding hip fracture she had a degree of independence and functionality.  For the last few weeks  though she has been generally declining.  She has had little to no appetite.  Advance Directives:  A detailed discussion was had today regarding advanced directives.  Marine Runner, broadcasting/film/video is her husband, Richard.  Code Status:  Concepts specific to code status, artifical feeding and hydration, continued IV antibiotics and rehospitalization was had.  The difference between a aggressive medical intervention path  and a palliative comfort care path for this patient at this time was had.   Marine is an established DO NOT RESUSCITATE DO NOT INTUBATE CODE STATUS  Discussion:  We discussed mornings declining health state over the past 2 weeks.  We reviewed the active infectious processes-urinary tract infection which are afflicting her.  We discussed patient's hyponatremia.  We reviewed that patient overall appears very frail and poorly functioning.  [name] vocalized understanding and shared that they too are worried about the short-term and long-term.  Discussed the concern for Coley vesicular fistula and family understands that patient would not be a good surgical candidate as she is so immensely frail.  Discussed openly and honestly decisions moving into the future-comfort care versus continued treatment.  Patient family plan to speak amongst themselves and meet with the palliative care team at noon  Discussed the importance of continued conversation with family and their  medical providers regarding overall plan of care and treatment options, ensuring decisions are within the context of the patients values and GOCs. _________________________  Addendum:  I met with patients spouse, Gerlene Burdock and son, [name]. I shared the short and long term concerns in the setting of patients present condition. Patients family are having  a tough time digesting how quickly she has declined and would like more time to consider options. In the meanwhile they are in agreement with optimizing pain  management.  At this time allowing time for outcomes.  Decision Maker: BURNA, ATLAS (Spouse): [phone] (Mobile)   SUMMARY OF RECOMMENDATIONS   DNAR/DNI  Open and honest conversation held in the setting of patients declined health state and present illness(s)  Allowing time for outcomes  PMT will continue to provide support  Code Status/Advance Care Planning: DNAR/DNI  Palliative Prophylaxis:  Aspiration, Bowel Regimen, Delirium Protocol, Frequent Pain Assessment, Oral Care, Palliative Wound Care, and Turn Reposition  Additional Recommendations (Limitations, Scope, Preferences): Continue current care  Psycho-social/Spiritual:  Desire for further Chaplaincy support: Yes Additional Recommendations: Education on poor outcomes associated post hip fracture - increase mortality   Prognosis: Exceptionally poor overall.   Discharge Planning: Discharge plan is uncertain at this time.   Vitals:   01/14/23 0600 01/14/23 0700  BP: 103/67 (!) 93/58  Pulse: 92 94  Resp: (!) 23 (!) 25  Temp: 97.7 F (36.5 C) (!) 97.5 F (36.4 C)  SpO2: 99% 98%    Intake/Output Summary (Last 24 hours) at 01/14/2023 5621 Last data filed at 01/14/2023 0700 Gross per 24 hour  Intake 3609.2 ml  Output 2475 ml  Net 1134.2 ml   Last Weight  Most recent update: 01/14/2023  5:47 AM    Weight  42.9 kg (94 lb 9.2 oz)            Gen:  Frail elderly acutely ill appearing F HEENT: Coretrack in place, dry mucous membranes CV: Regular rate and rhythm  PULM: On RA, breathing is even and nonlabored  ABD: soft/nontender  EXT: (+) contractures of BUE, onychomycosis of nails Neuro: Opens eyes though not responsive  PPS: 10%   This conversation/these recommendations were discussed with patient primary care team, Dr. Vassie Loll  Billing based on MDM: High  Problems Addressed: One acute or chronic illness or injury that poses a threat to life or bodily function  Amount and/or Complexity of Data:  Category 3:Discussion of management or test interpretation with external physician/other qualified health care professional/appropriate source (not separately reported)  Risks: Decision regarding elective major surgery with identified patient or procedure risk factors and Decision regarding hospitalization or escalation of hospital care ______________________________________________________ Lamarr Lulas [facility] Palliative Medicine Team Team Cell Phone: [phone] Please utilize secure chat with additional questions, if there is no response within 30 minutes please call the above phone number  Palliative Medicine Team providers are available by phone from 7am to 7pm daily and can be reached through the team cell phone.  Should this patient require assistance outside of these hours, please call the patient's attending physician.

## 2023-01-14 NOTE — Progress Notes (Signed)
I personally saw the patient and performed a substantive portion of this encounter, including a complete performance of at least one of the key components (MDM, Hx and/or Exam), in conjunction with the resident   AKI, resolving Severe hypernatremia Failure to thrive UTI, possible colovesical fistula  After enteral access was obtained yesterday, we started her on free water.  Fluids were adjusted.  She was writhing in pain and oral oxycodone was started.  Trickle tube feeds have been initiated She has eyes open this morning but does not follow commands, cogwheel rigidity+, mucosa appears moist.  Labs show lower sodium 149, hypokalemia has improved to 3.6, mild hypophosphatemia CBGs rising to 200, mild leukocytosis, BUN/creatinine normalized to 39/0.9  Impression/plan Septic shock -resolving can DC Levophed Hypernatremia improved, decrease D5W to 50 cc and complete another liter, continue free water Resume medications for Parkinson's, Synthroid and low-dose oxycodone for pain. Discussed with family in detail 11/4, we have treated hypernatremia and sepsis -if she continues to not thrive then family would be agreeable to hospice Palliative care is involved now  Transfer out of ICU if she remains off Levophed Independent critical care time was 31 minutes  Jacci Ruberg V. Vassie Loll MD

## 2023-01-14 NOTE — Progress Notes (Signed)
PHARMACY - PHYSICIAN COMMUNICATION CRITICAL VALUE ALERT - BLOOD CULTURE IDENTIFICATION (BCID)  Amy [name] is an 77 y.o. female who presented to [facility] on 01/12/2023 with a chief complaint of sepsis.   Assessment: 1/4 bcx MRSE  Name of physician (or Provider) ContactedVassie Loll  Current antibiotics: ceftriaxone 2g IV daily + metronidazole 500 PO BID  Changes to prescribed antibiotics recommended:  Patient is on recommended antibiotics - No changes needed. Blood culture is likely a contaminant.  Results for orders placed or performed during the hospital encounter of 01/12/23  Blood Culture ID Panel (Reflexed) (Collected: 01/12/2023  2:27 PM)  Result Value Ref Range   Enterococcus faecalis NOT DETECTED NOT DETECTED   Enterococcus Faecium NOT DETECTED NOT DETECTED   Listeria monocytogenes NOT DETECTED NOT DETECTED   Staphylococcus species DETECTED (A) NOT DETECTED   Staphylococcus aureus (BCID) NOT DETECTED NOT DETECTED   Staphylococcus epidermidis DETECTED (A) NOT DETECTED   Staphylococcus lugdunensis NOT DETECTED NOT DETECTED   Streptococcus species NOT DETECTED NOT DETECTED   Streptococcus agalactiae NOT DETECTED NOT DETECTED   Streptococcus pneumoniae NOT DETECTED NOT DETECTED   Streptococcus pyogenes NOT DETECTED NOT DETECTED   A.calcoaceticus-baumannii NOT DETECTED NOT DETECTED   Bacteroides fragilis NOT DETECTED NOT DETECTED   Enterobacterales NOT DETECTED NOT DETECTED   Enterobacter cloacae complex NOT DETECTED NOT DETECTED   Escherichia coli NOT DETECTED NOT DETECTED   Klebsiella aerogenes NOT DETECTED NOT DETECTED   Klebsiella oxytoca NOT DETECTED NOT DETECTED   Klebsiella pneumoniae NOT DETECTED NOT DETECTED   Proteus species NOT DETECTED NOT DETECTED   Salmonella species NOT DETECTED NOT DETECTED   Serratia marcescens NOT DETECTED NOT DETECTED   Haemophilus influenzae NOT DETECTED NOT DETECTED   Neisseria meningitidis NOT DETECTED NOT DETECTED   Pseudomonas  aeruginosa NOT DETECTED NOT DETECTED   Stenotrophomonas maltophilia NOT DETECTED NOT DETECTED   Candida albicans NOT DETECTED NOT DETECTED   Candida auris NOT DETECTED NOT DETECTED   Candida glabrata NOT DETECTED NOT DETECTED   Candida krusei NOT DETECTED NOT DETECTED   Candida parapsilosis NOT DETECTED NOT DETECTED   Candida tropicalis NOT DETECTED NOT DETECTED   Cryptococcus neoformans/gattii NOT DETECTED NOT DETECTED   Methicillin resistance mecA/C DETECTED (A) NOT DETECTED    Alesia Banda 01/14/2023  9:58 AM

## 2023-01-15 DIAGNOSIS — E870 Hyperosmolality and hypernatremia: Secondary | ICD-10-CM | POA: Diagnosis not present

## 2023-01-15 DIAGNOSIS — Z7189 Other specified counseling: Secondary | ICD-10-CM | POA: Diagnosis not present

## 2023-01-15 DIAGNOSIS — A419 Sepsis, unspecified organism: Secondary | ICD-10-CM | POA: Diagnosis not present

## 2023-01-15 DIAGNOSIS — N179 Acute kidney failure, unspecified: Secondary | ICD-10-CM | POA: Diagnosis not present

## 2023-01-15 DIAGNOSIS — R6521 Severe sepsis with septic shock: Secondary | ICD-10-CM | POA: Diagnosis not present

## 2023-01-15 DIAGNOSIS — L8990 Pressure ulcer of unspecified site, unspecified stage: Secondary | ICD-10-CM | POA: Diagnosis present

## 2023-01-15 DIAGNOSIS — Z515 Encounter for palliative care: Secondary | ICD-10-CM | POA: Diagnosis not present

## 2023-01-15 LAB — CULTURE, BLOOD (ROUTINE X 2)

## 2023-01-15 LAB — BASIC METABOLIC PANEL
Anion gap: 8 (ref 5–15)
BUN: 14 mg/dL (ref 8–23)
CO2: 25 mmol/L (ref 22–32)
Calcium: 7.6 mg/dL — ABNORMAL LOW (ref 8.9–10.3)
Chloride: 107 mmol/L (ref 98–111)
Creatinine, Ser: 0.55 mg/dL (ref 0.44–1.00)
GFR, Estimated: >60 mL/min (ref >60)
Glucose, Bld: 128 mg/dL — ABNORMAL HIGH (ref 70–99)
Potassium: 3.5 mmol/L (ref 3.5–5.1)
Sodium: 140 mmol/L (ref 135–145)

## 2023-01-15 LAB — PHOSPHORUS: Phosphorus: 2.8 mg/dL (ref 2.5–4.6)

## 2023-01-15 LAB — GLUCOSE, CAPILLARY
Glucose-Capillary: 124 mg/dL — ABNORMAL HIGH (ref 70–99)
Glucose-Capillary: 149 mg/dL — ABNORMAL HIGH (ref 70–99)

## 2023-01-15 LAB — MAGNESIUM: Magnesium: 1.7 mg/dL (ref 1.7–2.4)

## 2023-01-15 MED ORDER — HALOPERIDOL 1 MG PO TABS
0.5000 mg | ORAL_TABLET | ORAL | Status: DC | PRN
Start: 2023-01-15 — End: 2023-01-17

## 2023-01-15 MED ORDER — GLYCOPYRROLATE 1 MG PO TABS: 1.0000 mg | ORAL_TABLET | ORAL | Status: DC | PRN

## 2023-01-15 MED ORDER — LORAZEPAM 2 MG/ML PO CONC
1.0000 mg | ORAL | Status: DC | PRN
Start: 2023-01-15 — End: 2023-01-17

## 2023-01-15 MED ORDER — GERHARDT'S BUTT CREAM
TOPICAL_CREAM | Freq: Two times a day (BID) | CUTANEOUS | Status: DC
  Filled 2023-01-15: qty 1

## 2023-01-15 MED ORDER — FENTANYL CITRATE PF 50 MCG/ML IJ SOSY
25.0000 ug | PREFILLED_SYRINGE | Freq: Four times a day (QID) | INTRAMUSCULAR | Status: DC
  Administered 2023-01-15 – 2023-01-17 (×8): 25 ug via INTRAVENOUS
  Filled 2023-01-15 (×8): qty 1

## 2023-01-15 MED ORDER — OSMOLITE 1.2 CAL PO LIQD
1000.0000 mL | ORAL | Status: DC
  Administered 2023-01-15: 1000 mL
  Filled 2023-01-15: qty 1000

## 2023-01-15 MED ORDER — LORAZEPAM 2 MG/ML IJ SOLN: 1.0000 mg | INTRAMUSCULAR | Status: DC | PRN

## 2023-01-15 MED ORDER — MAGNESIUM SULFATE 2 GM/50ML IV SOLN
2.0000 g | Freq: Once | INTRAVENOUS | Status: AC
  Administered 2023-01-15: 2 g via INTRAVENOUS
  Filled 2023-01-15: qty 50

## 2023-01-15 MED ORDER — HALOPERIDOL LACTATE 5 MG/ML IJ SOLN
0.5000 mg | INTRAMUSCULAR | Status: DC | PRN
  Administered 2023-01-17: 0.5 mg via INTRAVENOUS
  Filled 2023-01-15: qty 1

## 2023-01-15 MED ORDER — GLYCOPYRROLATE 0.2 MG/ML IJ SOLN: 0.2000 mg | INTRAMUSCULAR | Status: DC | PRN

## 2023-01-15 MED ORDER — LORAZEPAM 1 MG PO TABS: 1.0000 mg | ORAL_TABLET | ORAL | Status: DC | PRN

## 2023-01-15 MED ORDER — POTASSIUM & SODIUM PHOSPHATES 280-160-250 MG PO PACK
1.0000 | PACK | Freq: Three times a day (TID) | ORAL | Status: DC
  Filled 2023-01-15 (×2): qty 1

## 2023-01-15 MED ORDER — POTASSIUM CHLORIDE CRYS ER 20 MEQ PO TBCR
20.0000 meq | EXTENDED_RELEASE_TABLET | Freq: Once | ORAL | Status: AC
  Administered 2023-01-15: 20 meq via ORAL
  Filled 2023-01-15: qty 1

## 2023-01-15 MED ORDER — FENTANYL CITRATE PF 50 MCG/ML IJ SOSY
25.0000 ug | PREFILLED_SYRINGE | INTRAMUSCULAR | Status: DC | PRN
  Filled 2023-01-15: qty 1

## 2023-01-15 MED ORDER — HALOPERIDOL LACTATE 2 MG/ML PO CONC: 0.5000 mg | ORAL | Status: DC | PRN

## 2023-01-15 NOTE — Progress Notes (Signed)
NAME:  Amy [name], MRN:  [id number], DOB:  06-08-1945, LOS: 3 ADMISSION DATE:  01/12/2023, CONSULTATION DATE:  01/12/2023 REFERRING MD:  Rubin Payor, EDP CHIEF COMPLAINT:  Septic shock, hyponatremia   History of Present Illness:  77 year old woman admitted with AKI and severe hyponatremia with a history of decreased p.o. intake Recently had a hip fracture for which she had surgery discharged from the hospital to skilled nursing facility,  Concern for urinary tract infection recently, started on antibiotics Has not been eating or drinking much Increasing lethargy led to her being brought to the hospital  Pertinent  Medical History   Past Medical History:  Diagnosis Date   Dyspnea on exertion    Elevated blood sugar    Hair loss    Hashimoto's thyroiditis    Hypothyroidism    Impaired fasting blood sugar    Osteoporosis    Palpitations    Parkinson's disease (HCC)    Parkinsonism (HCC)    Restless leg syndrome 06/21/2019   Ventricular premature beats    Weakness      Significant Hospital Events: Including procedures, antibiotic start and stop dates in addition to other pertinent events   11/3 admitted to ICU, air seen in bladder on CT, vasopressor needs  Interim History / Subjective:  Awake and tracking across the room but not following commands.  Objective   Blood pressure (!) 83/53, pulse (!) 106, temperature 98.8 F (37.1 C), resp. rate 18, weight 42.9 kg, SpO2 94%.        Intake/Output Summary (Last 24 hours) at 01/15/2023 0612 Last data filed at 01/15/2023 0500 Gross per 24 hour  Intake 2132.14 ml  Output 2850 ml  Net -717.86 ml   Filed Weights   01/12/23 1857 01/13/23 0500 01/14/23 0500  Weight: 42.1 kg 42.6 kg 42.9 kg    Examination: Constitutional:ill appearing elderly female.  Cardio:Regular rate and rhythm. Pulm: Normal work of breathing on room air. Abdomen: Soft, non-distended, positive bowel sounds. UUV:[id number] for extremity edema. Skin:Warm and  dry. Neuro:awake and tracking across room but not following commands  Resolved Hospital Problem list     Assessment & Plan:  Septic shock-possibly 2/2 UTI, urine culture positive for Proteus mirabilis and E. coli AKI-improved Hypernatremia-163 peak and down to 149 after hypotonic fluids Concern for colovesicular fistula- seen on CT, not a surgical candidate  Severe protein calorie malnutrition and failure to thrive Parkinson's disease Hypothyroidism  Plan D/c levo Continue midodrine 10 mg TID Continue ceftriaxone and flagyl for 7 days, day 4/7  Goals of care discussion with the patient's husband and son. They would like to remove the feeding tube and if she is unable to eat she will transition to comfort care. No escalation in care but will continue abx.   Stable off vasopressors then she is stable for med surg transfer.   Best Practice (right click and "Reselect all SmartList Selections" daily)   Diet/type: tubefeeds DVT prophylaxis: prophylactic heparin  GI prophylaxis: N/A Lines: N/A Foley:  N/A Code Status:  DNR Last date of multidisciplinary goals of care discussion [01/15/2023]  Labs   CBC: Recent Labs  Lab 01/12/23 1422 01/12/23 1740 01/13/23 0712 01/14/23 0340  WBC 19.5* 25.3* 20.6* 16.7*  NEUTROABS 16.6* 21.7*  --  13.8*  HGB 10.8* 13.8 11.0* 10.3*  HCT 38.5 48.0* 36.1 34.1*  MCV 102.1* 98.0 94.5 93.9  PLT 260 315 268 221    Basic Metabolic Panel: Recent Labs  Lab 01/13/23 0914 01/13/23 1520 01/13/23 2125 01/14/23 0340  01/14/23 0939 01/14/23 0940 01/14/23 1738  NA 163* 163* 156* 149*  --  144  --   K 3.7 3.5 3.3* 3.6  --  3.8  --   CL >130* 130* 126* 122*  --  114*  --   CO2 19* 21* 22 20*  --  20*  --   GLUCOSE 126* 140* 192* 214*  --  204*  --   BUN 81* 69* 61* 39*  --  29*  --   CREATININE 1.48* 1.24* 1.05* 0.89  --  0.73  --   CALCIUM 7.9* 7.8* 7.8* 7.4*  --  7.3*  --   MG  --   --   --  2.2 1.9  --  1.7  PHOS  --   --   --  1.7*  --    --  2.8   GFR: Estimated Creatinine Clearance: 41.1 mL/min (by C-G formula based on SCr of 0.73 mg/dL). Recent Labs  Lab 01/12/23 1422 01/12/23 1425 01/12/23 1740 01/12/23 2022 01/13/23 0712 01/14/23 0340  WBC 19.5*  --  25.3*  --  20.6* 16.7*  LATICACIDVEN  --  3.5*  --  2.2*  --   --     Liver Function Tests: Recent Labs  Lab 01/12/23 1556 01/12/23 1740  AST 58* 60*  ALT 21 24  ALKPHOS 174* 200*  BILITOT 1.6* 1.5*  PROT 6.3* 6.2*  ALBUMIN 2.7* 2.8*   No results for input(s): "LIPASE", "AMYLASE" in the last 168 hours. No results for input(s): "AMMONIA" in the last 168 hours.  ABG No results found for: "PHART", "PCO2ART", "PO2ART", "HCO3", "TCO2", "ACIDBASEDEF", "O2SAT"   Coagulation Profile: Recent Labs  Lab 01/12/23 1422  INR 1.9*    Cardiac Enzymes: No results for input(s): "CKTOTAL", "CKMB", "CKMBINDEX", "TROPONINI" in the last 168 hours.  HbA1C: Hgb A1c MFr Bld  Date/Time Value Ref Range Status  01/12/2023 08:22 PM 5.7 (H) 4.8 - 5.6 % Final    Comment:    (NOTE)         Prediabetes: 5.7 - 6.4         Diabetes: >6.4         Glycemic control for adults with diabetes: <7.0     CBG: Recent Labs  Lab 01/14/23 0758 01/14/23 1129 01/14/23 1525 01/14/23 1948 01/14/23 2357  GLUCAP 200* 152* 133* 110* 122*    Review of Systems:   Unable to obtain  Past Medical History:  She,  has a past medical history of Dyspnea on exertion, Elevated blood sugar, Hair loss, Hashimoto's thyroiditis, Hypothyroidism, Impaired fasting blood sugar, Osteoporosis, Palpitations, Parkinson's disease (HCC), Parkinsonism (HCC), Restless leg syndrome (06/21/2019), Ventricular premature beats, and Weakness.   Surgical History:   Past Surgical History:  Procedure Laterality Date   CATARACT EXTRACTION Bilateral    CESAREAN SECTION     x2   CHOLECYSTECTOMY     FOOT SURGERY Right 01/15/2016   INTRAMEDULLARY (IM) NAIL INTERTROCHANTERIC Right 12/24/2022   Procedure:  INTRAMEDULLARY (IM) NAIL INTERTROCHANTERIC;  Surgeon: Luci Bank, MD;  Location: [facility] OR;  Service: Orthopedics;  Laterality: Right;   WRIST SURGERY  1999     Social History:   reports that she has never smoked. She has never used smokeless tobacco. She reports that she does not drink alcohol and does not use drugs.   Family History:  Her family history includes Breast cancer in her mother; Lung disease in her brother. There is no history of Parkinson's disease.  Allergies Allergies  Allergen Reactions   Ciprofloxacin Rash     Home Medications  Prior to Admission medications   Medication Sig Start Date End Date Taking? Authorizing Provider  acetaminophen (TYLENOL) 325 MG tablet Take 650 mg by mouth every 6 (six) hours as needed for moderate pain (pain score 4-6).   Yes [provider]  amantadine (SYMMETREL) 100 MG capsule TAKE 1 CAPSULE BY MOUTH EVERY MORNING, AT NOON & AT BEDTIME Patient taking differently: Take 100 mg by mouth 3 (three) times daily. 07/11/22  Yes Ihor Austin, NP  aspirin EC 81 MG tablet Take 1 tablet (81 mg total) by mouth in the morning and at bedtime. Take after 12 weeks for prevention of preeclampsia later in pregnancy 12/30/22 01/29/23 Yes Wouk, Wilfred Curtis, MD  bisacodyl (FLEET) 10 MG/30ML ENEM Place 10 mg rectally once. Insert 1 suppository rectally every 24 hours as needed if no relief from MOM-constipation.   Yes [provider]  carbidopa-levodopa (SINEMET IR) 25-100 MG tablet TAKE 1 TABLET BY MOUTH 8 TIMES DAILY-6 AM, 8 AM, 10 AM, 12 PM, 2 PM, 4 PM, 6 PM AND 8 PM Patient taking differently: Take 1 tablet by mouth See admin instructions. Take 1 tablet by mouth every other hour or 1.5 hours 10/21/22  Yes Huston Foley, MD  magnesium hydroxide (MILK OF MAGNESIA) 400 MG/5ML suspension Take 30 mLs by mouth daily as needed for mild constipation or moderate constipation.   Yes [provider]  nitrofurantoin, macrocrystal-monohydrate,  (MACROBID) 100 MG capsule Take 100 mg by mouth 2 (two) times daily. For 5 days for UTI   Yes [provider]  oxyCODONE (OXY IR/ROXICODONE) 5 MG immediate release tablet Take 1 tablet (5 mg total) by mouth every 6 (six) hours as needed for breakthrough pain ((for MODERATE breakthrough pain)). Patient taking differently: Take 2.5 mg by mouth every 8 (eight) hours as needed for breakthrough pain ((for MODERATE breakthrough pain)). 12/30/22  Yes Wouk, Wilfred Curtis, MD  pramipexole (MIRAPEX) 1 MG tablet Take 1 tablet (1 mg total) by mouth 3 (three) times daily. 05/13/22  Yes McCue, Shanda Bumps, NP  saccharomyces boulardii (FLORASTOR) 250 MG capsule Take 250 mg by mouth 2 (two) times daily.   Yes [provider]  sodium chloride 0.45 % Inject 50 mLs into the vein once. Prophylaxis until 01/13/2023 23:59. 51ml/hr   Yes [provider]  Sodium Phosphates (ENEMA RE) Place 1 Application rectally daily as needed.   Yes [provider]  SYNTHROID 75 MCG tablet Take 75 mcg by mouth daily before breakfast. 11/16/18  Yes [provider]     Critical care time: 33    Rocky Morel, DO Internal Medicine Resident, PGY-2 Pager# [phone] [place] [facility] 01/15/2023 6:12 AM  For contact information, see Amion. If no response to pager, please call PCCM consult pager. After hours, 7PM- 7AM, please call Elink.

## 2023-01-15 NOTE — Progress Notes (Signed)
   Palliative Medicine Inpatient Follow Up Note HPI: 77 year old woman admitted with AKI and severe hyponatremia with a history of decreased p.o. intake. Recently had a hip fracture for which she had surgery discharged from the hospital to skilled nursing facility.   Palliative care has been asked to assist with additional goals of care conversations.   Today's Discussion 01/15/2023  *Please note that this is a verbal dictation therefore any spelling or grammatical errors are due to the "Dragon [employer] One" system interpretation.  Chart reviewed inclusive of vital signs, progress notes, laboratory results, and diagnostic images.   I met with Amy [name] at bedside this afternoon. She appears uncomfortable overall with the coretrack in place.  Dr. Geraldo Pitter was able to provide patients spouse, Amy [name] and son, Amy [name] an update. He shared that patient does not appear to be improving. He discussed removing the coretrack and if patient neglects to eat and drink then transitioning her to comfort care.  Created space and opportunity for patients family to explore thoughts feelings and fears regarding current medical situation. We discussed her poor prognosis overall. Patients family express that it is hard to absorb the information provided. Allowing time for them to express themselves regarding this - utilized reflective listening  We reviewed again the plan for today to remove the coretrack, have a speech evaluation, if patient can participate in eating and drinking that would be wonderful. If patient is unable to participate consider comfort care. I again expressed that I feel this is the likely direction we are heading.   Questions and concerns addressed/Palliative Support Provided.   Objective Assessment: Vital Signs Vitals:   01/15/23 1200 01/15/23 1300  BP: 104/61 (!) 87/50  Pulse: 78 78  Resp: 19 (!) 24  Temp: 97.9 F (36.6 C) 98.4 F (36.9 C)  SpO2: 95% 96%    Intake/Output Summary  (Last 24 hours) at 01/15/2023 1350 Last data filed at 01/15/2023 1300 Gross per 24 hour  Intake 1594.79 ml  Output 2490 ml  Net -895.21 ml   Last Weight  Most recent update: 01/15/2023  6:35 AM    Weight  43.4 kg (95 lb 10.9 oz)            Gen:  Frail elderly acutely ill appearing F HEENT: Coretrack in place, dry mucous membranes CV: Regular rate and rhythm  PULM: On RA, breathing is even and nonlabored  ABD: soft/nontender  EXT: (+) contractures of BUE, onychomycosis of nails Neuro: Opens eyes though not responsive  SUMMARY OF RECOMMENDATIONS   DNAR/DNI  Remove core-track and see if patient can participate in eating/drinking  If no improvements in the next 24 hours continue with conversations related to comfort care  PMT will continue to support Amy [name] and her family during this difficult time --> I will not be present tomorrow though I will sign out to one of my colleagues for follow up  Billing based on MDM: High ______________________________________________________________________________________ Amy [name] Palliative Medicine Team Team Cell Phone: [phone] Please utilize secure chat with additional questions, if there is no response within 30 minutes please call the above phone number  Palliative Medicine Team providers are available by phone from 7am to 7pm daily and can be reached through the team cell phone.  Should this patient require assistance outside of these hours, please call the patient's attending physician.

## 2023-01-15 NOTE — Consult Note (Addendum)
WOC Nurse Consult Note: this consult performed remotely after review of EMR including photo documentation  Reason for Consult: Pressure injury sacrum, blisters to R lateral foot  Wound type: Deep Tissue Pressure Injuries  Pressure Injury POA: Yes Measurement: see nursing flowsheet  Wound bed:1.  R foot base of 5th digit and R lateral mid foot purple maroon discoloration, skin intact  2.  Deep Tissue Pressure Injury Sacrum purple maroon discoloration evolving with partial thickness skin loss  Drainage (amount, consistency, odor) none  Periwound: Erythema to R lateral foot wounds, sacrum with mild erythema and moisture associated skin damage  Dressing procedure/placement/frequency: Clean R lateral foot wounds with NS, apply Xeroform gauze [place] [phone]) to wound beds daily, cover with dry gauze and Kerlix roll gauze. Place R foot in Prevalon boot to offload pressure [place] [phone]) while in bed.   Clean Sacrum with soap and water, apply Xeroform gauze to wound bed (purple maroon discoloration) daily.  Cover surrounding skin to sacrum and buttocks with Gerhardt's Butt Cream 2 times daily and prn soiling.   POC discussed with bedside nurse. WOC team will not follow. Re-consult if further needs arise.   Thank you,    Priscella Mann MSN, RN-BC, [employer] [phone]

## 2023-01-15 NOTE — Progress Notes (Signed)
Cortrak removed and SLP evaluation without ability to safely feed. Discussed with husband over the phone who agrees with transition to full comfort care. Orders placed. TRH to assume care in the morning and patient has a Med Surg bed.    Rocky Morel, DO Internal Medicine Resident, PGY-2 Pager# [phone] [place] [facility] 01/15/2023 2:41 PM  For contact information, see Amion. If no response to pager, please call PCCM consult pager. After hours, 7PM- 7AM, please call Elink.

## 2023-01-15 NOTE — Plan of Care (Addendum)
Patient was still on norepinephrine drip this morning. Case was discussed with Dr Joneen Roach PCCM will resume the care today. Plan for stopping norepinephrine drip today,we have added the patient for pick up tomorrow by [facility] team.

## 2023-01-15 NOTE — TOC Initial Note (Signed)
Transition of Care [facility]) - Initial/Assessment Note    Patient Details  Name: Amy [name] MRN: [id number] Date of Birth: [birth year]  [facility] CM/SW Contact:    Harriet Masson, RN Phone Number: 01/15/2023, 1:20 PM  Clinical Narrative:                  Patient readmitted to hospital from [facility] for sepsis. TOC following for transition needs.  Expected Discharge Plan: Skilled Nursing Facility Barriers to Discharge: Continued Medical Work up   Patient Goals and CMS Choice            Expected Discharge Plan and Services                                              Prior Living Arrangements/Services                       Activities of Daily Living      Permission Sought/Granted                  Emotional Assessment              Admission diagnosis:  Dehydration [E86.0] AKI (acute kidney injury) (HCC) [N17.9] Sepsis secondary to UTI (HCC) [A41.9, N39.0] Septic shock (HCC) [A41.9, R65.21] Patient Active Problem List   Diagnosis Date Noted   Protein-calorie malnutrition, severe 01/13/2023   Septic shock (HCC) 01/12/2023   Hypothyroidism 12/24/2022   Leukocytosis 12/24/2022   Dehydration 12/24/2022   Malnutrition of moderate degree 12/24/2022   Hip fracture (HCC) 12/23/2022   Restless leg syndrome 06/21/2019   Memory loss 01/29/2018   Parkinson's disease (HCC) 01/27/2017   PCP:  Georgann Housekeeper, MD Pharmacy:   [facility] [phone] - [place], [place] - [address] AT [address] OF [address] [place] [address] Phone: [phone] Fax: [phone]     Social Determinants of Health (SDOH) Social History: SDOH Screenings   Tobacco Use: Low Risk  (12/24/2022)   SDOH Interventions:     Readmission Risk Interventions     No data to display

## 2023-01-15 NOTE — Progress Notes (Signed)
Patient transferred to 2W room 22, without any complication.

## 2023-01-15 NOTE — Evaluation (Signed)
Clinical/Bedside Swallow Evaluation Patient Details  Name: Amy [name] MRN: [id number] Date of Birth: 09/13/1945  Today's Date: 01/15/2023 Time: SLP Start Time (ACUTE ONLY): 1410 SLP Stop Time (ACUTE ONLY): 1427 SLP Time Calculation (min) (ACUTE ONLY): 17 min  Past Medical History:  Past Medical History:  Diagnosis Date   Dyspnea on exertion    Elevated blood sugar    Hair loss    Hashimoto's thyroiditis    Hypothyroidism    Impaired fasting blood sugar    Osteoporosis    Palpitations    Parkinson's disease (HCC)    Parkinsonism (HCC)    Restless leg syndrome 06/21/2019   Ventricular premature beats    Weakness    Past Surgical History:  Past Surgical History:  Procedure Laterality Date   CATARACT EXTRACTION Bilateral    CESAREAN SECTION     x2   CHOLECYSTECTOMY     FOOT SURGERY Right 01/15/2016   INTRAMEDULLARY (IM) NAIL INTERTROCHANTERIC Right 12/24/2022   Procedure: INTRAMEDULLARY (IM) NAIL INTERTROCHANTERIC;  Surgeon: Luci Bank, MD;  Location: [facility];  Service: Orthopedics;  Laterality: Right;   WRIST SURGERY  1999   HPI:  Amy [name] is a 77 yo female admitted with AKI and sepsis 2/2 UTI. Cortrak placed 11/4. PMH includes dyspnea on exertion, Hashimoto's thyroiditis, hypothyroidism, osteoporosis, PD, restless leg syndrome, ventricular premature beats    Assessment / Plan / Recommendation  Clinical Impression  Pt presents with significant internal distraction which limits her ability to participate in functional activities. SLP provided thorough oral care, removing dried secretions from her labial and lingual surfaces. Attempted presentations of ice chips, thin liquids, and purees which all resulted in anterior spillage of the majority of each bolus without any palpable initiation of a swallow. Recommend she remain NPO with meds provided via alternative means. Hopeful to coordinate next visit with family to provide education regarding liberalization of diet pending  GOC. SLP will follow. SLP Visit Diagnosis: Dysphagia, unspecified (R13.10)    Aspiration Risk  Moderate aspiration risk    Diet Recommendation NPO    Medication Administration: Via alternative means    Other  Recommendations Oral Care Recommendations: Oral care QID;Staff/trained caregiver to provide oral care    Recommendations for follow up therapy are one component of a multi-disciplinary discharge planning process, led by the attending physician.  Recommendations may be updated based on patient status, additional functional criteria and insurance authorization.  Follow up Recommendations Skilled nursing-short term rehab (<3 hours/day)      Assistance Recommended at Discharge    Functional Status Assessment Patient has had a recent decline in their functional status and/or demonstrates limited ability to make significant improvements in function in a reasonable and predictable amount of time  Frequency and Duration min 2x/week  1 week       Prognosis Prognosis for improved oropharyngeal function: Guarded Barriers to Reach Goals: Time post onset;Severity of deficits      Swallow Study   General HPI: Karri Kallenbach is a 77 yo female admitted with AKI and sepsis 2/2 UTI. Cortrak placed 11/4. PMH includes dyspnea on exertion, Hashimoto's thyroiditis, hypothyroidism, osteoporosis, PD, restless leg syndrome, ventricular premature beats Type of Study: Bedside Swallow Evaluation Previous Swallow Assessment: none in chart Diet Prior to this Study: NPO Temperature Spikes Noted: No Respiratory Status: Room air History of Recent Intubation: No Behavior/Cognition: Alert;Requires cueing Oral Cavity Assessment: Dry Oral Care Completed by SLP: Yes Oral Cavity - Dentition: Adequate natural dentition Vision: Functional for self-feeding Self-Feeding Abilities: Total  assist Patient Positioning: Upright in bed Baseline Vocal Quality: Not observed Volitional Cough: Cognitively unable to  elicit Volitional Swallow: Unable to elicit    Oral/Motor/Sensory Function Overall Oral Motor/Sensory Function: Within functional limits   Ice Chips Ice chips: Impaired Presentation: Spoon Oral Phase Impairments: Poor awareness of bolus;Reduced lingual movement/coordination;Reduced labial seal   Thin Liquid Thin Liquid: Impaired Presentation: Spoon Oral Phase Impairments: Poor awareness of bolus;Reduced lingual movement/coordination;Reduced labial seal    Nectar Thick Nectar Thick Liquid: Not tested   Honey Thick Honey Thick Liquid: Not tested   Puree Puree: Impaired Presentation: Spoon Oral Phase Impairments: Poor awareness of bolus;Reduced lingual movement/coordination   Solid     Solid: Not tested      Gwynneth Aliment, M.A., CF-SLP Speech Language Pathology, Acute Rehabilitation Services  Secure Chat preferred [phone]  01/15/2023,2:46 PM

## 2023-01-15 NOTE — Progress Notes (Signed)
Nutrition Follow-up  DOCUMENTATION CODES:  Severe malnutrition in context of chronic illness, Underweight  INTERVENTION:  Begin to advance tube feeding via Cortrak: Osmolite 1.2 at 45 ml/h (1080 ml per day) Advance to 35 and continue to advance by 10 q12h to goal Free water flush: q4h  This provides: 1296 kcal, 60 gm protein, 886 ml free water daily ( TF+flush) Continue 100mg  of thiamine thru 5 day duration  NUTRITION DIAGNOSIS:  Severe Malnutrition related to chronic illness (parkinsons, memory loss) as evidenced by severe fat depletion, severe muscle depletion, percent weight loss. - remains applicable  GOAL:  Patient will meet greater than or equal to 90% of their needs - progressing, TF infusing  MONITOR:  Labs, Weight trends, TF tolerance, Skin  REASON FOR ASSESSMENT:  Consult Enteral/tube feeding initiation and management  ASSESSMENT:  Pt admitted with sepsis and AKI; recently admitted 10/14-10/21 for hip fracture s/p repair and discharge to SNF where she has had increased lethargy, decreased po intake and interaction. PMH significant for hair loss, Hashimoto's thyroiditis, osteoporosis, Parkinson's disease.  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 Pt not a good candidate for surgical repair due to frailty and  11/3 - admitted with septic shock 11/4 - cortrak place, trickle TF initiated  Pt resting in bed at the time of assessment. No family present at this time.  TF currently infusing via cortrak. Pt did show signs of refeeding syndrome with initiation of feeds but electrolytes were replaced by [facility] and are stable this AM. Discussed with MD, ok to begin advancing TF to goal.  Noted PMT met with family and have decided to not escalate care but would like to continue current levels of treatment.   Admit weight: 42.1 kg   Current weight: 43.4 kg Review of weight history reflects pt to have had a weight loss of 14.6% within the last 9 months  which is clinically significant for time frame.    Intake/Output Summary (Last 24 hours) at 01/15/2023 0847 Last data filed at 01/15/2023 7829 Gross per 24 hour  Intake 1864.15 ml  Output 3000 ml  Net -1135.85 ml  Net IO Since Admission: 813.84 mL [01/15/23 0847]  Drains/Lines: Cortrak: terminates in distal stomach or proximal duodenum  Nutritionally Relevant Medications: Scheduled Meds:  free water  200 mL Per Tube Q4H   insulin aspart  0-9 Units Subcutaneous Q6H   metroNIDAZOLE  500 mg Per Tube Q12H   thiamine  100 mg Per Tube Daily   Continuous Infusions:  cefTRIAXone (ROCEPHIN)  IV Stopped (01/14/23 1219)   feeding supplement (OSMOLITE 1.2 CAL) 20 mL/hr at 01/15/23 0600   norepinephrine (LEVOPHED) Adult infusion 1 mcg/min (01/15/23 0600)   PRN Meds: docusate sodium, polyethylene glycol  Labs Reviewed: CBG ranges from 110-200 mg/dL over the last 24 hours HgbA1c 5.7%  NUTRITION - FOCUSED PHYSICAL EXAM: Flowsheet Row Most Recent Value  Orbital Region Severe depletion  Upper Arm Region Severe depletion  Thoracic and Lumbar Region Severe depletion  Buccal Region Severe depletion  Temple Region Severe depletion  Clavicle Bone Region Severe depletion  Clavicle and Acromion Bone Region Severe depletion  Scapular Bone Region Severe depletion  Dorsal Hand Severe depletion  Patellar Region Severe depletion  Anterior Thigh Region Severe depletion  Posterior Calf Region Severe depletion  Edema (RD Assessment) None  Hair Reviewed  Eyes Unable to assess  Mouth Reviewed  Skin Reviewed  Nails Reviewed    Diet Order:   Diet Order  Diet NPO time specified  Diet effective now                   EDUCATION NEEDS:  No education needs have been identified at this time  Skin:  Skin Assessment: Skin Integrity Issues: Skin Integrity Issues:: Stage I Stage I: L ischial tuberosity  Last BM:  11/5 - type 1  Height:  Ht Readings from Last 1 Encounters:   01/15/23 5\' 2"  (1.575 m)    Weight:  Wt Readings from Last 1 Encounters:  01/15/23 43.4 kg    Ideal Body Weight:  50 kg  BMI:  Body mass index is 17.5 kg/m.  Estimated Nutritional Needs:  Kcal:  1300-1500 Protein:  60-75g Fluid:  1.3-1.5L    [name], RD, LDN Clinical Dietitian RD pager # available in [facility]  After hours/weekend pager # available in [facility]

## 2023-01-16 DIAGNOSIS — R6521 Severe sepsis with septic shock: Secondary | ICD-10-CM | POA: Diagnosis not present

## 2023-01-16 DIAGNOSIS — A419 Sepsis, unspecified organism: Secondary | ICD-10-CM | POA: Diagnosis not present

## 2023-01-16 DIAGNOSIS — R1310 Dysphagia, unspecified: Secondary | ICD-10-CM

## 2023-01-16 LAB — URINE CULTURE: Culture: >=100000 — AB

## 2023-01-16 NOTE — Assessment & Plan Note (Signed)
Resolved. Caused by inadequate PO intake of fluids prior to admission. She presented with a creatinine of 2.81. It has now corrected to 0.55 as of her last lab on 01/15/2023. She is now comfort care only.

## 2023-01-16 NOTE — Consult Note (Signed)
[facility] Liaison Consult Note    01/16/2023  Amy [name] 1945/10/11 [id number]  Insurance:  Medicare ACO REACH  [facility] [[facility]I] remote coverage review for patient admitted to [facility]  Primary Care Provider: Georgann Housekeeper, MD with Deboraha Sprang at [facility]   Chart reviewed for less than 30 days readmission and Palliative Care and inpatient [facility] RN notes reveals the patient is currently transitioning to Hospice Care for possible Hospice facility.   Plan: Patient will have full care coordination services through Hospice and needs will be met at the hospice level of care. No planned follow up for transitional needs. Will sign off at transition from hospital.  For questions,   .  Charlesetta Shanks, RN, BSN, CCM [employer], [facility] Liaison Direct Dial: [phone] or secure chat Email: [name]

## 2023-01-16 NOTE — Progress Notes (Signed)
Progress Note   Patient: Amy [name] ZOX:[id number] DOB: [birth year] DOA: 01/12/2023     4 DOS: the patient was seen and examined on 01/16/2023   Brief hospital course: 77 year old woman admitted with AKI and severe hyponatremia with a history of decreased p.o. intake Recently had a hip fracture for which she had surgery discharged from the hospital to skilled nursing facility,  Concern for urinary tract infection recently, started on antibiotics. The patient has not been eating or drinking much. She presented to the hospital with severe lethargy.   She was admitted to the ICU for septic shock requiring levophed. She also had severe hypernatremia, AKI, Hypotension, hypokalemia, hypophosphatemia, failure to thrive, and UTI. In the ICU she received free water infusions, pain control, and trickle tube feeds. SLP has evaluated her swallow and she is felt to be at high risk of aspiration. She is receiving nutrition by tube feeds and hydration by fluid bolus by tube.  She was transferred out to the floor on 01/15/2023. Her family has met with palliative care. She is now comfort care. The family would like the patient to go to [facility]. The patient is awaiting approval. Unfortunately they have no beds at this time.  Assessment and Plan: Septic shock (HCC) Due to UTI. Urine culture resulted on 01/13/2023 has grown out E. Coli and Proteus mirabilis. The patient received ceftriaxone and vancomycin for this. These have now been stopped. The patient is comfort care only.  Protein-calorie malnutrition, severe Noted. The patient has had very poor PO intake. She is deemed to be at high risk for aspiration. She is NPO for this reason. She had been receiving osmolite 1.2 cal with free water per tube. This has now been stopped as she is comfort care.  Pressure injury of skin The patient has a stage 1 ulcer on the left ischial tuberosity, an unstagable wound to the sacrum with full thickness tissue loss., and  two blisters on the anterior of the right foot. She is now comfort care only.  Hypernatremia Due to patient with inadequate intake of water prior to admission. This has been resolved with few water boluses by tube and IV fluids. Her sodium upon admission was 160. Her last labs on the morning of 01/15/2023 showed a sodium of 140. She is now comfort care only.   AKI (acute kidney injury) (HCC) Resolved. Caused by inadequate PO intake of fluids prior to admission. She presented with a creatinine of 2.81. It has now corrected to 0.55 as of her last lab on 01/15/2023. She is now comfort care only.  Dysphagia The patient is deemed too high risk for PO intake. She is now comfort care only.        Subjective: The patient is lying quietly in bed. She is not verbally interactive with me.  Physical Exam: Vitals:   01/15/23 1548 01/16/23 0453 01/16/23 0723 01/16/23 1510  BP: (!) 85/42 (!) 76/44 (!) 83/46 (!) 94/58  Pulse: 73 80 82 89  Resp:    16  Temp: 97.9 F (36.6 C) 97.7 F (36.5 C)  98 F (36.7 C)  TempSrc: Axillary Oral  Oral  SpO2: 97% 98% 98% 96%  Weight:      Height:       Exam:  Constitutional:  The patient is awake, but not verbaly communicative. Respiratory:  No increased work of breathing. No wheezes, rales, or rhonchi No tactile fremitus Cardiovascular:  Regular rate and rhythm No murmurs, ectopy, or gallups. No lateral PMI. No  thrills. Abdomen:  Abdomen is soft, non-tender, non-distended No hernias, masses, or organomegaly Normoactive bowel sounds.  Musculoskeletal:  No cyanosis, clubbing, or edema Skin:  No rashes, lesions, ulcers palpation of skin: no induration or nodules Neurologic:  Unable to evaluate as patient is unable to cooperate with exam. Psychiatric:  Unable to evaluate as patient is unable to cooperate with exam.  Data Reviewed:    Family Communication: None available  Disposition: Status is: Inpatient Remains inpatient appropriate  because: She is comfort care only and is awaiting disposition to hospice.  Planned Discharge Destination:  hospice    Time spent: 34 minutes  Author: Samina Weekes, DO 01/16/2023 4:18 PM  For on call review [email or web address].

## 2023-01-16 NOTE — Assessment & Plan Note (Signed)
The patient is deemed too high risk for PO intake. She is now comfort care only.

## 2023-01-16 NOTE — Progress Notes (Signed)
   Palliative Medicine Inpatient Follow Up Note HPI: 77 year old woman admitted with AKI and severe hyponatremia with a history of decreased p.o. intake. Recently had a hip fracture for which she had surgery discharged from the hospital to skilled nursing facility.   Palliative care has been asked to assist with additional goals of care conversations.   Today's Discussion 01/16/2023  *Please note that this is a verbal dictation therefore any spelling or grammatical errors are due to the "Dragon Medical One" system interpretation.  Chart reviewed inclusive of vital signs, progress notes, laboratory results, and diagnostic images.  Amy [name] failed her swallow evaluation yesterday afternoon. Family spoke to the CCM team and decided to move towards full comfort care.  I met with Amy [name] at bedside this morning. She appears comfortable and exemplifies no signs of distress.   I called and spoke with patients spouse, Amy [name] and son, Amy [name]. We discussed patients present state. We reviewed the idea of transitioning her to inpatient hospice at [facility]. Patients family in agreement with the referral.  Offered empathetic listening to patients family given the difficulty of the decision they made.   Questions and concerns addressed/Palliative Support Provided.   Objective Assessment: Vital Signs Vitals:   01/16/23 0453 01/16/23 0723  BP: (!) 76/44 (!) 83/46  Pulse: 80 82  Resp:    Temp: 97.7 F (36.5 C)   SpO2: 98% 98%    Intake/Output Summary (Last 24 hours) at 01/16/2023 1610 Last data filed at 01/15/2023 1300 Gross per 24 hour  Intake 236.42 ml  Output 110 ml  Net 126.42 ml   Last Weight  Most recent update: 01/15/2023  6:35 AM    Weight  43.4 kg (95 lb 10.9 oz)            Gen:  Frail elderly acutely ill appearing F HEENT: Coretrack in place, dry mucous membranes CV: Regular rate and rhythm  PULM: On RA, breathing is even and nonlabored  ABD: soft/nontender  EXT: (+)  contractures of BUE, onychomycosis of nails Neuro: Opens eyes though not responsive  SUMMARY OF RECOMMENDATIONS   DNAR/DNI  Comfort Care  Low dose fentanyl ATC and PRN  Additional comfort medications per [facility]  TOC - Referral to [facility]  Ongoing PMT support  Billing based on MDM: High ______________________________________________________________________________________ Lamarr Lulas [place] Palliative Medicine Team Team Cell Phone: [phone] Please utilize secure chat with additional questions, if there is no response within 30 minutes please call the above phone number  Palliative Medicine Team providers are available by phone from 7am to 7pm daily and can be reached through the team cell phone.  Should this patient require assistance outside of these hours, please call the patient's attending physician.

## 2023-01-16 NOTE — Assessment & Plan Note (Signed)
The patient has a stage 1 ulcer on the left ischial tuberosity, an unstagable wound to the sacrum with full thickness tissue loss., and two blisters on the anterior of the right foot. She is now comfort care only.

## 2023-01-16 NOTE — Progress Notes (Signed)
Transition of Care [facility]) - Inpatient Brief Assessment   Patient Details  Name: Aniya Jolicoeur MRN: [id number] Date of Birth: 1945/10/11  [facility] CM/SW Contact:    Janae Bridgeman, RN Phone Number: 01/16/2023, 2:54 PM   Clinical Narrative: CM spoke with Marcelino Duster, NP with Palliative Care medicine this morning and patient's family was offered Medicare choice regarding Hospice services and the family requested Inpatient hospice placement with [facility] placed.  Patient's clinicals are currently being reviewed by physician [facility] for patient's appropriateness for the facility.  Thea Gist, RNCM with [facility] met with the patient's family - pending inpatient approval and bed availability at this time.  I requested DNR be signed and placed copy on the hard chart on the unit.   Transition of Care Asessment: Insurance and Status: (P) Insurance coverage has been reviewed Patient has primary care physician: (P) Yes Home environment has been reviewed: (P) From [facility] SNF after recent hip surgery - prior to was living with husband at the home Prior level of function:: (P) family assistance Prior/Current Home Services: (P) No current home services Social Determinants of Health Reivew: (P) SDOH reviewed interventions complete Readmission risk has been reviewed: (P) Yes Transition of care needs: (P) transition of care needs identified, TOC will continue to follow

## 2023-01-16 NOTE — Progress Notes (Signed)
[facility] 1O10 [facility] hospital liaison note  Referral received for family interest in [facility]. Met with family at bedside and explained hospice services and philosophy of care.  Chart under review and approval is pending at this time.   Unfortunately, [facility] does not have a bed to offer today. TOC aware. Liaison will follow up tomorrow or sooner if bed becomes available.   Thank you for allowing [place] to participate in this patient's plan of care.  Henderson Newcomer, LPN Hospice hospital liaison [phone]

## 2023-01-16 NOTE — Assessment & Plan Note (Signed)
Due to patient with inadequate intake of water prior to admission. This has been resolved with few water boluses by tube and IV fluids. Her sodium upon admission was 160. Her last labs on the morning of 01/15/2023 showed a sodium of 140. She is now comfort care only.

## 2023-01-16 NOTE — Plan of Care (Signed)
  Problem: Education: Goal: Knowledge of General Education information will improve Description: Including pain rating scale, medication(s)/side effects and non-pharmacologic comfort measures Outcome: Progressing   Problem: Pain Management: Goal: General experience of comfort will improve Outcome: Progressing   Problem: Safety: Goal: Ability to remain free from injury will improve Outcome: Progressing   Problem: Skin Integrity: Goal: Risk for impaired skin integrity will decrease Outcome: Progressing   Problem: Skin Integrity: Goal: Risk for impaired skin integrity will decrease Outcome: Progressing   Problem: Education: Goal: Knowledge of the prescribed therapeutic regimen will improve Outcome: Progressing   Problem: Role Relationship: Goal: Family's ability to cope with current situation will improve Outcome: Progressing Goal: Ability to verbalize concerns, feelings, and thoughts to partner or family member will improve Outcome: Progressing   Problem: Pain Management: Goal: Satisfaction with pain management regimen will improve Outcome: Progressing

## 2023-01-16 NOTE — Assessment & Plan Note (Signed)
Noted. The patient has had very poor PO intake. She is deemed to be at high risk for aspiration. She is NPO for this reason. She had been receiving osmolite 1.2 cal with free water per tube. This has now been stopped as she is comfort care.

## 2023-01-16 NOTE — Assessment & Plan Note (Signed)
Due to UTI. Urine culture resulted on 01/13/2023 has grown out E. Coli and Proteus mirabilis. The patient received ceftriaxone and vancomycin for this. These have now been stopped. The patient is comfort care only.

## 2023-01-17 DIAGNOSIS — E860 Dehydration: Secondary | ICD-10-CM

## 2023-01-17 DIAGNOSIS — A419 Sepsis, unspecified organism: Secondary | ICD-10-CM | POA: Diagnosis not present

## 2023-01-17 DIAGNOSIS — R6521 Severe sepsis with septic shock: Secondary | ICD-10-CM | POA: Diagnosis not present

## 2023-01-17 DIAGNOSIS — E43 Unspecified severe protein-calorie malnutrition: Secondary | ICD-10-CM

## 2023-01-17 LAB — CULTURE, BLOOD (ROUTINE X 2)
Culture: NO GROWTH
Special Requests: ADEQUATE

## 2023-01-17 MED ORDER — MORPHINE SULFATE (CONCENTRATE) 20 MG/ML PO SOLN: 5.0000 mg | ORAL | 0 refills | Status: AC | PRN

## 2023-01-17 MED ORDER — FENTANYL CITRATE PF 50 MCG/ML IJ SOSY
25.0000 ug | PREFILLED_SYRINGE | INTRAMUSCULAR | Status: DC | PRN
  Administered 2023-01-17: 25 ug via INTRAVENOUS

## 2023-01-17 MED ORDER — GERHARDT'S BUTT CREAM: 1.0000 | TOPICAL_CREAM | Freq: Two times a day (BID) | CUTANEOUS | 0 refills | Status: AC

## 2023-01-17 MED ORDER — POLYETHYLENE GLYCOL 3350 17 G PO PACK: 17.0000 g | PACK | Freq: Every day | ORAL | 0 refills | Status: AC | PRN

## 2023-01-17 MED ORDER — FENTANYL CITRATE PF 50 MCG/ML IJ SOSY
50.0000 ug | PREFILLED_SYRINGE | INTRAMUSCULAR | Status: DC
  Administered 2023-01-17: 50 ug via INTRAVENOUS
  Filled 2023-01-17: qty 1

## 2023-01-17 MED ORDER — LORAZEPAM 2 MG/ML PO CONC: 1.0000 mg | ORAL | 0 refills | Status: AC | PRN

## 2023-01-17 NOTE — Care Management Important Message (Signed)
Important Message  Patient Details  Name: Amy [name] MRN: [id number] Date of Birth: 01-29-1946   Important Message Given:  Yes - Medicare IM     Dorena Bodo 01/17/2023, 3:14 PM

## 2023-01-17 NOTE — TOC Transition Note (Signed)
Transition of Care [facility] - CM/SW Discharge Note   Patient Details  Name: Amy [name] MRN: [id number] Date of Birth: 1945/08/27  Transition of [facility] CM/SW Contact:  Janae Bridgeman, RN Phone Number: 01/17/2023, 3:02 PM   Clinical Narrative:    CM spoke with [facility] and the patient has been accepted at the facility.  Consents have been signed by the family.  I called [facility] and ambulance transport has been set up for transport to the facility.   Bedside nursing is aware and will call report to [facility] at [phone].       Barriers to Discharge: Continued Medical Work up   Patient Goals and CMS Choice      Discharge Placement                         Discharge Plan and Services Additional resources added to the After Visit Summary for                                       Social Determinants of Health (SDOH) Interventions SDOH Screenings   Tobacco Use: Low Risk  (12/24/2022)     Readmission Risk Interventions    01/16/2023    2:53 PM  Readmission Risk Prevention Plan  Transportation Screening Complete  PCP or Specialist Appt within 5-7 Days Complete  Home Care Screening Complete  Medication Review (RN CM) Complete

## 2023-01-17 NOTE — Discharge Summary (Signed)
Physician Discharge Summary   Patient: Amy [name] MRN: [id number] DOB: [birth year]  Admit date:     01/12/2023  Discharge date: 01/17/23  Discharge Physician: Fran Lowes   PCP: Georgann Housekeeper, MD   Recommendations at discharge:    Discharge to [facility] for end of life care.  Discharge Diagnoses: Principal Problem:   Septic shock (HCC) Active Problems:   Protein-calorie malnutrition, severe   AKI (acute kidney injury) (HCC)   Hypernatremia   Pressure injury of skin   Dysphagia  Resolved Problems:   * No resolved hospital problems. *  Hospital Course: ecently had a hip fracture for which she had surgery discharged from the hospital to skilled nursing facility,  Concern for urinary tract infection recently, started on antibiotics. The patient has not been eating or drinking much. She presented to the hospital with severe lethargy.    She was admitted to the ICU for septic shock requiring levophed. She also had severe hypernatremia, AKI, Hypotension, hypokalemia, hypophosphatemia, failure to thrive, and UTI. In the ICU she received free water infusions, pain control, and trickle tube feeds. SLP has evaluated her swallow and she is felt to be at high risk of aspiration. She is receiving nutrition by tube feeds and hydration by fluid bolus by tube.   She was transferred out to the floor on 01/15/2023. Her family has met with palliative care. She is now comfort care. The family would like the patient to go to [facility]. The patient is awaiting approval. Apparently [facility] feels that they will have a bed for the patient today.   Assessment and Plan: * Septic shock (HCC) Due to UTI. Urine culture resulted on 01/13/2023 has grown out E. Coli and Proteus mirabilis. The patient received ceftriaxone and vancomycin for this. These have now been stopped. The patient is comfort care only.  Dysphagia The patient is deemed too high risk for PO intake. She is now comfort care  only.  Pressure injury of skin The patient has a stage 1 ulcer on the left ischial tuberosity, an unstagable wound to the sacrum with full thickness tissue loss., and two blisters on the anterior of the right foot. She is now comfort care only.  Hypernatremia Due to patient with inadequate intake of water prior to admission. This has been resolved with few water boluses by tube and IV fluids. Her sodium upon admission was 160. Her last labs on the morning of 01/15/2023 showed a sodium of 140. She is now comfort care only.   AKI (acute kidney injury) (HCC) Resolved. Caused by inadequate PO intake of fluids prior to admission. She presented with a creatinine of 2.81. It has now corrected to 0.55 as of her last lab on 01/15/2023. She is now comfort care only.  Protein-calorie malnutrition, severe Noted. The patient has had very poor PO intake. She is deemed to be at high risk for aspiration. She is NPO for this reason. She had been receiving osmolite 1.2 cal with free water per tube. This has now been stopped as she is comfort care.         Consultants: Orthopedic Surgery, Palliative Care, Wound care, TOC Procedures performed: Right hip cephalomedullary nailing   Disposition: Hospice care Diet recommendation:  NPO   DISCHARGE MEDICATION: Allergies as of 01/17/2023       Reactions   Ciprofloxacin Rash        Medication List     STOP taking these medications    amantadine 100 MG capsule Commonly  known as: SYMMETREL   aspirin EC 81 MG tablet   bisacodyl 10 MG/30ML Enem Commonly known as: FLEET   carbidopa-levodopa 25-100 MG tablet Commonly known as: SINEMET IR   ENEMA RE   magnesium hydroxide 400 MG/5ML suspension Commonly known as: MILK OF MAGNESIA   nitrofurantoin (macrocrystal-monohydrate) 100 MG capsule Commonly known as: MACROBID   oxyCODONE 5 MG immediate release tablet Commonly known as: Oxy IR/ROXICODONE   pramipexole 1 MG tablet Commonly known as:  MIRAPEX   saccharomyces boulardii 250 MG capsule Commonly known as: FLORASTOR   sodium chloride 0.45 %       TAKE these medications    acetaminophen 325 MG tablet Commonly known as: TYLENOL Take 650 mg by mouth every 6 (six) hours as needed for moderate pain (pain score 4-6).   Gerhardt's butt cream Crea Apply 1 Application topically 2 (two) times daily.   LORazepam 2 MG/ML concentrated solution Commonly known as: ATIVAN Place 0.5 mLs (1 mg total) under the tongue every 4 (four) hours as needed for anxiety.   morphine 20 MG/ML concentrated solution Commonly known as: ROXANOL Take 0.25 mLs (5 mg total) by mouth every 2 (two) hours as needed for severe pain (pain score 7-10).   polyethylene glycol 17 g packet Commonly known as: MIRALAX / GLYCOLAX Take 17 g by mouth daily as needed for moderate constipation.   Synthroid 75 MCG tablet Generic drug: levothyroxine Take 75 mcg by mouth daily before breakfast.               Discharge Care Instructions  (From admission, onward)           Start     Ordered   01/17/23 0000  Discharge wound care:       Comments: Wound care  Daily      Comments: 1.        Clean R lateral foot wounds with NS, apply Xeroform gauze [place] [phone]) to wound beds daily, cover with dry gauze and Kerlix roll gauze. Place R foot in Prevalon boot to offload pressure Hart [place] [phone]) while in bed.  Remove for ambulation.  2.         Clean Sacrum with soap and water, apply Xeroform gauze to wound bed (purple maroon discoloration) daily.  Cover surrounding skin to sacrum and buttocks with Gerhardt's Butt Cream 2 times daily and prn soiling   01/17/23 1322            Discharge Exam: Filed Weights   01/13/23 0500 01/14/23 0500 01/15/23 0500  Weight: 42.6 kg 42.9 kg 43.4 kg   Exam:  Constitutional:  The patient is somnolent, moribund once awakened. No acute distress. Respiratory:  No increased work of breathing. No wheezes, rales, or  rhonchi No tactile fremitus Cardiovascular:  Regular rate and rhythm No murmurs, ectopy, or gallups. No lateral PMI. No thrills. Abdomen:  Abdomen is soft, non-tender, non-distended No hernias, masses, or organomegaly Normoactive bowel sounds.  Musculoskeletal:  No cyanosis, clubbing, or edema Skin:  No rashes, lesions, ulcers palpation of skin: no induration or nodules Neurologic:  CN 2-12 intact Sensation all 4 extremities intact Psychiatric:  Mental status Mood, affect appropriate Orientation to person, place, time  judgment and insight appear intact   Condition at discharge: fair  The results of significant diagnostics from this hospitalization (including imaging, microbiology, ancillary and laboratory) are listed below for reference.   Imaging Studies: DG Abd Portable 1V  Result Date: 01/13/2023 CLINICAL DATA:  Feeding tube placement. EXAM: PORTABLE  ABDOMEN - 1 VIEW COMPARISON:  CT yesterday FINDINGS: Tip of the weighted enteric tube in the right upper quadrant in the region of the distal stomach or proximal duodenum nonobstructive upper abdominal bowel gas pattern. IMPRESSION: Tip of the weighted enteric tube in the right upper quadrant in the region of the distal stomach or proximal duodenum. Electronically Signed   By: Narda Rutherford M.D.   On: 01/13/2023 17:15   CT ABDOMEN PELVIS WO CONTRAST  Result Date: 01/12/2023 CLINICAL DATA:  Sepsis. Air from introitus on direct pelvic pressure. Recent right hip fracture fixation. EXAM: CT ABDOMEN AND PELVIS WITHOUT CONTRAST TECHNIQUE: Multidetector CT imaging of the abdomen and pelvis was performed following the standard protocol without IV contrast. RADIATION DOSE REDUCTION: This exam was performed according to the departmental dose-optimization program which includes automated exposure control, adjustment of the mA and/or kV according to patient size and/or use of iterative reconstruction technique. COMPARISON:  Chest  radiographs 01/12/2023. Abdominal radiographs 07/10/2019. Cardiac CT 08/13/2018. FINDINGS: Despite efforts by the technologist and patient, mild motion artifact is present on today's exam and could not be eliminated. This reduces exam sensitivity and specificity. Lower chest: Clear lung bases. No significant pleural or pericardial effusion. Hepatobiliary: 4.4 cm cyst posteriorly in the dome of the right hepatic lobe has enlarged from the previous cardiac CT, although demonstrates no aggressive characteristics on noncontrast imaging. There is a calcified granuloma anteriorly in the right lobe. Patient is status post remote cholecystectomy with a low-density collection in the cholecystectomy bed, measuring 3.7 x 2.6 cm on image 21/3, not previously imaged. No evidence of biliary dilatation or pneumobilia. Pancreas:  Not well visualized.  No focal abnormality identified. Spleen: Normal in size without focal abnormality. Adrenals/Urinary Tract: Both adrenal glands appear normal. No evidence of urinary tract calculus, suspicious renal lesion or hydronephrosis. There is no air within the renal collecting systems or ureters. However, there is a large amount of air within the urinary bladder lumen. There is a small amount of high density material dependently in the right bladder lumen. No bladder wall thickening, surrounding inflammation or obvious colovesical fistula identified. Stomach/Bowel: No enteric contrast administered. The stomach appears unremarkable for its degree of distention. No small bowel distension, wall thickening or surrounding inflammation identified. There is a large amount of high density stool throughout the colon, especially within the rectum. No rectal wall thickening or surrounding inflammation is seen. As above, no obvious colovesical fistula. There are some sigmoid colon diverticular changes. Vascular/Lymphatic: There are no enlarged abdominal or pelvic lymph nodes. Aortic and branch vessel  atherosclerosis without evidence of aneurysm. Reproductive: Uterine atrophy or surgical absence. No suspicious adnexal findings. Other: No evidence of abdominal wall mass or hernia. No ascites or pneumoperitoneum. Musculoskeletal: Postsurgical changes from recent right femoral ORIF for an intertrochanteric fracture. The right hip is abducted. Age-indeterminate T12 compression fracture appears new from 2020 and is associated with mild osseous retropulsion and possible mild paraspinal edema. No other acute osseous findings. There is mild multilevel spondylosis. IMPRESSION: 1. Large amount of air within the urinary bladder lumen, nonspecific but suggestive of a colovesical fistula in this clinical context. No bladder wall thickening, surrounding inflammation or obvious colovesical fistula identified. 2. Large amount of high density stool throughout the colon, especially within the rectum, consistent with constipation. No evidence of bowel obstruction or inflammation. 3. Age-indeterminate T12 compression fracture, new from 2020. Correlate with any pain at this level. 4. Status post remote cholecystectomy with a low-density collection in the cholecystectomy bed,  not previously imaged. This could reflect a postoperative seroma or biloma. 5. Postsurgical changes from recent right femoral ORIF for an intertrochanteric fracture. 6.  Aortic Atherosclerosis (ICD10-I70.0). Electronically Signed   By: Carey Bullocks M.D.   On: 01/12/2023 16:26   DG Chest Port 1 View  Result Date: 01/12/2023 CLINICAL DATA:  Sepsis EXAM: PORTABLE CHEST 1 VIEW COMPARISON:  12/23/2022 FINDINGS: Marked patient rotation limits the exam. No acute airspace disease or pleural effusion. Stable cardiomediastinal silhouette. No pneumothorax IMPRESSION: No active disease. Marked patient rotation limits the exam. Electronically Signed   By: Jasmine Pang M.D.   On: 01/12/2023 15:27   DG FEMUR, MIN 2 VIEWS RIGHT  Result Date: 12/24/2022 CLINICAL  DATA:  Elective surgery, femoral intramedullary nail. EXAM: RIGHT FEMUR 2 VIEWS COMPARISON:  Preoperative imaging FINDINGS: Four fluoroscopic spot views of the right femur obtained in the operating room. Femoral intramedullary nail with distal and trans trochanteric screw fixation of proximal femur fracture. Improved fracture alignment from preoperative imaging. Fluoroscopy time 1 minutes 45 seconds. Dose 9.32 mGy. IMPRESSION: Intraoperative fluoroscopy during ORIF of proximal femur fracture. Electronically Signed   By: Narda Rutherford M.D.   On: 12/24/2022 19:37   DG C-Arm 1-60 Min-No Report  Result Date: 12/24/2022 Fluoroscopy was utilized by the requesting physician.  No radiographic interpretation.   CT CERVICAL SPINE WO CONTRAST  Result Date: 12/23/2022 CLINICAL DATA:  Status post trauma. EXAM: CT CERVICAL SPINE WITHOUT CONTRAST TECHNIQUE: Multidetector CT imaging of the cervical spine was performed without intravenous contrast. Multiplanar CT image reconstructions were also generated. RADIATION DOSE REDUCTION: This exam was performed according to the departmental dose-optimization program which includes automated exposure control, adjustment of the mA and/or kV according to patient size and/or use of iterative reconstruction technique. COMPARISON:  None Available. FINDINGS: Alignment: Normal. Skull base and vertebrae: No acute fracture. Degenerative changes are seen involving the tip of the dens and the adjacent portion of the anterior arch of C1. Chronic appearing sclerotic changes are noted throughout the C5 vertebral body with a small osseous hemangioma suspected within the C6 vertebral body. Soft tissues and spinal canal: No prevertebral fluid or swelling. No visible canal hematoma. Disc levels: Marked severity endplate sclerosis, anterior osteophyte formation and posterior bony spurring are seen at the levels of C5-C6 and C6-C7. There is marked severity narrowing of the anterior atlantoaxial  articulation, with marked severity intervertebral disc space narrowing seen at the levels of C5-C6 and C6-C7. Bilateral marked severity multilevel facet joint hypertrophy is noted. Upper chest: There is moderate severity bilateral apical scarring and/or atelectasis. Other: None. IMPRESSION: 1. Marked severity multilevel degenerative changes, most prominent at the levels of C5-C6 and C6-C7. 2. No acute cervical spine fracture or subluxation. Electronically Signed   By: Aram Candela M.D.   On: 12/23/2022 22:49   CT HEAD WO CONTRAST  Result Date: 12/23/2022 CLINICAL DATA:  Status post trauma. EXAM: CT HEAD WITHOUT CONTRAST TECHNIQUE: Contiguous axial images were obtained from the base of the skull through the vertex without intravenous contrast. RADIATION DOSE REDUCTION: This exam was performed according to the departmental dose-optimization program which includes automated exposure control, adjustment of the mA and/or kV according to patient size and/or use of iterative reconstruction technique. COMPARISON:  None Available. FINDINGS: Brain: There is mild cerebral atrophy with widening of the extra-axial spaces and ventricular dilatation. There are areas of decreased attenuation within the white matter tracts of the supratentorial brain, consistent with microvascular disease changes. Vascular: No hyperdense vessel or unexpected  calcification. Skull: Normal. Negative for fracture or focal lesion. Sinuses/Orbits: No acute finding. Other: None. IMPRESSION: 1. Generalized cerebral atrophy with chronic white matter small vessel ischemic changes. 2. No acute intracranial abnormality. Electronically Signed   By: Aram Candela M.D.   On: 12/23/2022 22:45   DG Pelvis Portable  Result Date: 12/23/2022 CLINICAL DATA:  Status post trauma. EXAM: PORTABLE PELVIS 1-2 VIEWS COMPARISON:  None Available. FINDINGS: An acute, comminuted fracture is seen extending through the inter trochanteric region of the proximal  right femur. There is no evidence of dislocation. No pelvic bone lesions are seen. IMPRESSION: Acute intertrochanteric fracture of the proximal right femur. Electronically Signed   By: Aram Candela M.D.   On: 12/23/2022 22:43   DG FEMUR PORT, MIN 2 VIEWS RIGHT  Result Date: 12/23/2022 CLINICAL DATA:  Status post trauma. EXAM: RIGHT FEMUR PORTABLE 2 VIEW COMPARISON:  None Available. FINDINGS: An acute, comminuted fracture deformity is seen extending through the inter trochanteric region and proximal shaft of the right femur. There is no evidence of dislocation. Soft tissues are unremarkable. IMPRESSION: Acute fracture of the proximal right femur. Electronically Signed   By: Aram Candela M.D.   On: 12/23/2022 22:42   DG Chest Port 1 View  Result Date: 12/23/2022 CLINICAL DATA:  Status post trauma. EXAM: PORTABLE CHEST 1 VIEW COMPARISON:  Jul 29, 2018 FINDINGS: The heart size and mediastinal contours are within normal limits. The lungs are hyperinflated with mild, diffuse chronic appearing increased lung markings noted. There is no evidence of an acute infiltrate, pleural effusion or pneumothorax. Multilevel degenerative changes seen throughout the thoracic spine. IMPRESSION: Chronic appearing increased lung markings without evidence of acute or active cardiopulmonary disease. Electronically Signed   By: Aram Candela M.D.   On: 12/23/2022 22:42    Microbiology: Results for orders placed or performed during the hospital encounter of 01/12/23  Culture, blood (Routine x 2)     Status: None   Collection Time: 01/12/23  2:22 PM   Specimen: BLOOD  Result Value Ref Range Status   Specimen Description BLOOD LEFT ANTECUBITAL  Final   Special Requests   Final    BOTTLES DRAWN AEROBIC AND ANAEROBIC Blood Culture adequate volume   Culture   Final    NO GROWTH 5 DAYS Performed at [facility], [address] [place], [place] [address]    Report Status 01/17/2023 FINAL  Final  Urine  Culture     Status: Abnormal   Collection Time: 01/12/23  2:22 PM   Specimen: Urine, Random  Result Value Ref Range Status   Specimen Description URINE, RANDOM  Final   Special Requests   Final    NONE Reflexed from [id number] Performed at [facility], [address] [place], [place] [address]    Culture (A)  Final    >=100,000 COLONIES/mL PROTEUS MIRABILIS >=100,000 COLONIES/mL ESCHERICHIA COLI    Report Status 01/16/2023 FINAL  Final   Organism ID, Bacteria PROTEUS MIRABILIS (A)  Final   Organism ID, Bacteria ESCHERICHIA COLI (A)  Final      Susceptibility   Escherichia coli - MIC*    AMPICILLIN >=32 RESISTANT Resistant     CEFAZOLIN 16 SENSITIVE Sensitive     CEFEPIME <=0.12 SENSITIVE Sensitive     CEFTRIAXONE 1 SENSITIVE Sensitive     CIPROFLOXACIN <=0.25 SENSITIVE Sensitive     GENTAMICIN <=1 SENSITIVE Sensitive     IMIPENEM <=0.25 SENSITIVE Sensitive     NITROFURANTOIN 32 SENSITIVE  Sensitive     TRIMETH/SULFA <=20 SENSITIVE Sensitive     AMPICILLIN/SULBACTAM 16 INTERMEDIATE Intermediate     PIP/TAZO <=4 SENSITIVE Sensitive ug/mL    * >=100,000 COLONIES/mL ESCHERICHIA COLI   Proteus mirabilis - MIC*    AMPICILLIN <=2 SENSITIVE Sensitive     CEFAZOLIN 8 SENSITIVE Sensitive     CEFEPIME <=0.12 SENSITIVE Sensitive     CEFTRIAXONE <=0.25 SENSITIVE Sensitive     CIPROFLOXACIN <=0.25 SENSITIVE Sensitive     GENTAMICIN <=1 SENSITIVE Sensitive     IMIPENEM 2 SENSITIVE Sensitive     NITROFURANTOIN 128 RESISTANT Resistant     TRIMETH/SULFA <=20 SENSITIVE Sensitive     AMPICILLIN/SULBACTAM <=2 SENSITIVE Sensitive     PIP/TAZO <=4 SENSITIVE Sensitive ug/mL    * >=100,000 COLONIES/mL PROTEUS MIRABILIS  Culture, blood (Routine x 2)     Status: Abnormal   Collection Time: 01/12/23  2:27 PM   Specimen: BLOOD  Result Value Ref Range Status   Specimen Description BLOOD RIGHT ANTECUBITAL  Final   Special Requests   Final    BOTTLES DRAWN AEROBIC ONLY Blood Culture results may  not be optimal due to an inadequate volume of blood received in culture bottles   Culture  Setup Time   Final    GRAM POSITIVE COCCI IN CLUSTERS AEROBIC BOTTLE ONLY CRITICAL RESULT CALLED TO, READ BACK BY AND VERIFIED WITH: PHARMD J.FRENS AT 4098 ON 01/14/2023 BY T.SAAD.    Culture (A)  Final    STAPHYLOCOCCUS EPIDERMIDIS THE SIGNIFICANCE OF ISOLATING THIS ORGANISM FROM A SINGLE SET OF BLOOD CULTURES WHEN MULTIPLE SETS ARE DRAWN IS UNCERTAIN. PLEASE NOTIFY THE MICROBIOLOGY DEPARTMENT WITHIN ONE WEEK IF SPECIATION AND SENSITIVITIES ARE REQUIRED. Performed at [facility], [address], [place], [place] [address]    Report Status 01/15/2023 FINAL  Final  Blood Culture ID Panel (Reflexed)     Status: Abnormal   Collection Time: 01/12/23  2:27 PM  Result Value Ref Range Status   Enterococcus faecalis NOT DETECTED NOT DETECTED Final   Enterococcus Faecium NOT DETECTED NOT DETECTED Final   Listeria monocytogenes NOT DETECTED NOT DETECTED Final   Staphylococcus species DETECTED (A) NOT DETECTED Final    Comment: CRITICAL RESULT CALLED TO, READ BACK BY AND VERIFIED WITH: PHARMD J.FRENS AT 7829 ON 01/14/2023 BY T.SAAD.    Staphylococcus aureus (BCID) NOT DETECTED NOT DETECTED Final   Staphylococcus epidermidis DETECTED (A) NOT DETECTED Final    Comment: Methicillin (oxacillin) resistant coagulase negative staphylococcus. Possible blood culture contaminant (unless isolated from more than one blood culture draw or clinical case suggests pathogenicity). No antibiotic treatment is indicated for blood  culture contaminants. CRITICAL RESULT CALLED TO, READ BACK BY AND VERIFIED WITH: PHARMD J.FRENS AT 5621 ON 01/14/2023 BY T.SAAD.    Staphylococcus lugdunensis NOT DETECTED NOT DETECTED Final   Streptococcus species NOT DETECTED NOT DETECTED Final   Streptococcus agalactiae NOT DETECTED NOT DETECTED Final   Streptococcus pneumoniae NOT DETECTED NOT DETECTED Final   Streptococcus pyogenes NOT  DETECTED NOT DETECTED Final   A.calcoaceticus-baumannii NOT DETECTED NOT DETECTED Final   Bacteroides fragilis NOT DETECTED NOT DETECTED Final   Enterobacterales NOT DETECTED NOT DETECTED Final   Enterobacter cloacae complex NOT DETECTED NOT DETECTED Final   Escherichia coli NOT DETECTED NOT DETECTED Final   Klebsiella aerogenes NOT DETECTED NOT DETECTED Final   Klebsiella oxytoca NOT DETECTED NOT DETECTED Final   Klebsiella pneumoniae NOT DETECTED NOT DETECTED Final   Proteus species NOT DETECTED NOT DETECTED  Final   Salmonella species NOT DETECTED NOT DETECTED Final   Serratia marcescens NOT DETECTED NOT DETECTED Final   Haemophilus influenzae NOT DETECTED NOT DETECTED Final   Neisseria meningitidis NOT DETECTED NOT DETECTED Final   Pseudomonas aeruginosa NOT DETECTED NOT DETECTED Final   Stenotrophomonas maltophilia NOT DETECTED NOT DETECTED Final   Candida albicans NOT DETECTED NOT DETECTED Final   Candida auris NOT DETECTED NOT DETECTED Final   Candida glabrata NOT DETECTED NOT DETECTED Final   Candida krusei NOT DETECTED NOT DETECTED Final   Candida parapsilosis NOT DETECTED NOT DETECTED Final   Candida tropicalis NOT DETECTED NOT DETECTED Final   Cryptococcus neoformans/gattii NOT DETECTED NOT DETECTED Final   Methicillin resistance mecA/C DETECTED (A) NOT DETECTED Final    Comment: CRITICAL RESULT CALLED TO, READ BACK BY AND VERIFIED WITH: PHARMD J.FRENS AT 4098 ON 01/14/2023 BY T.SAAD. Performed at [facility], [address] [place], [place] [address]   MRSA Next Gen by PCR, Nasal     Status: None   Collection Time: 01/12/23  6:55 PM   Specimen: Nasal Mucosa; Nasal Swab  Result Value Ref Range Status   MRSA by PCR Next Gen NOT DETECTED NOT DETECTED Final    Comment: (NOTE) The GeneXpert MRSA Assay (FDA approved for NASAL specimens only), is one component of a comprehensive MRSA colonization surveillance program. It is not intended to diagnose MRSA infection nor  to guide or monitor treatment for MRSA infections. Test performance is not FDA approved in patients less than 71 years old. Performed at [facility], [address] [place], [place] [address]     Labs: CBC: Recent Labs  Lab 01/12/23 1422 01/12/23 1740 01/13/23 0712 01/14/23 0340  WBC 19.5* 25.3* 20.6* 16.7*  NEUTROABS 16.6* 21.7*  --  13.8*  HGB 10.8* 13.8 11.0* 10.3*  HCT 38.5 48.0* 36.1 34.1*  MCV 102.1* 98.0 94.5 93.9  PLT 260 315 268 221   Basic Metabolic Panel: Recent Labs  Lab 01/13/23 1520 01/13/23 2125 01/14/23 0340 01/14/23 0939 01/14/23 0940 01/14/23 1738 01/15/23 0730 01/15/23 0731  NA 163* 156* 149*  --  144  --   --  140  K 3.5 3.3* 3.6  --  3.8  --   --  3.5  CL 130* 126* 122*  --  114*  --   --  107  CO2 21* 22 20*  --  20*  --   --  25  GLUCOSE 140* 192* 214*  --  204*  --   --  128*  BUN 69* 61* 39*  --  29*  --   --  14  CREATININE 1.24* 1.05* 0.89  --  0.73  --   --  0.55  CALCIUM 7.8* 7.8* 7.4*  --  7.3*  --   --  7.6*  MG  --   --  2.2 1.9  --  1.7 1.7  --   PHOS  --   --  1.7*  --   --  2.8 2.8  --    Liver Function Tests: Recent Labs  Lab 01/12/23 1556 01/12/23 1740  AST 58* 60*  ALT 21 24  ALKPHOS 174* 200*  BILITOT 1.6* 1.5*  PROT 6.3* 6.2*  ALBUMIN 2.7* 2.8*   CBG: Recent Labs  Lab 01/14/23 1525 01/14/23 1948 01/14/23 2357 01/15/23 0629 01/15/23 1139  GLUCAP 133* 110* 122* 124* 149*    Discharge time spent: greater than 30 minutes.  Signed: Emorie Mcfate  Skylur Fuston, DO [facility] 01/17/2023

## 2023-01-17 NOTE — Progress Notes (Addendum)
Report given to Tiffany at [facility].

## 2023-01-17 NOTE — Progress Notes (Signed)
  Palliative Medicine Inpatient Follow Up Note HPI: 77 year old woman admitted with AKI and severe hyponatremia with a history of decreased p.o. intake. Recently had a hip fracture for which she had surgery discharged from the hospital to skilled nursing facility.   Palliative care has been asked to assist with additional goals of care conversations.   Today's Discussion 01/17/2023  *Please note that this is a verbal dictation therefore any spelling or grammatical errors are due to the "[employer]" system interpretation.  Chart reviewed inclusive of vital signs, progress notes, laboratory results, and diagnostic images. Discussed with RN. No family present during my visit.  Patient assessed at the bedside. She is tachypneic with regular open-mouth breaths. She tracks my voice. Per RN, she has seemed anxious throughout the day and not restful. Recommended additional dose of PRN Fentanyl. RN also to try PRN haldol.   Questions and concerns addressed/Palliative Support Provided.   Objective Assessment: Vital Signs Vitals:   01/16/23 1951 01/17/23 0737  BP: (!) 86/51 (!) 97/49  Pulse: 85 89  Resp: 18 (!) 22  Temp: 97.7 F (36.5 C) 98.5 F (36.9 C)  SpO2: 97% 100%    Gen:  Frail elderly acutely ill appearing F HEENT: dry mucous membranes CV: Regular rate PULM: On RA, tachypneic with mild respiratory distress ABD: soft/nontender  EXT: (+) contractures of BUE, onychomycosis of nails Neuro: Opens eyes though not responsive  SUMMARY OF RECOMMENDATIONS   -Continue DNAR/DNI -Continue Comfort Care -Increased dose and frequency of fentanyl ATC to Q3H, continue PRN for breakthrough pain/dyspnea -Additional comfort medications per [facility] -Await eligibility review for referral to [facility] -Ongoing PMT support  Billing based on MDM: High ______________________________________________________________________________________ Richardson Dopp, PA-C [facility]  [facility] Team Team Cell Phone: [phone] Please utilize secure chat with additional questions, if there is no response within 30 minutes please call the above phone number  Palliative Medicine Team providers are available by phone from 7am to 7pm daily and can be reached through the team cell phone.  Should this patient require assistance outside of these hours, please call the patient's attending physician.

## 2023-02-09 DEATH — deceased

## 2023-03-28 ENCOUNTER — Other Ambulatory Visit: Payer: Self-pay | Admitting: Neurology
